# Patient Record
Sex: Male | Born: 1960 | Race: White | Hispanic: No | Marital: Married | Smoking: Never smoker
Health system: Southern US, Community
[De-identification: ages and names within clinical notes are randomized; demographics above are authoritative.]

## PROBLEM LIST (undated history)

## (undated) DIAGNOSIS — Z5189 Encounter for other specified aftercare: Secondary | ICD-10-CM

## (undated) DIAGNOSIS — Z87442 Personal history of urinary calculi: Secondary | ICD-10-CM

## (undated) DIAGNOSIS — M1990 Unspecified osteoarthritis, unspecified site: Secondary | ICD-10-CM

## (undated) DIAGNOSIS — S060XAA Concussion with loss of consciousness status unknown, initial encounter: Secondary | ICD-10-CM

## (undated) DIAGNOSIS — K5790 Diverticulosis of intestine, part unspecified, without perforation or abscess without bleeding: Secondary | ICD-10-CM

## (undated) DIAGNOSIS — N200 Calculus of kidney: Secondary | ICD-10-CM

## (undated) DIAGNOSIS — K219 Gastro-esophageal reflux disease without esophagitis: Secondary | ICD-10-CM

## (undated) DIAGNOSIS — T8859XA Other complications of anesthesia, initial encounter: Secondary | ICD-10-CM

## (undated) DIAGNOSIS — I639 Cerebral infarction, unspecified: Secondary | ICD-10-CM

## (undated) DIAGNOSIS — D649 Anemia, unspecified: Secondary | ICD-10-CM

## (undated) DIAGNOSIS — K254 Chronic or unspecified gastric ulcer with hemorrhage: Secondary | ICD-10-CM

## (undated) DIAGNOSIS — T7840XA Allergy, unspecified, initial encounter: Secondary | ICD-10-CM

## (undated) HISTORY — DX: Encounter for other specified aftercare: Z51.89

## (undated) HISTORY — PX: UPPER GASTROINTESTINAL ENDOSCOPY: SHX188

## (undated) HISTORY — DX: Gastro-esophageal reflux disease without esophagitis: K21.9

## (undated) HISTORY — PX: COLONOSCOPY: SHX174

## (undated) HISTORY — PX: KIDNEY STONE SURGERY: SHX686

## (undated) HISTORY — DX: Diverticulosis of intestine, part unspecified, without perforation or abscess without bleeding: K57.90

## (undated) HISTORY — PX: APPENDECTOMY: SHX54

## (undated) HISTORY — PX: OTHER SURGICAL HISTORY: SHX169

## (undated) HISTORY — PX: OPEN ANTERIOR SHOULDER RECONSTRUCTION: SHX2100

## (undated) HISTORY — PX: FINGER FRACTURE SURGERY: SHX638

## (undated) HISTORY — PX: WISDOM TOOTH EXTRACTION: SHX21

## (undated) HISTORY — DX: Allergy, unspecified, initial encounter: T78.40XA

---

## 1990-10-07 DIAGNOSIS — Z9289 Personal history of other medical treatment: Secondary | ICD-10-CM

## 1990-10-07 HISTORY — DX: Personal history of other medical treatment: Z92.89

## 2009-08-07 HISTORY — PX: NASAL SINUS SURGERY: SHX719

## 2009-10-07 DIAGNOSIS — I639 Cerebral infarction, unspecified: Secondary | ICD-10-CM

## 2009-10-07 HISTORY — DX: Cerebral infarction, unspecified: I63.9

## 2015-08-21 DIAGNOSIS — N200 Calculus of kidney: Secondary | ICD-10-CM | POA: Insufficient documentation

## 2016-05-23 ENCOUNTER — Encounter: Payer: Self-pay | Admitting: *Deleted

## 2016-05-23 ENCOUNTER — Emergency Department
Admission: EM | Admit: 2016-05-23 | Discharge: 2016-05-23 | Disposition: A | Discharge status: Home / Self Care | Payer: BLUE CROSS/BLUE SHIELD | Source: Home / Self Care

## 2016-05-23 DIAGNOSIS — L740 Miliaria rubra: Secondary | ICD-10-CM

## 2016-05-23 DIAGNOSIS — R21 Rash and other nonspecific skin eruption: Secondary | ICD-10-CM | POA: Diagnosis not present

## 2016-05-23 HISTORY — DX: Calculus of kidney: N20.0

## 2016-05-23 HISTORY — DX: Chronic or unspecified gastric ulcer with hemorrhage: K25.4

## 2016-05-23 HISTORY — DX: Cerebral infarction, unspecified: I63.9

## 2016-05-23 MED ORDER — PREDNISONE 20 MG PO TABS: ORAL_TABLET | ORAL | 0 refills | Status: DC

## 2016-05-23 MED ORDER — ERYTHROMYCIN 2 % EX OINT: TOPICAL_OINTMENT | CUTANEOUS | 0 refills | Status: DC

## 2016-05-23 NOTE — ED Provider Notes (Signed)
CSN: [id number]     Arrival date & time 05/23/16  0804 History   First MD Initiated Contact with Patient 05/23/16 [phone]     Chief Complaint  Patient presents with  . Rash   (Consider location/radiation/quality/duration/timing/severity/associated sxs/prior Treatment) HPI  Tyler Obrien is a 55 y.o. male presenting to UC with c/o mild to moderately pruritic rash that started on his trunk about 9 days ago.  He reports hx of similar rash on trunk in the past, usually resolves with OTC Gold Bond powder and benadryl but no relief this time. Reports mild intermittent headache but denies n/v/d. Denies fever or chills. No new soaps lotions or medications. He notes the rash is typically triggered by very hot or very dry months of the year.    Past Medical History:  Diagnosis Date  . Bleeding gastric ulcer   . CVA (cerebral infarction)   . Kidney stone    Past Surgical History:  Procedure Laterality Date  . APPENDECTOMY    . KIDNEY STONE SURGERY    . OPEN ANTERIOR SHOULDER RECONSTRUCTION     Family History  Problem Relation Age of Onset  . Cancer Father     Lung CA  . Hyperlipidemia Father   . Hypertension Father    Social History  Substance Use Topics  . Smoking status: Never Smoker  . Smokeless tobacco: Never Used  . Alcohol use Yes    Review of Systems  Constitutional: Negative for chills and fever.  Gastrointestinal: Negative for nausea and vomiting.  Musculoskeletal: Negative for arthralgias, joint swelling and myalgias.  Skin: Positive for rash. Negative for wound.    Allergies  Review of patient's allergies indicates no known allergies.  Home Medications   Prior to Admission medications   Medication Sig Start Date End Date Taking? Authorizing Provider  fluticasone (FLONASE) 50 MCG/ACT nasal spray Place into both nostrils daily.   Yes Historical Provider, MD  Multiple Vitamin (MULTIVITAMIN) tablet Take 1 tablet by mouth daily.   Yes Historical Provider, MD   Erythromycin 2 % ointment Apply to affected area 2 times daily 05/23/16   Junius FinnerErin O'Malley, PA-C  predniSONE (DELTASONE) 20 MG tablet 3 tabs po day one, then 2 po daily x 4 days 05/23/16   Junius FinnerErin O'Malley, PA-C   Meds Ordered and Administered this Visit  Medications - No data to display  BP 133/91 (BP Location: Left Arm)   Pulse 73   Temp 97.8 F (36.6 C) (Oral)   Resp 14   Ht 5\' 9"  (1.753 m)   Wt 183 lb (83 kg)   SpO2 96%   BMI 27.02 kg/m  No data found.   Physical Exam  Constitutional: He is oriented to person, place, and time. He appears well-developed and well-nourished.  HENT:  Head: Normocephalic and atraumatic.  Mouth/Throat: Oropharynx is clear and moist.  Cardiovascular: Normal rate.   Pulmonary/Chest: Effort normal. No respiratory distress.  Musculoskeletal: Normal range of motion.  Neurological: He is alert and oriented to person, place, and time.  Skin: Skin is warm and dry. Rash noted. There is erythema.  Trunk and back: diffuse erythematous 3mm papular rash. Rash does blanch. No bleeding or discharge. Non-tender. No surrounding erythema, edema, induration or fluctuance.   Psychiatric: He has a normal mood and affect. His behavior is normal.  Nursing note and vitals reviewed.   Urgent Care Course   Clinical Course    Procedures (including critical care time)  Labs Review Labs Reviewed - No data to display  Imaging Review No results found.   MDM   1. Rash and nonspecific skin eruption   2. Heat rash    Rash most c/w heat rash.  Rx: Prednisone and erythromycin cream  Encouraged to keep rash clean with soap and water. Home instructions provided. F/u with PCP in 1 week if not improving, sooner if worsening. Patient verbalized understanding and agreement with treatment plan.     Junius FinnerErin O'Malley, PA-C 05/23/16 [id number]

## 2016-05-23 NOTE — ED Triage Notes (Signed)
Pt c/o pruritic rash to trunk x 9 days. H/o same rash in the past that resolved with otc meds, not working this time. USed gold bond powder and Benadryl. Also c/o a few HAs since rash started.

## 2016-08-26 ENCOUNTER — Ambulatory Visit (INDEPENDENT_AMBULATORY_CARE_PROVIDER_SITE_OTHER): Payer: BLUE CROSS/BLUE SHIELD | Admitting: Osteopathic Medicine

## 2016-08-26 ENCOUNTER — Encounter: Payer: Self-pay | Admitting: Osteopathic Medicine

## 2016-08-26 VITALS — BP 131/86 | HR 67 | Ht 69.0 in | Wt 173.0 lb

## 2016-08-26 DIAGNOSIS — R3989 Other symptoms and signs involving the genitourinary system: Secondary | ICD-10-CM | POA: Diagnosis not present

## 2016-08-26 DIAGNOSIS — N200 Calculus of kidney: Secondary | ICD-10-CM

## 2016-08-26 DIAGNOSIS — L729 Follicular cyst of the skin and subcutaneous tissue, unspecified: Secondary | ICD-10-CM | POA: Diagnosis not present

## 2016-08-26 DIAGNOSIS — R0981 Nasal congestion: Secondary | ICD-10-CM

## 2016-08-26 DIAGNOSIS — R718 Other abnormality of red blood cells: Secondary | ICD-10-CM

## 2016-08-26 DIAGNOSIS — J322 Chronic ethmoidal sinusitis: Secondary | ICD-10-CM

## 2016-08-26 DIAGNOSIS — Z13220 Encounter for screening for lipoid disorders: Secondary | ICD-10-CM

## 2016-08-26 DIAGNOSIS — E559 Vitamin D deficiency, unspecified: Secondary | ICD-10-CM

## 2016-08-26 MED ORDER — AMOXICILLIN-POT CLAVULANATE 875-125 MG PO TABS: 1.0000 | ORAL_TABLET | Freq: Two times a day (BID) | ORAL | 0 refills | Status: DC

## 2016-08-26 NOTE — Progress Notes (Signed)
HPI: Tyler Obrien is a 55 y.o. male  who presents to [facility] today, 08/26/16,  for chief complaint of:  Chief Complaint  Patient presents with  . Establish Care     Pleasant new patient here to establish care. Complains today include sinus pain and request for urology referral  Sinus pain: Small area on the right side of face, pressure and mucus production, no fever or chills. Patient has seasonal allergies, was using Allegra and Flonase initially, Xylitol spray few times per day as needed. History of sinus surgery.  Urologic: 2013 had 2 cysts removed from scrotum, one came back this summer. Patient was referral to different urologist. Also has history of kidney stone problems. Also reports of having some "bladder plain" issues which previous urologist ignored her patient  Available records reviewed: Labs for 1017, no concerns on CMP with the exception of slightly elevated uric acid at 6.9, creatinine 1.06 but GFR greater than 60, normal UA. 11/14/2015 CBC microcytosis but hemoglobin 12.2. HCV negative. Cholesterol borderline with LDL 186, PSA normal    Past medical, surgical, social and family history reviewed: There are no active problems to display for this patient.  Past Surgical History:  Procedure Laterality Date  . APPENDECTOMY    . KIDNEY STONE SURGERY    . OPEN ANTERIOR SHOULDER RECONSTRUCTION     Social History  Substance Use Topics  . Smoking status: Never Smoker  . Smokeless tobacco: Never Used  . Alcohol use Yes   Family History  Problem Relation Age of Onset  . Cancer Father     Lung CA  . Hyperlipidemia Father   . Hypertension Father      Current medication list and allergy/intolerance information reviewed:   Current Outpatient Prescriptions  Medication Sig Dispense Refill  . Ascorbic Acid (VITAMIN C) 100 MG tablet Take 100 mg by mouth.    Marland Kitchen aspirin EC 81 MG tablet Take 81 mg by mouth daily.    . fluticasone  (FLONASE) 50 MCG/ACT nasal spray Place into both nostrils daily.    . Multiple Vitamin (MULTIVITAMIN) tablet Take 1 tablet by mouth daily.    Marland Kitchen omeprazole (PRILOSEC) 20 MG capsule Take 20 mg by mouth.    Marland Kitchen amoxicillin-clavulanate (AUGMENTIN) 875-125 MG tablet Take 1 tablet by mouth 2 (two) times daily. 14 tablet 0   No current facility-administered medications for this visit.    No Known Allergies    Review of Systems:  Constitutional:  No  fever, no chills, No recent illness, No unintentional weight changes. No significant fatigue.   HEENT: No  headache, no vision change, no hearing change, No sore throat, No  sinus pressure  Cardiac: No  chest pain, No  pressure, No palpitations, No  Orthopnea  Respiratory:  No  shortness of breath. No  Cough  Gastrointestinal: No  abdominal pain, No  nausea, No  vomiting,  No  blood in stool, No  diarrhea, No  constipation   Musculoskeletal: No new myalgia/arthralgia  Genitourinary: No  incontinence, No  abnormal genital bleeding, No abnormal genital discharge  Skin: No  Rash, No other wounds/concerning lesions  Hem/Onc: No  easy bruising/bleeding, No  abnormal lymph node  Endocrine: No cold intolerance,  No heat intolerance. No polyuria/polydipsia/polyphagia   Neurologic: No  weakness, No  dizziness, No  slurred speech/focal weakness/facial droop  Psychiatric: No  concerns with depression, No  concerns with anxiety, No sleep problems, No mood problems  Exam:  BP 131/86  Pulse 67   Ht 5\' 9"  (1.753 m)   Wt 173 lb (78.5 kg)   BMI 25.55 kg/m   Constitutional: VS see above. General Appearance: alert, well-developed, well-nourished, NAD  Eyes: Normal lids and conjunctive, non-icteric sclera  Ears, Nose, Mouth, Throat: MMM, Normal external inspection ears/nares/mouth/lips/gums. TM normal bilaterally. Pharynx/tonsils no erythema, no exudate. Nasal mucosa normal.   Neck: No masses, trachea midline. No thyroid enlargement. No  tenderness/mass appreciated. No lymphadenopathy  Respiratory: Normal respiratory effort. no wheeze, no rhonchi, no rales  Cardiovascular: S1/S2 normal, no murmur, no rub/gallop auscultated. RRR. No lower extremity edema. Pedal pulse II/IV bilaterally DP and PT. No carotid bruit or JVD. No abdominal aortic bruit.  Gastrointestinal: Nontender, no masses. No hepatomegaly, no splenomegaly. No hernia appreciated. Bowel sounds normal. Rectal exam deferred.   Musculoskeletal: Gait normal. No clubbing/cyanosis of digits.   Neurological: Normal balance/coordination. No tremor. No cranial nerve deficit on limited exam. Motor and sensation intact and symmetric. Cerebellar reflexes intact.   Skin: warm, dry, intact. No rash/ulcer. No concerning nevi or subq nodules on limited exam.    Psychiatric: Normal judgment/insight. Normal mood and affect. Oriented x3.    No results found for this or any previous visit (from the past 72 hour(s)).  No results found.   ASSESSMENT/PLAN:   Cyst of scrotum - Plan: Ambulatory referral to Urology  Sinus congestion - Plan: amoxicillin-clavulanate (AUGMENTIN) 875-125 MG tablet  Bladder pain - Plan: Ambulatory referral to Urology  Microcytosis - Plan: CBC with Differential/Platelet  Lipid screening - Plan: COMPLETE METABOLIC PANEL WITH GFR, Lipid panel  Vitamin D deficiency - Plan: VITAMIN D 25 Hydroxy (Vit-D Deficiency, Fractures)  Kidney stone - Plan: Uric acid  Ethmoid sinusitis, unspecified chronicity - Plan: amoxicillin-clavulanate (AUGMENTIN) 875-125 MG tablet   MALE PREVENTIVE CARE  updated 08/26/16  ANNUAL SCREENING/COUNSELING  Any changes to health in the past year? no  Diet/Exercise - HEALTHY HABITS DISCUSSED TO DECREASE CV RISK History  Smoking Status  . Never Smoker  Smokeless Tobacco  . Never Used   History  Alcohol Use  . Yes   No flowsheet data found.  SEXUAL/REPRODUCTIVE HEALTH  Sexually active in the past year? - Yes  with male.  STI testing needed/desired today? - no  Any concerns with testosterone/libido? - no  INFECTIOUS DISEASE SCREENING  HIV - does not need  GC/CT - does not need  HepC - does not need  TB - does not need  CANCER SCREENING  Lung - USPSTF: 55-80yo w/ 30 py hx unless quit w/in 3636yr - does not need  Colon - does not need - had EGD and Colonoscopy in 2009 or 2010, normal per patient   Prostate - does not need  OTHER DISEASE SCREENING  Lipid - needs  DM2 - needs  AAA - 65-75yo ever smoked: does not need  Osteoporosis - men 57yo+ - does not need  ADULT VACCINATION  Influenza - annual vaccine recommended - declined by patient  Td - booster every 10 years   Zoster - option at 5750, yes at 60+   PCV13 - was not indicated  PPSV23 - was not indicated  There is no immunization history on file for this patient.  OTHER  Fall - exercise and Vit D age 81+ - does not need  Consider ASA - age 57-59 - does not need      Visit summary with medication list and pertinent instructions was printed for patient to review. All questions at time of visit  were answered - patient instructed to contact office with any additional concerns. ER/RTC precautions were reviewed with the patient. Follow-up plan: [facility] about 1 year (around 08/26/2017[facility] sooner if needed.

## 2016-08-27 DIAGNOSIS — R3989 Other symptoms and signs involving the genitourinary system: Secondary | ICD-10-CM | POA: Insufficient documentation

## 2016-08-27 DIAGNOSIS — J322 Chronic ethmoidal sinusitis: Secondary | ICD-10-CM | POA: Insufficient documentation

## 2016-08-27 DIAGNOSIS — L729 Follicular cyst of the skin and subcutaneous tissue, unspecified: Secondary | ICD-10-CM | POA: Insufficient documentation

## 2016-08-27 DIAGNOSIS — N200 Calculus of kidney: Secondary | ICD-10-CM | POA: Insufficient documentation

## 2016-08-27 DIAGNOSIS — E559 Vitamin D deficiency, unspecified: Secondary | ICD-10-CM | POA: Insufficient documentation

## 2016-08-27 DIAGNOSIS — R718 Other abnormality of red blood cells: Secondary | ICD-10-CM | POA: Insufficient documentation

## 2016-09-06 ENCOUNTER — Telehealth: Payer: Self-pay | Admitting: Osteopathic Medicine

## 2016-09-06 LAB — CBC WITH DIFFERENTIAL/PLATELET
BASOS PCT: 1 %
Basophils Absolute: 55 cells/uL (ref 0–200)
Eosinophils Absolute: 220 cells/uL (ref 15–500)
Eosinophils Relative: 4 %
HEMATOCRIT: 43.8 % (ref 38.5–50.0)
Hemoglobin: 14.7 g/dL (ref 13.2–17.1)
LYMPHS PCT: 39 %
Lymphs Abs: 2145 cells/uL (ref 850–3900)
MCH: 29.3 pg (ref 27.0–33.0)
MCHC: 33.6 g/dL (ref 32.0–36.0)
MCV: 87.4 fL (ref 80.0–100.0)
MONO ABS: 440 {cells}/uL (ref 200–950)
MPV: 11.3 fL (ref 7.5–12.5)
Monocytes Relative: 8 %
NEUTROS ABS: 2640 {cells}/uL (ref 1500–7800)
Neutrophils Relative %: 48 %
PLATELETS: 176 10*3/uL (ref 140–400)
RBC: 5.01 MIL/uL (ref 4.20–5.80)
RDW: 14.9 % (ref 11.0–15.0)
WBC: 5.5 10*3/uL (ref 3.8–10.8)

## 2016-09-06 NOTE — Telephone Encounter (Signed)
Pt came in and stated he had completed his round antibiotic that you gave ghim last week but it did not get rid of sinus infection and would like to try another antibiotic. Thanks

## 2016-09-07 LAB — LIPID PANEL
Cholesterol: 234 mg/dL — ABNORMAL HIGH (ref <200)
HDL: 44 mg/dL (ref >40)
LDL CALC: 161 mg/dL — AB (ref <100)
Total CHOL/HDL Ratio: 5.3 Ratio — ABNORMAL HIGH (ref <5.0)
Triglycerides: 143 mg/dL (ref <150)
VLDL: 29 mg/dL (ref <30)

## 2016-09-07 LAB — COMPLETE METABOLIC PANEL WITH GFR
ALT: 15 U/L (ref 9–46)
AST: 23 U/L (ref 10–35)
Albumin: 4 g/dL (ref 3.6–5.1)
Alkaline Phosphatase: 59 U/L (ref 40–115)
BUN: 17 mg/dL (ref 7–25)
CALCIUM: 9.3 mg/dL (ref 8.6–10.3)
CHLORIDE: 104 mmol/L (ref 98–110)
CO2: 25 mmol/L (ref 20–31)
CREATININE: 1.09 mg/dL (ref 0.70–1.33)
GFR, Est African American: 87 mL/min (ref >=60)
GFR, Est Non African American: 75 mL/min (ref >=60)
Glucose, Bld: 92 mg/dL (ref 65–99)
Potassium: 4.5 mmol/L (ref 3.5–5.3)
Sodium: 138 mmol/L (ref 135–146)
TOTAL PROTEIN: 6.4 g/dL (ref 6.1–8.1)
Total Bilirubin: 0.4 mg/dL (ref 0.2–1.2)

## 2016-09-07 LAB — URIC ACID: URIC ACID, SERUM: 5.4 mg/dL (ref 4.0–8.0)

## 2016-09-07 LAB — VITAMIN D 25 HYDROXY (VIT D DEFICIENCY, FRACTURES): VIT D 25 HYDROXY: 55 ng/mL (ref 30–100)

## 2016-09-07 MED ORDER — DOXYCYCLINE MONOHYDRATE 50 MG PO CAPS: 100.0000 mg | ORAL_CAPSULE | Freq: Two times a day (BID) | ORAL | 0 refills | Status: DC

## 2016-09-07 NOTE — Telephone Encounter (Signed)
Pharmacy to cal lwhen new abx ready

## 2016-09-09 ENCOUNTER — Telehealth: Payer: Self-pay

## 2016-09-09 NOTE — Telephone Encounter (Signed)
Error

## 2016-09-09 NOTE — Telephone Encounter (Signed)
Please call patient, I sent in antibiotics for him over the weekend. Please make sure that he has picked these up, his pharmacy should have called him. If these antibiotics do not help symptoms, he will need CT of the sinuses.

## 2016-09-10 NOTE — Telephone Encounter (Signed)
Spoke to patient he stated that he picked up the medication on Saturday and he is feeling better. Ashaunti Treptow,CMA

## 2016-11-13 ENCOUNTER — Telehealth: Payer: Self-pay | Admitting: Osteopathic Medicine

## 2016-11-13 NOTE — Telephone Encounter (Signed)
Left patient a voicemail on 11/08/16 about receiving a flu shot.

## 2016-11-22 ENCOUNTER — Encounter: Payer: Self-pay | Admitting: *Deleted

## 2016-11-22 ENCOUNTER — Emergency Department
Admission: EM | Admit: 2016-11-22 | Discharge: 2016-11-22 | Disposition: A | Discharge status: Home / Self Care | Payer: BLUE CROSS/BLUE SHIELD | Source: Home / Self Care | Attending: Family Medicine | Admitting: Family Medicine

## 2016-11-22 DIAGNOSIS — J069 Acute upper respiratory infection, unspecified: Secondary | ICD-10-CM

## 2016-11-22 DIAGNOSIS — B9789 Other viral agents as the cause of diseases classified elsewhere: Secondary | ICD-10-CM | POA: Diagnosis not present

## 2016-11-22 MED ORDER — PREDNISONE 20 MG PO TABS: ORAL_TABLET | ORAL | 0 refills | Status: DC

## 2016-11-22 MED ORDER — AZITHROMYCIN 250 MG PO TABS: ORAL_TABLET | ORAL | 0 refills | Status: DC

## 2016-11-22 NOTE — ED Triage Notes (Signed)
Patient c/o dry cough body aches and sweats x yesterday. Taken Tylenol and mucinex otc. No flu vac this season.

## 2016-11-22 NOTE — Discharge Instructions (Signed)
Take plain guaifenesin (1200mg extended release tabs such as Mucinex) twice daily, with plenty of water, for cough and congestion.  May add Pseudoephedrine (30mg, one or two every 4 to 6 hours) for sinus congestion.  Get adequate rest.   °May use Afrin nasal spray (or generic oxymetazoline) twice daily for about 5 days and then discontinue.  Also recommend using saline nasal spray several times daily and saline nasal irrigation (AYR is a common brand).  Use Flonase nasal spray each morning after using Afrin nasal spray and saline nasal irrigation. °Try warm salt water gargles for sore throat.  °Stop all antihistamines for now, and other non-prescription cough/cold preparations. °May take Tylenol as needed for fever, headache, sore throat, etc.  °May take Delsym Cough Suppressant at bedtime for nighttime cough.  °Begin Azithromycin if not improving about one week or if persistent fever develops   °Follow-up with family doctor if not improving about10 days.  °

## 2016-11-22 NOTE — ED Provider Notes (Signed)
[facility]    CSN: [id number] Arrival date & time: 11/22/16  1001     History   Chief Complaint Chief Complaint  Patient presents with  . Cough  . Generalized Body Aches    HPI Camarion Weier is a 56 y.o. male.   Patient complains of onset of mild myalgias and fatigue about four days ago.  He developed non-productive cough yesterday, and sweats and headache last night. He has a past history of reactive airways disease, and notes that his colds tend to linger.  Family history of asthma:  Mother.   The history is provided by the patient.    Past Medical History:  Diagnosis Date  . Bleeding gastric ulcer   . CVA (cerebral infarction)   . Kidney stone     Patient Active Problem List   Diagnosis Date Noted  . Cyst of scrotum 08/27/2016  . Bladder pain 08/27/2016  . Microcytosis 08/27/2016  . Vitamin D deficiency 08/27/2016  . Kidney stone 08/27/2016  . Ethmoid sinusitis 08/27/2016    Past Surgical History:  Procedure Laterality Date  . APPENDECTOMY    . KIDNEY STONE SURGERY    . NASAL SINUS SURGERY  08/2009  . OPEN ANTERIOR SHOULDER RECONSTRUCTION         Home Medications    Prior to Admission medications   Medication Sig Start Date End Date Taking? Authorizing Provider  Ascorbic Acid (VITAMIN C) 100 MG tablet Take 100 mg by mouth.    Historical Provider, MD  aspirin EC 81 MG tablet Take 81 mg by mouth daily.    Historical Provider, MD  azithromycin (ZITHROMAX Z-PAK) 250 MG tablet Take 2 tabs today; then begin one tab once daily for 4 more days. (Rx void after 11/30/16) 11/22/16   Lattie Haw, MD  fluticasone [facility]) 50 MCG/ACT nasal spray Place into both nostrils daily.    Historical Provider, MD  Multiple Vitamin (MULTIVITAMIN) tablet Take 1 tablet by mouth daily.    Historical Provider, MD  omeprazole (PRILOSEC) 20 MG capsule Take 20 mg by mouth.    Historical Provider, MD  predniSONE (DELTASONE) 20 MG tablet Take one tab by mouth twice  daily for 5 days, then one daily for 3 days. Take with food. 11/22/16   Lattie Haw, MD    Family History Family History  Problem Relation Age of Onset  . Cancer Father     Lung CA  . Hyperlipidemia Father   . Hypertension Father     Social History Social History  Substance Use Topics  . Smoking status: Never Smoker  . Smokeless tobacco: Never Used  . Alcohol use Yes     Allergies   Nsaids   Review of Systems Review of Systems + minimal sore throat + cough No pleuritic pain No wheezing + nasal congestion + post-nasal drainage No sinus pain/pressure No itchy/red eyes No earache No hemoptysis No SOB No fever, + chills/sweats No nausea No vomiting No abdominal pain No diarrhea No urinary symptoms No skin rash + fatigue + myalgias + headache Used OTC meds without relief   Physical Exam Triage Vital Signs ED Triage Vitals  Enc Vitals Group     BP 11/22/16 1020 147/89     Pulse Rate 11/22/16 1020 83     Resp 11/22/16 1020 14     Temp 11/22/16 1020 98.5 F (36.9 C)     Temp Source 11/22/16 1020 Oral     SpO2 11/22/16 1020 96 %  Weight 11/22/16 1021 168 lb (76.2 kg)     Height --      Head Circumference --      Peak Flow --      Pain Score 11/22/16 1021 3     Pain Loc --      Pain Edu? --      Excl. in GC? --    No data found.   Updated Vital Signs BP 147/89 (BP Location: Left Arm)   Pulse 83   Temp 98.5 F (36.9 C) (Oral)   Resp 14   Wt 168 lb (76.2 kg)   SpO2 96%   BMI 24.81 kg/m   Visual Acuity Right Eye Distance:   Left Eye Distance:   Bilateral Distance:    Right Eye Near:   Left Eye Near:    Bilateral Near:     Physical Exam Nursing notes and Vital Signs reviewed. Appearance:  Patient appears stated age, and in no acute distress Eyes:  Pupils are equal, round, and reactive to light and accomodation.  Extraocular movement is intact.  Conjunctivae are not inflamed  Ears:  Canals normal.  Tympanic membranes normal.    Nose:  Mildly congested turbinates.  No sinus tenderness.   Pharynx:  Normal Neck:  Supple.  Tender enlarged posterior/lateral nodes are palpated bilaterally  Lungs:  Clear to auscultation.  Breath sounds are equal.  Moving air well. Heart:  Regular rate and rhythm without murmurs, rubs, or gallops.  Abdomen:  Nontender without masses or hepatosplenomegaly.  Bowel sounds are present.  No CVA or flank tenderness.  Extremities:  No edema.  Skin:  No rash present.    UC Treatments / Results  Labs (all labs ordered are listed, but only abnormal results are displayed) Labs Reviewed - No data to display  EKG  EKG Interpretation None       Radiology No results found.  Procedures Procedures (including critical care time)  Medications Ordered in UC Medications - No data to display   Initial Impression / Assessment and Plan / UC Course  I have reviewed the triage vital signs and the nursing notes.  Pertinent labs & imaging results that were available during my care of the patient were reviewed by me and considered in my medical decision making (see chart for details).    There is no evidence of bacterial infection today.   With patient's history of seasonal rhinitis, family history of asthma, and past history of viral URI's that linger, he probably has a predisposition to mild reactive airways disease.  Begin prednisone burst/taper. Take plain guaifenesin (1200mg  extended release tabs such as Mucinex) twice daily, with plenty of water, for cough and congestion.  May add Pseudoephedrine (30mg , one or two every 4 to 6 hours) for sinus congestion.  Get adequate rest.   May use Afrin nasal spray (or generic oxymetazoline) twice daily for about 5 days and then discontinue.  Also recommend using saline nasal spray several times daily and saline nasal irrigation (AYR is a common brand).  Use Flonase nasal spray each morning after using Afrin nasal spray and saline nasal irrigation. Try  warm salt water gargles for sore throat.  Stop all antihistamines for now, and other non-prescription cough/cold preparations. May take Tylenol as needed for fever, headache, sore throat, etc.  May take Delsym Cough Suppressant at bedtime for nighttime cough.  Begin Azithromycin if not improving about one week or if persistent fever develops (Given a prescription to hold, with an expiration date)  Follow-up with family doctor if not improving about10 days.     Final Clinical Impressions(s) / UC Diagnoses   Final diagnoses:  Viral URI with cough    New Prescriptions New Prescriptions   AZITHROMYCIN (ZITHROMAX Z-PAK) 250 MG TABLET    Take 2 tabs today; then begin one tab once daily for 4 more days. (Rx void after 11/30/16)   PREDNISONE (DELTASONE) 20 MG TABLET    Take one tab by mouth twice daily for 5 days, then one daily for 3 days. Take with food.     Lattie HawStephen A Elizabth Palka, MD 11/26/16 2153

## 2018-09-07 ENCOUNTER — Encounter: Payer: Self-pay | Admitting: Emergency Medicine

## 2018-09-07 ENCOUNTER — Telehealth: Payer: Self-pay | Admitting: Emergency Medicine

## 2018-09-07 ENCOUNTER — Emergency Department
Admission: EM | Admit: 2018-09-07 | Discharge: 2018-09-07 | Disposition: A | Discharge status: Home / Self Care | Payer: 59 | Source: Home / Self Care | Attending: Emergency Medicine | Admitting: Emergency Medicine

## 2018-09-07 ENCOUNTER — Other Ambulatory Visit: Payer: Self-pay | Admitting: Emergency Medicine

## 2018-09-07 DIAGNOSIS — J302 Other seasonal allergic rhinitis: Secondary | ICD-10-CM | POA: Diagnosis not present

## 2018-09-07 DIAGNOSIS — J309 Allergic rhinitis, unspecified: Secondary | ICD-10-CM

## 2018-09-07 MED ORDER — IPRATROPIUM BROMIDE 0.03 % NA SOLN: 2.0000 | Freq: Two times a day (BID) | NASAL | 12 refills | Status: DC

## 2018-09-07 MED ORDER — FEXOFENADINE HCL 60 MG PO TABS: 60.0000 mg | ORAL_TABLET | Freq: Two times a day (BID) | ORAL | 0 refills | Status: DC

## 2018-09-07 MED ORDER — METHYLPREDNISOLONE 4 MG PO TBPK: ORAL_TABLET | ORAL | 0 refills | Status: DC

## 2018-09-07 MED ORDER — FLUTICASONE PROPIONATE 50 MCG/ACT NA SUSP: NASAL | 0 refills | Status: DC

## 2018-09-07 NOTE — ED Provider Notes (Signed)
[facility]    CSN: [id number] Arrival date & time: 09/07/18  1412     History   Chief Complaint No chief complaint on file.   HPI Tyler Obrien is a 57 y.o. male.   HPI For the past 3 months complains of waxing and waning sinus and "cold symptoms", but worsening sinus pressure for 2 weeks.  Has watery eyes and itchy eyes at times without any matted discharge.  Occasional minimal nonproductive cough.  No definite wheezing or shortness of breath.  Denies fever or chills or nausea or vomiting.  He has had sinus surgery in 2010.  He is tried different OTC antihistamines, and he states Claritin and Zyrtec used to help but do not help anymore.  Allegra helps somewhat.  Benadryl helps but causes too much drowsiness. URI HISTORY  Tyler Obrien is a 57 y.o. male who complains of onset of cold symptoms for several days.  Have been using over-the-counter treatment which helps a little bit.  No chills/sweats No fever  +  Nasal congestion No discolored Post-nasal drainage +  sinus pain/pressure No sore throat  +  cough No wheezing No chest congestion No hemoptysis No shortness of breath No pleuritic pain  +  itchy/red eyes No earache  No nausea No vomiting No abdominal pain No diarrhea  No skin rashes +  Fatigue No myalgias No headache   Past Medical History:  Diagnosis Date  . Bleeding gastric ulcer   . CVA (cerebral infarction)   . Kidney stone     Patient Active Problem List   Diagnosis Date Noted  . Cyst of scrotum 08/27/2016  . Bladder pain 08/27/2016  . Microcytosis 08/27/2016  . Vitamin D deficiency 08/27/2016  . Kidney stone 08/27/2016  . Ethmoid sinusitis 08/27/2016    Past Surgical History:  Procedure Laterality Date  . APPENDECTOMY    . KIDNEY STONE SURGERY    . NASAL SINUS SURGERY  08/2009  . OPEN ANTERIOR SHOULDER RECONSTRUCTION         Home Medications    Prior to Admission medications   Medication Sig Start Date End Date  Taking? Authorizing Provider  Ascorbic Acid (VITAMIN C) 100 MG tablet Take 100 mg by mouth.    [provider]  aspirin EC 81 MG tablet Take 81 mg by mouth daily.    [provider]  fexofenadine (ALLEGRA) 60 MG tablet Take 1 tablet (60 mg total) by mouth 2 (two) times daily. 09/07/18   Lajean ManesMassey, David, MD  flunisolide (NASALIDE) 25 MCG/ACT (0.025%) SOLN 2 sprays each nostril trice daily. Any further RFs would need to be done by PCP. 09/09/18   Lajean ManesMassey, David, MD  ipratropium (ATROVENT) 0.03 % nasal spray Place 2 sprays into both nostrils every 12 (twelve) hours. 09/07/18   Lajean ManesMassey, David, MD  methylPREDNISolone (MEDROL DOSEPAK) 4 MG TBPK tablet Take as directed for 6 days 09/07/18   Lajean ManesMassey, David, MD  Multiple Vitamin (MULTIVITAMIN) tablet Take 1 tablet by mouth daily.    [provider]  omeprazole (PRILOSEC) 20 MG capsule Take 20 mg by mouth.    [provider]    Family History Family History  Problem Relation Age of Onset  . Cancer Father        Lung CA  . Hyperlipidemia Father   . Hypertension Father     Social History Social History   Tobacco Use  . Smoking status: Never Smoker  . Smokeless tobacco: Never Used  Substance Use Topics  .  Alcohol use: Yes  . Drug use: No     Allergies   Nsaids   Review of Systems Review of Systems Pertinent items noted in HPI and remainder of comprehensive ROS otherwise negative.   Physical Exam Triage Vital Signs ED Triage Vitals [09/07/18 1448]  Enc Vitals Group     BP (!) 156/95     Pulse Rate 83     Resp      Temp 97.8 F (36.6 C)     Temp Source Oral     SpO2 97 %     Weight 190 lb (86.2 kg)     Height      Head Circumference      Peak Flow      Pain Score 0     Pain Loc      Pain Edu?      Excl. in GC?    No data found.  Updated Vital Signs BP (!) 156/95 (BP Location: Right Arm)   Pulse 83   Temp 97.8 F (36.6 C) (Oral)   Wt 86.2 kg   SpO2 97%   BMI 28.06 kg/m   Visual  Acuity Right Eye Distance:   Left Eye Distance:   Bilateral Distance:    Right Eye Near:   Left Eye Near:    Bilateral Near:     Physical Exam  Constitutional: He is oriented to person, place, and time. He appears well-developed and well-nourished. No distress.  HENT:  Head: Normocephalic and atraumatic.  Right Ear: Tympanic membrane, external ear and ear canal normal.  Left Ear: Tympanic membrane, external ear and ear canal normal.  Nose: Mucosal edema (Pale, boggy turbinates) and rhinorrhea (Serous) present. No epistaxis. Right sinus exhibits maxillary sinus tenderness. Left sinus exhibits maxillary sinus tenderness.  Mouth/Throat: Oropharynx is clear and moist. No oral lesions. No oropharyngeal exudate.  Eyes: Right eye exhibits no discharge and no exudate. Left eye exhibits no discharge and no exudate. Right conjunctiva is not injected. Left conjunctiva is not injected. No scleral icterus.  Neck: Neck supple.  Cardiovascular: Normal rate, regular rhythm and normal heart sounds.  Pulmonary/Chest: Effort normal and breath sounds normal. He has no wheezes. He has no rales.  Lymphadenopathy:    He has no cervical adenopathy.  Neurological: He is alert and oriented to person, place, and time.  Skin: Skin is warm and dry.  Nursing note and vitals reviewed.    UC Treatments / Results  Labs (all labs ordered are listed, but only abnormal results are displayed) Labs Reviewed - No data to display  EKG None  Radiology No results found.  Procedures Procedures (including critical care time)  Medications Ordered in UC Medications - No data to display  Initial Impression / Assessment and Plan / UC Course  I have reviewed the triage vital signs and the nursing notes.  Pertinent labs & imaging results that were available during my care of the patient were reviewed by me and considered in my medical decision making (see chart for details).      Final Clinical Impressions(s) /  UC Diagnoses   Final diagnoses:  Seasonal allergic rhinitis, unspecified trigger  Allergic sinusitis  He likely has allergic sinusitis as cause for his symptoms.  No evidence of bacterial infection.  We discussed risk benefits alternatives and treatment options quite at length. Over 25 minutes spent, greater than 50% of the time spent for counseling and coordination of care. Prescriptions sent to pharmacy, see below.  verbal instructions given.  Questions invited and answered. He declined printing out AVS.  I explained the importance of establishing with PCP and following up in 7 days, sooner if worse or new symptoms.  PCP may choose to refer to ENT or allergist if needed. He voiced understanding.   Prescriptions -these are one-time prescriptions.  No further refills on these prescriptions.  He understands that PCP or ENT or allergist would need to refill any further if needed.    Medication Sig Dispense Auth. Provider   fluticasone (FLONASE) 50 MCG/ACT nasal spray  1 or 2 sprays each nostril twice a day 16 g Lajean Manes, MD   fexofenadine (ALLEGRA) 60 MG tablet   Take 1 tablet (60 mg total) by mouth 2 (two) times daily. For sinus allergies/allergic sinusitis 60 tablet Lajean Manes, MD   ipratropium (ATROVENT) 0.03 % nasal spray  Place 2 sprays into both nostrils every 12 (twelve) hours. 30 mL Lajean Manes, MD   methylPREDNISolone (MEDROL DOSEPAK) 4 MG TBPK tablet   Take as directed for 6 days 21 tablet Lajean Manes, MD       Lajean Manes, MD 09/09/18 Barry Brunner

## 2018-09-07 NOTE — ED Triage Notes (Signed)
Pt c/o cold sxs for the last few months. States in the last 2 weeks sinus pressure and fatigue have increased.

## 2018-09-07 NOTE — Telephone Encounter (Signed)
I saw patient earlier this evening and [facility] [place]. However, at time of visit, patient requested prescription sent [facility] [place]. Prescriptions were for Medrol Dosepak, Allegra, Flonase nasal spray and Atrovent nasal spray. Please call [facility] [place] to DC those prescriptions. Right now, I will E prescribe new prescriptions to [facility] [place].

## 2018-09-08 ENCOUNTER — Encounter: Payer: Self-pay | Admitting: Osteopathic Medicine

## 2018-09-08 DIAGNOSIS — Z0000 Encounter for general adult medical examination without abnormal findings: Secondary | ICD-10-CM

## 2018-09-09 NOTE — Telephone Encounter (Signed)
Any further RFs would need to be done by PCP.

## 2018-09-10 LAB — CBC
HEMATOCRIT: 45.5 % (ref 38.5–50.0)
HEMOGLOBIN: 15.2 g/dL (ref 13.2–17.1)
MCH: 28 pg (ref 27.0–33.0)
MCHC: 33.4 g/dL (ref 32.0–36.0)
MCV: 83.8 fL (ref 80.0–100.0)
MPV: 11.8 fL (ref 7.5–12.5)
Platelets: 217 10*3/uL (ref 140–400)
RBC: 5.43 10*6/uL (ref 4.20–5.80)
RDW: 15.3 % — ABNORMAL HIGH (ref 11.0–15.0)
WBC: 12.5 10*3/uL — ABNORMAL HIGH (ref 3.8–10.8)

## 2018-09-10 LAB — COMPREHENSIVE METABOLIC PANEL
AG Ratio: 1.4 (calc) (ref 1.0–2.5)
ALT: 20 U/L (ref 9–46)
AST: 20 U/L (ref 10–35)
Albumin: 4.2 g/dL (ref 3.6–5.1)
Alkaline phosphatase (APISO): 64 U/L (ref 40–115)
BILIRUBIN TOTAL: 0.4 mg/dL (ref 0.2–1.2)
BUN / CREAT RATIO: 14 (calc) (ref 6–22)
BUN: 19 mg/dL (ref 7–25)
CALCIUM: 9.6 mg/dL (ref 8.6–10.3)
CHLORIDE: 102 mmol/L (ref 98–110)
CO2: 31 mmol/L (ref 20–32)
Creat: 1.37 mg/dL — ABNORMAL HIGH (ref 0.70–1.33)
GLOBULIN: 2.9 g/dL (ref 1.9–3.7)
GLUCOSE: 93 mg/dL (ref 65–99)
POTASSIUM: 5.1 mmol/L (ref 3.5–5.3)
SODIUM: 137 mmol/L (ref 135–146)
TOTAL PROTEIN: 7.1 g/dL (ref 6.1–8.1)

## 2018-09-10 LAB — LIPID PANEL
CHOLESTEROL: 281 mg/dL — AB (ref <200)
HDL: 64 mg/dL (ref >40)
LDL Cholesterol (Calc): 193 mg/dL (calc) — ABNORMAL HIGH
Non-HDL Cholesterol (Calc): 217 mg/dL (calc) — ABNORMAL HIGH (ref <130)
Total CHOL/HDL Ratio: 4.4 (calc) (ref <5.0)
Triglycerides: 117 mg/dL (ref <150)

## 2018-09-10 LAB — PSA: PSA: 0.5 ng/mL (ref <=4.0)

## 2018-09-21 ENCOUNTER — Encounter: Payer: Self-pay | Admitting: Osteopathic Medicine

## 2018-09-21 ENCOUNTER — Ambulatory Visit: Payer: 59 | Admitting: Osteopathic Medicine

## 2018-09-21 VITALS — BP 117/72 | HR 98 | Temp 98.0°F | Wt 186.6 lb

## 2018-09-21 DIAGNOSIS — J3089 Other allergic rhinitis: Secondary | ICD-10-CM

## 2018-09-21 DIAGNOSIS — Z23 Encounter for immunization: Secondary | ICD-10-CM

## 2018-09-21 DIAGNOSIS — Z0000 Encounter for general adult medical examination without abnormal findings: Secondary | ICD-10-CM | POA: Diagnosis not present

## 2018-09-21 DIAGNOSIS — R5382 Chronic fatigue, unspecified: Secondary | ICD-10-CM | POA: Diagnosis not present

## 2018-09-21 MED ORDER — OLOPATADINE HCL 0.1 % OP SOLN: 1.0000 [drp] | Freq: Two times a day (BID) | OPHTHALMIC | 11 refills | Status: DC

## 2018-09-21 NOTE — Patient Instructions (Signed)
Allergies Even fairly severe allergies can typically be treated successfully with over-the-counter medications. I usually will recommend a combination of: . steroid nasal spray such as Flonase or Nasonex or one of their generic equivalents, PLUS... . an antihistamine such as Allegra, Zyrtec, or Claritin or one of their generic equivalents.  . Can also add a decongestant, either Sudafed on its own OR any of the antihistamines with the "-D" in the name that he has to show your ID to the pharmacist to buy.  . I sent Rx for eye allergies . I printed Rx for Singulair allergy pill to take in evenings if the above measures aren't working  . I recommend avoiding Afrin nasal spray unless absolutely needed, and then using it only for 2 days to prevent rebound congestion when the medicine is stopped.  . If needed, can refer to allergist or ENT specialists            General Preventive Care  Most recent routine screening lipids/other labs: normal - cholesterol borderline and we should recheck in 6 months or so   Everyone should have blood pressure checked once per year.   Tobacco: don't! Please let me know if you need help quitting!  Alcohol: responsible moderation is ok for most adults - if you have concerns about your alcohol intake, please talk to me!   Exercise: as tolerated to reduce risk of cardiovascular disease and diabetes. Strength training will also prevent osteoporosis.   Mental health: if need for mental health care (medicines, counseling, other), or concerns about moods, please let me know!   Sexual health: if need for STD testing, or if concerns with libido/pain problems, please let me know!  Advanced Directive: Living Will and/or Healthcare Power of Attorney recommended for all adults, regardless of age or health.  Vaccines  Flu vaccine: recommended for almost everyone, every fall.   Shingles vaccine: Shingrix recommended after age 59. Let u sknow if you'd like this  vaccine.   Pneumonia vaccines: Prevnar and Pneumovax recommended after age 59, or sooner if certain medical conditions.  Tetanus booster: Tdap recommended every 10 years.  Cancer screenings   Colon cancer screening: will get you referred for colonoscopy   Prostate cancer screening: PSA blood test around age 59 - done this year and negative   Lung cancer screening: not needed if never smoker  Infection screenings . HIV: recommended screening at least once age 59-65 . Gonorrhea/Chlamydia: screening as needed . Hepatitis C: recommended for anyone born 221945-1967 . TB: certain at-risk populations, or depending on work requirements and/or travel history Other . Bone Density Test: recommended for men at age 59, sooner depending on risk factors . Abdominal Aortic Aneurysm: screening with ultrasound recommended once for men age 59-75 who have ever smoked - don't take up smoking!

## 2018-09-21 NOTE — Progress Notes (Signed)
HPI: Tyler Obrien is a 57 y.o. male who  has a past medical history of Bleeding gastric ulcer, CVA (cerebral infarction), and Kidney stone.  [facility] today, 09/21/18,  for chief complaint of: Annual Physical  Allergies    Patient here for annual physical / wellness exam.  See preventive care reviewed as below.  Recent labs reviewed in detail, no concerns  Additional concerns today include:  Allergy issues: Has tried Allegra, this did not seem to help very much.  Has tried nasal sprays over-the-counter, helped somewhat.Tyler Obrien. Tyler Obrien a bit out of the ordinary, recent labs did not check thyroid     Past medical, surgical, social and family history reviewed:  Patient Active Problem List   Diagnosis Date Noted  . Cyst of scrotum 08/27/2016  . Bladder pain 08/27/2016  . Microcytosis 08/27/2016  . Vitamin D deficiency 08/27/2016  . Kidney stone 08/27/2016  . Ethmoid sinusitis 08/27/2016    Past Surgical History:  Procedure Laterality Date  . APPENDECTOMY    . KIDNEY STONE SURGERY    . NASAL SINUS SURGERY  08/2009  . OPEN ANTERIOR SHOULDER RECONSTRUCTION      Social History   Tobacco Use  . Smoking status: Never Smoker  . Smokeless tobacco: Never Used  Substance Use Topics  . Alcohol use: Yes    Family History  Problem Relation Age of Onset  . Cancer Father        Lung CA  . Hyperlipidemia Father   . Hypertension Father      Current medication list and allergy/intolerance information reviewed:    Current Outpatient Medications  Medication Sig Dispense Refill  . Ascorbic Acid (VITAMIN C) 100 MG tablet Take 100 mg by mouth.    Marland Kitchen. aspirin EC 81 MG tablet Take 81 mg by mouth daily.    . fexofenadine (ALLEGRA) 60 MG tablet Take 1 tablet (60 mg total) by mouth 2 (two) times daily. 60 tablet 0  . ipratropium (ATROVENT) 0.03 % nasal spray Place 2 sprays into both nostrils every 12 (twelve) hours. 30 mL 12  . Multiple  Vitamin (MULTIVITAMIN) tablet Take 1 tablet by mouth daily.    Marland Kitchen. omeprazole (PRILOSEC) 20 MG capsule Take 20 mg by mouth.    . flunisolide (NASALIDE) 25 MCG/ACT (0.025%) SOLN 2 sprays each nostril trice daily. Any further RFs would need to be done by PCP. (Patient not taking: Reported on 09/21/2018) 25 mL 0  . methylPREDNISolone (MEDROL DOSEPAK) 4 MG TBPK tablet Take as directed for 6 days (Patient not taking: Reported on 09/21/2018) 21 tablet 0   No current facility-administered medications for this visit.     Allergies  Allergen Reactions  . Nsaids Other (See Comments)    ULCER      Review of Systems:  Constitutional:  No  fever, no chills, No recent illness, No unintentional weight changes. No significant fatigue.   HEENT: No  headache, no vision change, no hearing change, No sore throat, +sinus pressure  Cardiac: No  chest pain, No  pressure, No palpitations, No  Orthopnea  Respiratory:  No  shortness of breath. No  Cough  Gastrointestinal: No  abdominal pain, No  nausea, No  vomiting,  No  blood in stool, No  diarrhea, No  constipation   Musculoskeletal: No new myalgia/arthralgia  Skin: No  Rash, No other wounds/concerning lesions  Genitourinary: No  incontinenc  Hem/Onc: No  easy bruising/bleeding  Neurologic: No  weakness, No  dizziness  Psychiatric: No  concerns with depression, No  concerns with anxiety, No sleep problems, No mood problems  Exam:  BP 117/72 (BP Location: Left Arm, Patient Position: Sitting, Cuff Size: Normal)   Pulse 98   Temp 98 F (36.7 C) (Oral)   Wt 186 lb 9.6 oz (84.6 kg)   BMI 27.56 kg/m   Constitutional: VS see above. General Appearance: alert, well-developed, well-nourished, NAD  Eyes: Normal lids and conjunctive, non-icteric sclera  Ears, Nose, Mouth, Throat: MMM, Normal external inspection ears/nares/mouth/lips/gums. TM normal bilaterally. Pharynx/tonsils no erythema, no exudate. Nasal mucosa pale.   Neck: No masses, trachea  midline. No thyroid enlargement. No tenderness/mass appreciated. No lymphadenopathy  Respiratory: Normal respiratory effort. no wheeze, no rhonchi, no rales  Cardiovascular: S1/S2 normal, no murmur, no rub/gallop auscultated. RRR. No lower extremity edema.   Gastrointestinal: Nontender, no masses. No hepatomegaly, no splenomegaly. No hernia appreciated. Bowel sounds normal. Rectal exam deferred.   Musculoskeletal: Gait normal. No clubbing/cyanosis of digits.   Neurological: Normal balance/coordination. No tremor. No cranial nerve deficit on limited exam. Motor and sensation intact and symmetric. Cerebellar reflexes intact.   Skin: warm, dry, intact. No rash/ulcer. No concerning nevi or subq nodules on limited exam.    Psychiatric: Normal judgment/insight. Normal mood and affect. Oriented x3.    Results for orders placed or performed in visit on 09/21/18 (from the past 72 hour(s))  TSH     Status: None   Collection Time: 09/21/18  3:28 PM  Result Value Ref Range   TSH 0.91 0.40 - 4.50 mIU/L  T4, free     Status: None   Collection Time: 09/21/18  3:28 PM  Result Value Ref Range   Free T4 1.4 0.8 - 1.8 ng/dL      ASSESSMENT/PLAN: The primary encounter diagnosis was Annual physical exam. Diagnoses of Need for Tdap vaccination, Chronic fatigue, and Non-seasonal allergic rhinitis, unspecified trigger were also pertinent to this visit.   Orders Placed This Encounter  Procedures  . Tdap vaccine greater than or equal to 7yo IM    Meds ordered this encounter  Medications  . olopatadine (PATANOL) 0.1 % ophthalmic solution    Sig: Place 1 drop into both eyes 2 (two) times daily. As needed for eye allergies    Dispense:  5 mL    Refill:  11    Patient Instructions  Allergies Even fairly severe allergies can typically be treated successfully with over-the-counter medications. I usually will recommend a combination of: . steroid nasal spray such as Flonase or Nasonex or one of their  generic equivalents, PLUS... . an antihistamine such as Allegra, Zyrtec, or Claritin or one of their generic equivalents.  . Can also add a decongestant, either Sudafed on its own OR any of the antihistamines with the "-D" in the name that he has to show your ID to the pharmacist to buy.  . I sent Rx for eye allergies . I printed Rx for Singulair allergy pill to take in evenings if the above measures aren't working  . I recommend avoiding Afrin nasal spray unless absolutely needed, and then using it only for 2 days to prevent rebound congestion when the medicine is stopped.  . If needed, can refer to allergist or ENT specialists            General Preventive Care  Most recent routine screening lipids/other labs: normal - cholesterol borderline and we should recheck in 6 months or so   Everyone should have blood  pressure checked once per year.   Tobacco: don't! Please let me know if you need help quitting!  Alcohol: responsible moderation is ok for most adults - if you have concerns about your alcohol intake, please talk to me!   Exercise: as tolerated to reduce risk of cardiovascular disease and diabetes. Strength training will also prevent osteoporosis.   Mental health: if need for mental health care (medicines, counseling, other), or concerns about moods, please let me know!   Sexual health: if need for STD testing, or if concerns with libido/pain problems, please let me know!  Advanced Directive: Living Will and/or Healthcare Power of Attorney recommended for all adults, regardless of age or health.  Vaccines  Flu vaccine: recommended for almost everyone, every fall.   Shingles vaccine: Shingrix recommended after age 31. Let u sknow if you'd like this vaccine.   Pneumonia vaccines: Prevnar and Pneumovax recommended after age 47, or sooner if certain medical conditions.  Tetanus booster: Tdap recommended every 10 years.  Cancer screenings   Colon cancer screening: will  get you referred for colonoscopy   Prostate cancer screening: PSA blood test around age 29 - done this year and negative   Lung cancer screening: not needed if never smoker  Infection screenings . HIV: recommended screening at least once age 87-65 . Gonorrhea/Chlamydia: screening as needed . Hepatitis C: recommended for anyone born 18-1967 . TB: certain at-risk populations, or depending on work requirements and/or travel history Other . Bone Density Test: recommended for men at age 66, sooner depending on risk factors . Abdominal Aortic Aneurysm: screening with ultrasound recommended once for men age 40-75 who have ever smoked - don't take up smoking!                      Visit summary with medication list and pertinent instructions was printed for patient to review. All questions at time of visit were answered - patient instructed to contact office with any additional concerns or updates. ER/RTC precautions were reviewed with the patient.   Note: Total time spent on problem-based portion of visit allergies 15  minutes, greater than 50% of the time was spent face-to-face counseling and coordinating care for the above diagnoses listed in assessment/plan.   Please note: voice recognition software was used to produce this document, and typos may escape review. Please contact Dr. Lyn Hollingshead for any needed clarifications.     Follow-up plan: Return in about 6 months (around 03/23/2019) for LAB VISIT ONLY recheck cholesterol. Otherwise annual check-up in one year .

## 2018-09-22 ENCOUNTER — Encounter: Payer: Self-pay | Admitting: Osteopathic Medicine

## 2018-09-22 LAB — T4, FREE: Free T4: 1.4 ng/dL (ref 0.8–1.8)

## 2018-09-22 LAB — TSH: TSH: 0.91 mIU/L (ref 0.40–4.50)

## 2018-10-06 ENCOUNTER — Telehealth: Payer: Self-pay

## 2018-10-06 MED ORDER — AZELASTINE HCL 0.05 % OP SOLN: 1.0000 [drp] | Freq: Two times a day (BID) | OPHTHALMIC | 11 refills | Status: DC

## 2018-10-06 MED ORDER — BENZONATATE 200 MG PO CAPS: 200.0000 mg | ORAL_CAPSULE | Freq: Two times a day (BID) | ORAL | 0 refills | Status: DC | PRN

## 2018-10-06 NOTE — Telephone Encounter (Signed)
Did he already start zyrtec/claritin and singulair that Dr. Mervyn SkeetersA sent? No fever?

## 2018-10-06 NOTE — Telephone Encounter (Signed)
Pt has been updated. Agrees with recommendation. Pt will pick up medications sent to pharmacy.

## 2018-10-06 NOTE — Telephone Encounter (Signed)
Pt called stating he is still having issues with allergies. He has develop a cough and eyes are bothered by allergies. Pt was informed by provider if no better to call in. Requesting an alternative med to be sent to local pharmacy. Pls advise.

## 2018-10-06 NOTE — Telephone Encounter (Signed)
Yes. He stated that the meds helped a bit, but now has a cough with eye allergies. No fever. Pls advise, thanks.

## 2018-10-06 NOTE — Telephone Encounter (Signed)
Looking over chart you recently had a prednisone pack as well. I will give you tessalon pearls for cough and switch eye drops to optivar. If allergies are persistent I think referral to allergist is needed to find the trigger and consider allergy shots.

## 2018-10-09 ENCOUNTER — Telehealth: Payer: Self-pay

## 2018-10-09 NOTE — Telephone Encounter (Signed)
Pt called stating that cough is not getting any better. Feels worse even with benzonatate rx. Tried calling pt twice to make an appt with provider for re-evaluation. No answer. No sound on pt's end or option to leave vm msg.

## 2019-01-21 ENCOUNTER — Other Ambulatory Visit: Payer: Self-pay | Admitting: Emergency Medicine

## 2019-02-11 ENCOUNTER — Encounter (HOSPITAL_BASED_OUTPATIENT_CLINIC_OR_DEPARTMENT_OTHER): Payer: Self-pay | Admitting: Emergency Medicine

## 2019-02-11 ENCOUNTER — Other Ambulatory Visit: Payer: Self-pay

## 2019-02-11 ENCOUNTER — Emergency Department (HOSPITAL_BASED_OUTPATIENT_CLINIC_OR_DEPARTMENT_OTHER)
Admission: EM | Admit: 2019-02-11 | Discharge: 2019-02-11 | Disposition: A | Discharge status: Home / Self Care | Payer: 59 | Attending: Emergency Medicine | Admitting: Emergency Medicine

## 2019-02-11 DIAGNOSIS — S6992XA Unspecified injury of left wrist, hand and finger(s), initial encounter: Secondary | ICD-10-CM | POA: Diagnosis present

## 2019-02-11 DIAGNOSIS — Z7982 Long term (current) use of aspirin: Secondary | ICD-10-CM | POA: Diagnosis not present

## 2019-02-11 DIAGNOSIS — W260XXA Contact with knife, initial encounter: Secondary | ICD-10-CM | POA: Insufficient documentation

## 2019-02-11 DIAGNOSIS — Y93G1 Activity, food preparation and clean up: Secondary | ICD-10-CM | POA: Insufficient documentation

## 2019-02-11 DIAGNOSIS — Y999 Unspecified external cause status: Secondary | ICD-10-CM | POA: Diagnosis not present

## 2019-02-11 DIAGNOSIS — Z79899 Other long term (current) drug therapy: Secondary | ICD-10-CM | POA: Diagnosis not present

## 2019-02-11 DIAGNOSIS — Y929 Unspecified place or not applicable: Secondary | ICD-10-CM | POA: Diagnosis not present

## 2019-02-11 DIAGNOSIS — S61217A Laceration without foreign body of left little finger without damage to nail, initial encounter: Secondary | ICD-10-CM | POA: Diagnosis not present

## 2019-02-11 MED ORDER — LIDOCAINE HCL (PF) 1 % IJ SOLN
30.0000 mL | Freq: Once | INTRAMUSCULAR | Status: DC
  Filled 2019-02-11: qty 30

## 2019-02-11 NOTE — ED Notes (Signed)
Gauze dressing placed by PA after suture repair of laceration.

## 2019-02-11 NOTE — ED Triage Notes (Signed)
Laceration to left 5th digit with a knife while cooking. States occurred 1 hour ago.

## 2019-02-11 NOTE — ED Provider Notes (Signed)
[facility] EMERGENCY DEPARTMENT Provider Note   CSN: [id number] Arrival date & time: 02/11/19  1942    History   Chief Complaint Chief Complaint  Patient presents with  . Laceration    HPI Tyler Obrien is a 58 y.o. male.     58 y/o male with no significant PMH presenting to the emergency department for laceration to his left little finger.  Patient reports that just prior to arrival he was trying to separate frozen burger patties with a knife and the knife slipped and cut his finger.  Reports bleeding.  Not on anticoagulation.  No other injuries.  Up-to-date on his tetanus shot.     Past Medical History:  Diagnosis Date  . Bleeding gastric ulcer   . CVA (cerebral infarction)   . Kidney stone     Patient Active Problem List   Diagnosis Date Noted  . Cyst of scrotum 08/27/2016  . Bladder pain 08/27/2016  . Microcytosis 08/27/2016  . Vitamin D deficiency 08/27/2016  . Kidney stone 08/27/2016  . Ethmoid sinusitis 08/27/2016    Past Surgical History:  Procedure Laterality Date  . APPENDECTOMY    . KIDNEY STONE SURGERY    . NASAL SINUS SURGERY  08/2009  . OPEN ANTERIOR SHOULDER RECONSTRUCTION          Home Medications    Prior to Admission medications   Medication Sig Start Date End Date Taking? Authorizing Provider  Ascorbic Acid (VITAMIN C) 100 MG tablet Take 100 mg by mouth.    [provider]  aspirin EC 81 MG tablet Take 81 mg by mouth daily.    [provider]  azelastine (OPTIVAR) 0.05 % ophthalmic solution Place 1 drop into both eyes 2 (two) times daily. 10/06/18   Breeback, Jade L, PA-C  benzonatate (TESSALON) 200 MG capsule Take 1 capsule (200 mg total) by mouth 2 (two) times daily as needed for cough. 10/06/18   Breeback, Lonna Cobb, PA-C  fexofenadine (ALLEGRA) 60 MG tablet Take 1 tablet (60 mg total) by mouth 2 (two) times daily. 09/07/18   Lajean Manes, MD  flunisolide (NASALIDE) 25 MCG/ACT (0.025%) SOLN 2 sprays each  nostril trice daily. Any further RFs would need to be done by PCP. Patient not taking: Reported on 09/21/2018 09/09/18   Lajean Manes, MD  ipratropium (ATROVENT) 0.03 % nasal spray Place 2 sprays into both nostrils every 12 (twelve) hours. 09/07/18   Lajean Manes, MD  methylPREDNISolone (MEDROL DOSEPAK) 4 MG TBPK tablet Take as directed for 6 days Patient not taking: Reported on 09/21/2018 09/07/18   Lajean Manes, MD  Multiple Vitamin (MULTIVITAMIN) tablet Take 1 tablet by mouth daily.    [provider]  olopatadine (PATANOL) 0.1 % ophthalmic solution Place 1 drop into both eyes 2 (two) times daily. As needed for eye allergies 09/21/18   Sunnie Nielsen, DO  omeprazole (PRILOSEC) 20 MG capsule Take 20 mg by mouth.    [provider]    Family History Family History  Problem Relation Age of Onset  . Cancer Father        Lung CA  . Hyperlipidemia Father   . Hypertension Father   . Cancer Mother     Social History Social History   Tobacco Use  . Smoking status: Never Smoker  . Smokeless tobacco: Never Used  Substance Use Topics  . Alcohol use: Yes    Alcohol/week: 4.0 standard drinks    Types: 4 Standard drinks or equivalent per week  .  Drug use: No     Allergies   Nsaids   Review of Systems Review of Systems  Constitutional: Negative for fever.  Skin: Positive for wound.  Allergic/Immunologic: Negative for immunocompromised state.  Neurological: Negative for light-headedness.  Hematological: Does not bruise/bleed easily.     Physical Exam Updated Vital Signs Ht 5\' 9"  (1.753 m)   Wt 82.6 kg   BMI 26.88 kg/m   Physical Exam Vitals signs and nursing note reviewed.  Constitutional:      Appearance: Normal appearance.  HENT:     Head: Normocephalic.  Eyes:     Conjunctiva/sclera: Conjunctivae normal.  Pulmonary:     Effort: Pulmonary effort is normal.  Musculoskeletal:     Comments: Normal range of motion of the left hand and digits   Skin:    General: Skin is dry.     Capillary Refill: Capillary refill takes less than 2 seconds.     Comments: There is a 2 cm laceration to the left lateral proximal digit.  Neurological:     Mental Status: He is alert.  Psychiatric:        Mood and Affect: Mood normal.      ED Treatments / Results  Labs (all labs ordered are listed, but only abnormal results are displayed) Labs Reviewed - No data to display  EKG None  Radiology No results found.  Procedures .Marland Kitchen.Laceration Repair Date/Time: 02/11/2019 9:29 PM Performed by: Arlyn DunningMcLean, Janaki Exley A, PA-C Authorized by: Arlyn DunningMcLean, Sharmaine Bain A, PA-C   Consent:    Consent obtained:  Verbal   Consent given by:  Patient   Risks discussed:  Infection, need for additional repair, pain, poor cosmetic result and poor wound healing   Alternatives discussed:  No treatment and delayed treatment Universal protocol:    Procedure explained and questions answered to patient or proxy's satisfaction: yes     Relevant documents present and verified: yes     Test results available and properly labeled: yes     Imaging studies available: yes     Required blood products, implants, devices, and special equipment available: yes     Site/side marked: yes     Immediately prior to procedure, a time out was called: yes     Patient identity confirmed:  Verbally with patient Anesthesia (see MAR for exact dosages):    Anesthesia method:  Local infiltration   Local anesthetic:  Lidocaine 1% w/o epi Laceration details:    Location:  Finger   Finger location:  L small finger   Length (cm):  2 Repair type:    Repair type:  Simple Pre-procedure details:    Preparation:  Patient was prepped and draped in usual sterile fashion Exploration:    Hemostasis achieved with:  Direct pressure   Wound exploration: wound explored through full range of motion     Wound extent: no foreign bodies/material noted, no tendon damage noted and no underlying fracture noted      Contaminated: no   Treatment:    Area cleansed with:  Betadine and saline Skin repair:    Repair method:  Sutures   Suture size:  4-0   Suture material:  Nylon   Suture technique:  Simple interrupted   Number of sutures:  6 Approximation:    Approximation:  Close Post-procedure details:    Dressing:  Antibiotic ointment and non-adherent dressing   Patient tolerance of procedure:  Tolerated well, no immediate complications   (including critical care time)  Medications Ordered in ED  Medications  lidocaine (PF) (XYLOCAINE) 1 % injection 30 mL (has no administration in time range)     Initial Impression / Assessment and Plan / ED Course  I have reviewed the triage vital signs and the nursing notes.  Pertinent labs & imaging results that were available during my care of the patient were reviewed by me and considered in my medical decision making (see chart for details).        Based on review of vitals, medical screening exam, lab work and/or imaging, there does not appear to be an acute, emergent etiology for the patient's symptoms. Counseled pt on good return precautions and encouraged both PCP and ED follow-up as needed.  Prior to discharge, I also discussed incidental imaging findings with patient in detail and advised appropriate, recommended follow-up in detail.  Clinical Impression: 1. Laceration of left little finger without foreign body without damage to nail, initial encounter     Disposition: Discharge  Prior to providing a prescription for a controlled substance, I independently reviewed the patient's recent prescription history on the [place] Controlled Substance Reporting System. The patient had no recent or regular prescriptions and was deemed appropriate for a brief, less than 3 day prescription of narcotic for acute analgesia.  This note was prepared with assistance of Conservation officer, historic buildings. Occasional wrong-word or sound-a-like substitutions  may have occurred due to the inherent limitations of voice recognition software.   Final Clinical Impressions(s) / ED Diagnoses   Final diagnoses:  Laceration of left little finger without foreign body without damage to nail, initial encounter    ED Discharge Orders    None       Jeral Pinch 02/11/19 2130    Melene Plan, DO 02/11/19 2144

## 2019-02-11 NOTE — Discharge Instructions (Addendum)
Thank you for allowing me to care for you today. Please return to the emergency department if you have new or worsening symptoms. Take your medications as instructed.  ° °

## 2019-02-23 ENCOUNTER — Encounter: Payer: Self-pay | Admitting: Osteopathic Medicine

## 2019-02-23 ENCOUNTER — Encounter: Payer: 59 | Admitting: Sports Medicine

## 2019-02-23 ENCOUNTER — Ambulatory Visit (INDEPENDENT_AMBULATORY_CARE_PROVIDER_SITE_OTHER): Payer: 59 | Admitting: Osteopathic Medicine

## 2019-02-23 VITALS — BP 126/89 | HR 75 | Temp 98.3°F | Wt 184.7 lb

## 2019-02-23 DIAGNOSIS — Z4802 Encounter for removal of sutures: Secondary | ICD-10-CM

## 2019-02-23 DIAGNOSIS — G8929 Other chronic pain: Secondary | ICD-10-CM | POA: Diagnosis not present

## 2019-02-23 DIAGNOSIS — R0981 Nasal congestion: Secondary | ICD-10-CM | POA: Diagnosis not present

## 2019-02-23 DIAGNOSIS — M25511 Pain in right shoulder: Secondary | ICD-10-CM | POA: Diagnosis not present

## 2019-02-23 NOTE — Patient Instructions (Signed)
Plan:  Keep laceration clean, let me know if it's painful or red!   Trial Afrin for a few days (no more than 3-4 days) for nasal congestion  See printed info for shoulder exercises, I suspect a rotator cuff issue. If shoulder is not better or if it gets worse, I would recommend follow-up with one of our sports medicine specialists (Dr Denyse Amass or Dr. Cherylann Parr Dr. Karie Schwalbe) for further evaluation in 2-4 weeks. Just call our office and ask for an appointment for sports medicine!

## 2019-02-23 NOTE — Progress Notes (Signed)
HPI: Tyler Obrien is a 58 y.o. male who  has a past medical history of Bleeding gastric ulcer, CVA (cerebral infarction), and Kidney stone.  [facility] today, 02/23/19,  for chief complaint of:  Suture removal  Sinus congestion Shoulder pain    Suture removal needed after cut on his finger. Healing well, no complaints other than soreness.   Sinus congestion, allergies worse. Taking OTC meds with minimal relief.   R Shoulder pain, ongoing for several months, concerned about a rotator cuff issue.     At today's visit 02/23/19 ... PMH, PSH, FH reviewed and updated as needed.  Current medication list and allergy/intolerance hx reviewed and updated as needed. (See remainder of HPI, ROS, Phys Exam below)   Dg Shoulder Right  Result Date: 02/24/2019 CLINICAL DATA:  Shoulder pain EXAM: RIGHT SHOULDER - 2+ VIEW COMPARISON:  Left shoulder radiograph FINDINGS: Mild AC joint degenerative change. No fracture or malalignment. Right lung apex is clear IMPRESSION: Mild AC joint degenerative change Electronically Signed   By: Jasmine PangKim  Fujinaga M.D.   On: 02/24/2019 21:12   Dg Shoulder Left  Result Date: 02/24/2019 CLINICAL DATA:  Right shoulder pain EXAM: LEFT SHOULDER - 2+ VIEW COMPARISON:  Right shoulder radiograph FINDINGS: No fracture or malalignment. Mild AC joint degenerative change. Left lung apex is clear IMPRESSION: Mild AC joint degenerative change Electronically Signed   By: Jasmine PangKim  Fujinaga M.D.   On: 02/24/2019 21:11    No results found for this or any previous visit (from the past 72 hour(s)).        ASSESSMENT/PLAN: The primary encounter diagnosis was Visit for suture removal. Diagnoses of Chronic right shoulder pain and Sinus congestion were also pertinent to this visit.   No orders of the defined types were placed in this encounter.    No orders of the defined types were placed in this encounter.   Patient Instructions   Plan:  Keep laceration clean, let me know if it's painful or red!   Trial Afrin for a few days (no more than 3-4 days) for nasal congestion  See printed info for shoulder exercises, I suspect a rotator cuff issue. If shoulder is not better or if it gets worse, I would recommend follow-up with one of our sports medicine specialists (Dr Denyse Amassorey or Dr. Cherylann Parrhekkekandam aka Dr. Karie Schwalbe) for further evaluation in 2-4 weeks. Just call our office and ask for an appointment for sports medicine!       Follow-up plan: Return if symptoms worsen or fail to improve (sinuses see Dr A, shoulder pain see sports med).                                                 ################################################# ################################################# ################################################# #################################################    Current Meds  Medication Sig  . Ascorbic Acid (VITAMIN C) 100 MG tablet Take 100 mg by mouth.  Marland Kitchen. aspirin EC 81 MG tablet Take 81 mg by mouth daily.  . fexofenadine (ALLEGRA) 60 MG tablet Take 1 tablet (60 mg total) by mouth 2 (two) times daily.  . flunisolide (NASALIDE) 25 MCG/ACT (0.025%) SOLN 2 sprays each nostril trice daily. Any further RFs would need to be done by PCP.  Marland Kitchen. ipratropium (ATROVENT) 0.03 % nasal spray Place 2 sprays into both nostrils every 12 (twelve) hours.  .Marland Kitchen  Multiple Vitamin (MULTIVITAMIN) tablet Take 1 tablet by mouth daily.  Marland Kitchen olopatadine (PATANOL) 0.1 % ophthalmic solution Place 1 drop into both eyes 2 (two) times daily. As needed for eye allergies  . omeprazole (PRILOSEC) 20 MG capsule Take 20 mg by mouth.  . [DISCONTINUED] azelastine (OPTIVAR) 0.05 % ophthalmic solution Place 1 drop into both eyes 2 (two) times daily.    Allergies  Allergen Reactions  . Nsaids Other (See Comments)    ULCER       Review of Systems:  Constitutional: No recent illness  Cardiac: No   chest pain, No  pressure, No palpitations  Respiratory:  No  shortness of breath. No  Cough  Musculoskeletal: +new myalgia/arthralgia  Skin: No  Rash. Laceration on   Neurologic: No  weakness, No  Dizziness  Psychiatric: No  concerns with depression, No  concerns with anxiety  Exam:  BP 126/89 (BP Location: Left Arm, Patient Position: Sitting, Cuff Size: Normal)   Pulse 75   Temp 98.3 F (36.8 C) (Oral)   Wt 184 lb 11.2 oz (83.8 kg)   BMI 27.28 kg/m   Constitutional: VS see above. General Appearance: alert, well-developed, well-nourished, NAD  Neck: No masses, trachea midline.   Respiratory: Normal respiratory effort.  Musculoskeletal: Gait normal. Symmetric and independent movement of all extremities  Neurological: Normal balance/coordination. No tremor.  Skin: warm, dry. Laceration on L 5th digit healing appropriately   Psychiatric: Normal judgment/insight. Normal mood and affect. Oriented x3.   Procedure note: wound was cleaned with alcohol, sutures removed without difficuty, patient tolerated procedure well.    Visit summary with medication list and pertinent instructions was printed for patient to review, patient was advised to alert [place] if any updates are needed. All questions at time of visit were answered - patient instructed to contact office with any additional concerns. ER/RTC precautions were reviewed with the patient and understanding verbalized.    Please note: voice recognition software was used to produce this document, and typos may escape review. Please contact Dr. Lyn Hollingshead for any needed clarifications.    Follow up plan: Return if symptoms worsen or fail to improve (sinuses see Dr A, shoulder pain see sports med).

## 2019-02-24 ENCOUNTER — Ambulatory Visit (INDEPENDENT_AMBULATORY_CARE_PROVIDER_SITE_OTHER): Payer: 59

## 2019-02-24 ENCOUNTER — Ambulatory Visit (INDEPENDENT_AMBULATORY_CARE_PROVIDER_SITE_OTHER): Payer: 59 | Admitting: Sports Medicine

## 2019-02-24 ENCOUNTER — Encounter: Payer: Self-pay | Admitting: Sports Medicine

## 2019-02-24 ENCOUNTER — Other Ambulatory Visit: Payer: Self-pay

## 2019-02-24 DIAGNOSIS — M25511 Pain in right shoulder: Secondary | ICD-10-CM

## 2019-02-24 DIAGNOSIS — M25512 Pain in left shoulder: Secondary | ICD-10-CM | POA: Diagnosis not present

## 2019-02-24 DIAGNOSIS — G8929 Other chronic pain: Secondary | ICD-10-CM | POA: Diagnosis not present

## 2019-02-24 NOTE — Progress Notes (Signed)
Subjective:    CC: Right shoulder pain  HPI: This is a pleasant 58 year old male, for the past several years has had pain over the deltoid in his right shoulder, worse with abduction, overhead activities, waking him from sleep, he is really not had any treatment for it.  He does have a history of a left shoulder dislocation back in the 70s with reconstruction of some type.  Minimal discomfort.  Pain refers down to the elbow.  I reviewed the past medical history, family history, social history, surgical history, and allergies today and no changes were needed.  Please see the problem list section below in epic for further details.  Past Medical History: Past Medical History:  Diagnosis Date  . Bleeding gastric ulcer   . CVA (cerebral infarction)   . Kidney stone    Past Surgical History: Past Surgical History:  Procedure Laterality Date  . APPENDECTOMY    . KIDNEY STONE SURGERY    . NASAL SINUS SURGERY  08/2009  . OPEN ANTERIOR SHOULDER RECONSTRUCTION     Social History: Social History   Socioeconomic History  . Marital status: Married    Spouse name: Not on file  . Number of children: Not on file  . Years of education: Not on file  . Highest education level: Not on file  Occupational History  . Not on file  Social Needs  . Financial resource strain: Not on file  . Food insecurity:    Worry: Not on file    Inability: Not on file  . Transportation needs:    Medical: Not on file    Non-medical: Not on file  Tobacco Use  . Smoking status: Never Smoker  . Smokeless tobacco: Never Used  Substance and Sexual Activity  . Alcohol use: Yes    Alcohol/week: 4.0 standard drinks    Types: 4 Standard drinks or equivalent per week  . Drug use: No  . Sexual activity: Not on file  Lifestyle  . Physical activity:    Days per week: Not on file    Minutes per session: Not on file  . Stress: Not on file  Relationships  . Social connections:    Talks on phone: Not on file   Gets together: Not on file    Attends religious service: Not on file    Active member of club or organization: Not on file    Attends meetings of clubs or organizations: Not on file    Relationship status: Not on file  Other Topics Concern  . Not on file  Social History Narrative  . Not on file   Family History: Family History  Problem Relation Age of Onset  . Cancer Father        Lung CA  . Hyperlipidemia Father   . Hypertension Father   . Cancer Mother    Allergies: Allergies  Allergen Reactions  . Nsaids Other (See Comments)    ULCER   Medications: See med rec.  Review of Systems: No fevers, chills, night sweats, weight loss, chest pain, or shortness of breath.   Objective:    General: Well Developed, well nourished, and in no acute distress.  Neuro: Alert and oriented x3, extra-ocular muscles intact, sensation grossly intact.  HEENT: Normocephalic, atraumatic, pupils equal round reactive to light, neck supple, no masses, no lymphadenopathy, thyroid nonpalpable.  Skin: Warm and dry, no rashes. Cardiac: Regular rate and rhythm, no murmurs rubs or gallops, no lower extremity edema.  Respiratory: Clear to auscultation bilaterally.  Not using accessory muscles, speaking in full sentences. Right shoulder: Inspection reveals no abnormalities, atrophy or asymmetry. Palpation is normal with no tenderness over AC joint or bicipital groove. ROM is full in all planes. Rotator cuff strength normal weak to abduction, weak to internal rotation. Positive Neer and Hawkin's tests, empty can. Speeds and Yergason's tests normal. No labral pathology noted with negative Obrien's, negative crank, negative clunk, and good stability. Normal scapular function observed. No painful arc and no drop arm sign. No apprehension sign  Procedure: Real-time Ultrasound Guided injection of the right subacromial bursa Device: GE Logiq E  Verbal informed consent obtained.  Time-out conducted.  Noted  no overlying erythema, induration, or other signs of local infection.  Skin prepped in a sterile fashion.  Local anesthesia: Topical Ethyl chloride.  With sterile technique and under real time ultrasound guidance:  1 cc Kenalog 40, 1 cc lidocaine, 1 cc bupivacaine injected into the subacromial bursa taking care to avoid intratendinous injection. Completed without difficulty  Pain immediately resolved suggesting accurate placement of the medication.  Advised to call if fevers/chills, erythema, induration, drainage, or persistent bleeding.  Images permanently stored and available for review in the ultrasound unit.  Impression: Technically successful ultrasound guided injection.  Impression and Recommendations:    Right shoulder pain Impingement syndrome. Formal PT, x-rays, return to see me in a month.   ___________________________________________ Ihor Austin. Benjamin Stain, M.D., ABFM., CAQSM. Primary Care and Sports Medicine [facility]  Adjunct Professor of Family Medicine  [employer]

## 2019-02-24 NOTE — Assessment & Plan Note (Signed)
Impingement syndrome. Formal PT, x-rays, return to see me in a month.

## 2019-03-01 ENCOUNTER — Telehealth: Payer: 59 | Admitting: Physician Assistant

## 2019-03-01 ENCOUNTER — Encounter: Payer: Self-pay | Admitting: Physician Assistant

## 2019-03-01 DIAGNOSIS — R509 Fever, unspecified: Secondary | ICD-10-CM

## 2019-03-01 DIAGNOSIS — R52 Pain, unspecified: Secondary | ICD-10-CM

## 2019-03-01 NOTE — Progress Notes (Signed)
E-Visit for Corona Virus Screening  Based on your current symptoms, it seems unlikely that your symptoms are related to the Coronavirus.    I have provided a work note for the next 3 days, if any changes to your symptoms, submit an Evisit or follow up with your doctor.   You have been enrolled in MyChart Home Monitoring for COVID-19. Daily you will receive a questionnaire within the MyChart website. Our COVID-19 response team will be monitoring your responses daily.   Coronavirus disease 2019 (COVID-19) is a respiratory illness that can spread from person to person. The virus that causes COVID-19 is a new virus that was first identified in the country of [place] but is now found in multiple other countries and has spread to the [place].  Symptoms associated with the virus are mild to severe fever, cough, and shortness of breath. There is currently no vaccine to protect against COVID-19, and there is no specific antiviral treatment for the virus.   To be considered HIGH RISK for Coronavirus (COVID-19), you have to meet the following criteria:  . Traveled to [place], [place], [place], [place] or [place]; or in the [place] to [place], [place], [place], or [place]; and have fever, cough, and shortness of breath within the last 2 weeks of travel OR  . Been in close contact with a person diagnosed with COVID-19 within the last 2 weeks and have fever, cough, and shortness of breath  . IF YOU DO NOT MEET THESE CRITERIA, YOU ARE CONSIDERED LOW RISK FOR COVID-19.   It is vitally important that if you feel that you have an infection such as this virus or any other virus that you stay home and away from places where you may spread it to others.  You should self-quarantine for 14 days if you have symptoms that could potentially be coronavirus and avoid contact with people age 41 and older.     You may also take acetaminophen (Tylenol) as needed for fever.   Reduce your risk of any infection  by using the same precautions used for avoiding the common cold or flu:  Marland Kitchen Wash your hands often with soap and warm water for at least 20 seconds.  If soap and water are not readily available, use an alcohol-based hand sanitizer with at least 60% alcohol.  . If coughing or sneezing, cover your mouth and nose by coughing or sneezing into the elbow areas of your shirt or coat, into a tissue or into your sleeve (not your hands). . Avoid shaking hands with others and consider head nods or verbal greetings only. . Avoid touching your eyes, nose, or mouth with unwashed hands.  . Avoid close contact with people who are sick. . Avoid places or events with large numbers of people in one location, like concerts or sporting events. . Carefully consider travel plans you have or are making. . If you are planning any travel outside or inside the [place], visit the CDC's Travelers' Health webpage for the latest health notices. . If you have some symptoms but not all symptoms, continue to monitor at home and seek medical attention if your symptoms worsen. . If you are having a medical emergency, call 911.  HOME CARE . Only take medications as instructed by your medical team. . Drink plenty of fluids and get plenty of rest. . A steam or ultrasonic humidifier can help if you have congestion.   GET HELP RIGHT AWAY IF: . You develop worsening  fever. . You become short of breath . You cough up blood. . Your symptoms become more severe MAKE SURE YOU   Understand these instructions.  Will watch your condition.  Will get help right away if you are not doing well or get worse.  Your e-visit answers were reviewed by a board certified advanced clinical practitioner to complete your personal care plan.  Depending on the condition, your plan could have included both over the counter or prescription medications.  If there is a problem please reply once you have received a response from your provider. Your safety is  important to us.  If you have drug allergies check your prescription carefully.    You can use MyChart to ask questions about today's visit, request a non-urgent call back, or ask for a work or school excuse for 24 hours related to this e-Visit. If it has been greater than 24 hours you will need to follow up with your provider, or enter a new e-Visit to address those concerns. You will get an e-mail in the next two days asking about your experience.  I hope that your e-visit has been valuable and will speed your recovery. Thank you for using e-visits.   I have spent 7 min in completion and review of this note- Illa LevelSahar Alferd Obryant [facility]

## 2019-03-05 ENCOUNTER — Encounter: Payer: Self-pay | Admitting: Osteopathic Medicine

## 2019-03-24 ENCOUNTER — Ambulatory Visit (INDEPENDENT_AMBULATORY_CARE_PROVIDER_SITE_OTHER): Payer: 59

## 2019-03-24 ENCOUNTER — Telehealth: Payer: Self-pay

## 2019-03-24 ENCOUNTER — Other Ambulatory Visit: Payer: Self-pay

## 2019-03-24 ENCOUNTER — Ambulatory Visit (INDEPENDENT_AMBULATORY_CARE_PROVIDER_SITE_OTHER): Payer: 59 | Admitting: Sports Medicine

## 2019-03-24 ENCOUNTER — Encounter: Payer: Self-pay | Admitting: Sports Medicine

## 2019-03-24 DIAGNOSIS — M5030 Other cervical disc degeneration, unspecified cervical region: Secondary | ICD-10-CM | POA: Insufficient documentation

## 2019-03-24 DIAGNOSIS — M25511 Pain in right shoulder: Secondary | ICD-10-CM

## 2019-03-24 DIAGNOSIS — G8929 Other chronic pain: Secondary | ICD-10-CM

## 2019-03-24 MED ORDER — CYCLOBENZAPRINE HCL 10 MG PO TABS: ORAL_TABLET | ORAL | 0 refills | Status: DC

## 2019-03-24 NOTE — Assessment & Plan Note (Signed)
Starting Flexeril at bedtime. X-rays today. We will address this after we get his shoulder figured out.

## 2019-03-24 NOTE — Progress Notes (Signed)
Subjective:    CC: Recheck right shoulder  HPI: Tyler Obrien is a very pleasant 58 year old male, we been treating him for right shoulder rotator cuff dysfunction, he had a subacromial injection, formal physical therapy, which only continues to have pain over the deltoid, weakness and lack of range of motion with abduction.  He does have a bit of discomfort radiating around the medial scapula on the right.  Moderate, persistent.  No progressive weakness, nothing overtly radicular down to the hands or fingertips.  I reviewed the past medical history, family history, social history, surgical history, and allergies today and no changes were needed.  Please see the problem list section below in epic for further details.  Past Medical History: Past Medical History:  Diagnosis Date  . Bleeding gastric ulcer   . CVA (cerebral infarction)   . Kidney stone    Past Surgical History: Past Surgical History:  Procedure Laterality Date  . APPENDECTOMY    . KIDNEY STONE SURGERY    . NASAL SINUS SURGERY  08/2009  . OPEN ANTERIOR SHOULDER RECONSTRUCTION     Social History: Social History   Socioeconomic History  . Marital status: Married    Spouse name: Not on file  . Number of children: Not on file  . Years of education: Not on file  . Highest education level: Not on file  Occupational History  . Not on file  Social Needs  . Financial resource strain: Not on file  . Food insecurity    Worry: Not on file    Inability: Not on file  . Transportation needs    Medical: Not on file    Non-medical: Not on file  Tobacco Use  . Smoking status: Never Smoker  . Smokeless tobacco: Never Used  Substance and Sexual Activity  . Alcohol use: Yes    Alcohol/week: 4.0 standard drinks    Types: 4 Standard drinks or equivalent per week  . Drug use: No  . Sexual activity: Not on file  Lifestyle  . Physical activity    Days per week: Not on file    Minutes per session: Not on file  . Stress: Not on  file  Relationships  . Social Musicianconnections    Talks on phone: Not on file    Gets together: Not on file    Attends religious service: Not on file    Active member of club or organization: Not on file    Attends meetings of clubs or organizations: Not on file    Relationship status: Not on file  Other Topics Concern  . Not on file  Social History Narrative  . Not on file   Family History: Family History  Problem Relation Age of Onset  . Cancer Father        Lung CA  . Hyperlipidemia Father   . Hypertension Father   . Cancer Mother    Allergies: Allergies  Allergen Reactions  . Nsaids Other (See Comments)    ULCER   Medications: See med rec.  Review of Systems: No fevers, chills, night sweats, weight loss, chest pain, or shortness of breath.   Objective:    General: Well Developed, well nourished, and in no acute distress.  Neuro: Alert and oriented x3, extra-ocular muscles intact, sensation grossly intact.  HEENT: Normocephalic, atraumatic, pupils equal round reactive to light, neck supple, no masses, no lymphadenopathy, thyroid nonpalpable.  Skin: Warm and dry, no rashes. Cardiac: Regular rate and rhythm, no murmurs rubs or gallops, no lower  extremity edema.  Respiratory: Clear to auscultation bilaterally. Not using accessory muscles, speaking in full sentences.  Impression and Recommendations:    Right shoulder pain Persistent pain despite injection, physical therapy. I do suspect a supraspinatus tear and that he will need operative intervention. Proceeding with MRI. He is having trouble sleeping due to the discomfort, we are going to add Flexeril at that time.  DDD (degenerative disc disease), cervical Starting Flexeril at bedtime. X-rays today. We will address this after we get his shoulder figured out.   ___________________________________________ Gwen Her. Dianah Field, M.D., ABFM., CAQSM. Primary Care and Sports Medicine [facility]  [facility]  Adjunct Professor of [place] [employer]

## 2019-03-24 NOTE — Assessment & Plan Note (Signed)
Persistent pain despite injection, physical therapy. I do suspect a supraspinatus tear and that he will need operative intervention. Proceeding with MRI. He is having trouble sleeping due to the discomfort, we are going to add Flexeril at that time.

## 2019-03-28 ENCOUNTER — Other Ambulatory Visit: Payer: Self-pay

## 2019-03-28 ENCOUNTER — Ambulatory Visit (INDEPENDENT_AMBULATORY_CARE_PROVIDER_SITE_OTHER): Payer: 59

## 2019-03-28 DIAGNOSIS — M25511 Pain in right shoulder: Secondary | ICD-10-CM | POA: Diagnosis not present

## 2019-03-28 DIAGNOSIS — G8929 Other chronic pain: Secondary | ICD-10-CM | POA: Diagnosis not present

## 2019-03-29 ENCOUNTER — Encounter: Payer: Self-pay | Admitting: Sports Medicine

## 2019-03-29 ENCOUNTER — Ambulatory Visit (INDEPENDENT_AMBULATORY_CARE_PROVIDER_SITE_OTHER): Payer: 59 | Admitting: Sports Medicine

## 2019-03-29 DIAGNOSIS — M25511 Pain in right shoulder: Secondary | ICD-10-CM | POA: Diagnosis not present

## 2019-03-29 DIAGNOSIS — G8929 Other chronic pain: Secondary | ICD-10-CM

## 2019-03-29 DIAGNOSIS — M5030 Other cervical disc degeneration, unspecified cervical region: Secondary | ICD-10-CM

## 2019-03-29 NOTE — Assessment & Plan Note (Signed)
Continue conservative treatment, rehab exercises given. I do think his neck pain is a byproduct of abnormal shoulder biomechanics. The plan was to get his shoulder figured out first before aggressively tackling his neck. His x-rays did show multilevel DDD

## 2019-03-29 NOTE — Assessment & Plan Note (Signed)
Persistent impingement type symptoms, this is in spite of ultrasound-guided subacromial injection. MRI shows subscapularis tendinosis, AC arthritis, glenohumeral arthritis, and subacromial bursitis. He has failed physical therapy, in fact worsening. At this point I think he is a candidate for arthroscopic subacromial decompression, referral to Dr. Griffin Basil.

## 2019-03-29 NOTE — Progress Notes (Signed)
Subjective:    CC: Follow-up  HPI: Tyler Obrien returns, he is a pleasant 58 year old male with impingement syndrome of the right shoulder, he has had a subacromial injection, physical therapy, got little bit better with the injection but therapy seem to worsen his symptoms.  We obtained an MRI the results of which we dictated below.  Pain is moderate, persistent, localized without radiation.  Pain does continue to bother him at night.  In addition he is had pain in his neck with radiation around the trapezius and periscapular region on the right, x-rays showed multilevel DDD, the plan initially was to get his shoulder figured out before we aggressively intervene with the neck.  I reviewed the past medical history, family history, social history, surgical history, and allergies today and no changes were needed.  Please see the problem list section below in epic for further details.  Past Medical History: Past Medical History:  Diagnosis Date  . Bleeding gastric ulcer   . CVA (cerebral infarction)   . Kidney stone    Past Surgical History: Past Surgical History:  Procedure Laterality Date  . APPENDECTOMY    . KIDNEY STONE SURGERY    . NASAL SINUS SURGERY  08/2009  . OPEN ANTERIOR SHOULDER RECONSTRUCTION     Social History: Social History   Socioeconomic History  . Marital status: Married    Spouse name: Not on file  . Number of children: Not on file  . Years of education: Not on file  . Highest education level: Not on file  Occupational History  . Not on file  Social Needs  . Financial resource strain: Not on file  . Food insecurity    Worry: Not on file    Inability: Not on file  . Transportation needs    Medical: Not on file    Non-medical: Not on file  Tobacco Use  . Smoking status: Never Smoker  . Smokeless tobacco: Never Used  Substance and Sexual Activity  . Alcohol use: Yes    Alcohol/week: 4.0 standard drinks    Types: 4 Standard drinks or equivalent per week  .  Drug use: No  . Sexual activity: Not on file  Lifestyle  . Physical activity    Days per week: Not on file    Minutes per session: Not on file  . Stress: Not on file  Relationships  . Social Herbalist on phone: Not on file    Gets together: Not on file    Attends religious service: Not on file    Active member of club or organization: Not on file    Attends meetings of clubs or organizations: Not on file    Relationship status: Not on file  Other Topics Concern  . Not on file  Social History Narrative  . Not on file   Family History: Family History  Problem Relation Age of Onset  . Cancer Father        Lung CA  . Hyperlipidemia Father   . Hypertension Father   . Cancer Mother    Allergies: Allergies  Allergen Reactions  . Nsaids Other (See Comments)    ULCER   Medications: See med rec.  Review of Systems: No fevers, chills, night sweats, weight loss, chest pain, or shortness of breath.   Objective:    General: Well Developed, well nourished, and in no acute distress.  Neuro: Alert and oriented x3, extra-ocular muscles intact, sensation grossly intact.  HEENT: Normocephalic, atraumatic, pupils equal  round reactive to light, neck supple, no masses, no lymphadenopathy, thyroid nonpalpable.  Skin: Warm and dry, no rashes. Cardiac: Regular rate and rhythm, no murmurs rubs or gallops, no lower extremity edema.  Respiratory: Clear to auscultation bilaterally. Not using accessory muscles, speaking in full sentences.  Shoulder MRI shows multiple pathologic changes including subscapularis tendinopathy, acromioclavicular osteoarthritis, glenohumeral osteoarthritis, as well as subacromial bursitis.  Impression and Recommendations:    Right shoulder pain Persistent impingement type symptoms, this is in spite of ultrasound-guided subacromial injection. MRI shows subscapularis tendinosis, AC arthritis, glenohumeral arthritis, and subacromial bursitis. He has failed  physical therapy, in fact worsening. At this point I think he is a candidate for arthroscopic subacromial decompression, referral to Dr. Everardo PacificVarkey.  DDD (degenerative disc disease), cervical Continue conservative treatment, rehab exercises given. I do think his neck pain is a byproduct of abnormal shoulder biomechanics. The plan was to get his shoulder figured out first before aggressively tackling his neck. His x-rays did show multilevel DDD   ___________________________________________ Ihor Austinhomas J. Benjamin Stainhekkekandam, M.D., ABFM., CAQSM. Primary Care and Sports Medicine [facility]  Adjunct Professor of Family Medicine  [employer]

## 2019-03-30 ENCOUNTER — Ambulatory Visit: Payer: 59 | Admitting: Sports Medicine

## 2019-08-13 ENCOUNTER — Ambulatory Visit (INDEPENDENT_AMBULATORY_CARE_PROVIDER_SITE_OTHER): Payer: 59 | Admitting: Family Medicine

## 2019-08-13 ENCOUNTER — Encounter: Payer: Self-pay | Admitting: Family Medicine

## 2019-08-13 VITALS — Temp 98.2°F

## 2019-08-13 DIAGNOSIS — J0120 Acute ethmoidal sinusitis, unspecified: Secondary | ICD-10-CM

## 2019-08-13 DIAGNOSIS — Z1211 Encounter for screening for malignant neoplasm of colon: Secondary | ICD-10-CM

## 2019-08-13 MED ORDER — AZITHROMYCIN 250 MG PO TABS: ORAL_TABLET | ORAL | 0 refills | Status: AC

## 2019-08-13 NOTE — Progress Notes (Signed)
Virtual Visit via Video Note  I connected with Tyler Obrien on 08/13/19 at  3:40 PM EST by a video enabled telemedicine application and verified that I am speaking with the correct person using two identifiers.   I discussed the limitations of evaluation and management by telemedicine and the availability of in person appointments. The patient expressed understanding and agreed to proceed.  Subjective:    CC: Sinus sxs  HPI: Has had allergies sxs this fall. . Left pressure an fullness in the left sinus x 2-3 weeks. Has been fatigued. No fever, chills or sweats.  + post nasal drip and throat is irritated. No ear pain. Eyes are watering. Using nasal steroid spray and eye drops. Has tried Claritin.  One day so loose stools.  Occ HA.  No cough or SOB.  No recent antibiotic use. No known exposure to COVID. Has been using Flonase. Stopped Allegra bc was too drying. Has been using allergy eye drops as well.  No loss of taste or smell. No ear pain.     Past medical history, Surgical history, Family history not pertinant except as noted below, Social history, Allergies, and medications have been entered into the medical record, reviewed, and corrections made.   Review of Systems: No fevers, chills, night sweats, weight loss, chest pain, or shortness of breath.   Objective:    General: Speaking clearly in complete sentences without any shortness of breath.  Alert and oriented x3.  Normal judgment. No apparent acute distress.     Impression and Recommendations:    Acute left sinusitis - will tx with azithro. Call if new sxs or not better in one week.    Allergic rhinitis - Continue nasal steroid spray for allergies.  Avoid oral antihistamines if too drying.    Colon cancers screen - will refer.   Declined flu vaccine.        I discussed the assessment and treatment plan with the patient. The patient was provided an opportunity to ask questions and all were answered. The patient agreed  with the plan and demonstrated an understanding of the instructions.   The patient was advised to call back or seek an in-person evaluation if the symptoms worsen or if the condition fails to improve as anticipated.   Beatrice Lecher, MD

## 2019-08-24 ENCOUNTER — Encounter: Payer: Self-pay | Admitting: Gastroenterology

## 2019-09-20 ENCOUNTER — Encounter: Payer: Self-pay | Admitting: Gastroenterology

## 2019-09-20 ENCOUNTER — Other Ambulatory Visit: Payer: Self-pay

## 2019-09-20 ENCOUNTER — Ambulatory Visit (AMBULATORY_SURGERY_CENTER): Payer: 59 | Admitting: *Deleted

## 2019-09-20 VITALS — Temp 96.9°F | Ht 69.0 in | Wt 183.4 lb

## 2019-09-20 DIAGNOSIS — Z1211 Encounter for screening for malignant neoplasm of colon: Secondary | ICD-10-CM

## 2019-09-20 DIAGNOSIS — Z1159 Encounter for screening for other viral diseases: Secondary | ICD-10-CM

## 2019-09-20 MED ORDER — SUPREP BOWEL PREP KIT 17.5-3.13-1.6 GM/177ML PO SOLN: 1.0000 | Freq: Once | ORAL | 0 refills | Status: AC

## 2019-09-20 NOTE — Progress Notes (Signed)
No egg or soy allergy known to patient  ssues with past sedation with any surgeries  or procedures of hard to wake post op =, no intubation problems  No diet pills per patient No home 02 use per patient  No blood thinners per patient  Pt denies issues with constipation  No A fib or A flutter  EMMI video sent to pt's e mail   Due to the COVID-19 pandemic we are asking patients to follow these guidelines. Please only bring one care partner. Please be aware that your care partner may wait in the car in the parking lot or if they feel like they will be too hot to wait in the car, they may wait in the lobby on the 4th floor. All care partners are required to wear a mask the entire time (we do not have any that we can provide them), they need to practice social distancing, and we will do a Covid check for all patient's and care partners when you arrive. Also we will check their temperature and your temperature. If the care partner waits in their car they need to stay in the parking lot the entire time and we will call them on their cell phone when the patient is ready for discharge so they can bring the car to the front of the building. Also all patient's will need to wear a mask into building.

## 2019-09-23 ENCOUNTER — Ambulatory Visit (INDEPENDENT_AMBULATORY_CARE_PROVIDER_SITE_OTHER): Payer: 59

## 2019-09-23 DIAGNOSIS — Z1159 Encounter for screening for other viral diseases: Secondary | ICD-10-CM

## 2019-09-23 LAB — SARS CORONAVIRUS 2 (TAT 6-24 HRS): SARS Coronavirus 2: NEGATIVE

## 2019-09-28 ENCOUNTER — Encounter: Payer: Self-pay | Admitting: Gastroenterology

## 2019-09-28 ENCOUNTER — Other Ambulatory Visit: Payer: Self-pay

## 2019-09-28 ENCOUNTER — Ambulatory Visit (AMBULATORY_SURGERY_CENTER): Payer: 59 | Admitting: Gastroenterology

## 2019-09-28 VITALS — BP 132/84 | HR 61 | Temp 98.1°F | Resp 14 | Ht 69.0 in | Wt 183.4 lb

## 2019-09-28 DIAGNOSIS — Z1211 Encounter for screening for malignant neoplasm of colon: Secondary | ICD-10-CM

## 2019-09-28 MED ORDER — SODIUM CHLORIDE 0.9 % IV SOLN: 500.0000 mL | Freq: Once | INTRAVENOUS | Status: DC

## 2019-09-28 NOTE — Progress Notes (Signed)
Pt's states no medical or surgical changes since previsit or office visit. 

## 2019-09-28 NOTE — Op Note (Signed)
[facility] Patient Name: Tyler Obrien Procedure Date: 09/28/2019 9:15 AM MRN: [id number] Endoscopist: Rachael Feeaniel P Shalva Rozycki , MD Age: 58 Referring MD:  Date of Birth: 11/07/1960 Gender: Male Account #: [id number] Procedure:                Colonoscopy Indications:              Screening for colorectal malignant neoplasm Medicines:                Monitored Anesthesia Care Procedure:                Pre-Anesthesia Assessment:                           - Prior to the procedure, a History and Physical                            was performed, and patient medications and                            allergies were reviewed. The patient's tolerance of                            previous anesthesia was also reviewed. The risks                            and benefits of the procedure and the sedation                            options and risks were discussed with the patient.                            All questions were answered, and informed consent                            was obtained. Prior Anticoagulants: The patient has                            taken no previous anticoagulant or antiplatelet                            agents. ASA Grade Assessment: II - A patient with                            mild systemic disease. After reviewing the risks                            and benefits, the patient was deemed in                            satisfactory condition to undergo the procedure.                           After obtaining informed consent, the colonoscope  was passed under direct vision. Throughout the                            procedure, the patient's blood pressure, pulse, and                            oxygen saturations were monitored continuously. The                            Colonoscope was introduced through the anus and                            advanced to the the cecum, identified by                            appendiceal orifice and  ileocecal valve. The                            colonoscopy was performed without difficulty. The                            patient tolerated the procedure well. The quality                            of the bowel preparation was good. The ileocecal                            valve, appendiceal orifice, and rectum were                            photographed. Scope In: 9:19:13 AM Scope Out: 9:29:03 AM Scope Withdrawal Time: 0 hours 8 minutes 15 seconds  Total Procedure Duration: 0 hours 9 minutes 50 seconds  Findings:                 Multiple small-mouthed diverticula were found in                            the left colon.                           The exam was otherwise without abnormality on                            direct and retroflexion views. Complications:            No immediate complications. Estimated blood loss:                            None. Estimated Blood Loss:     Estimated blood loss: none. Impression:               - Diverticulosis in the left colon.                           - The examination was otherwise normal on direct  and retroflexion views.                           - No specimens collected. Recommendation:           - Patient has a contact number available for                            emergencies. The signs and symptoms of potential                            delayed complications were discussed with the                            patient. Return to normal activities tomorrow.                            Written discharge instructions were provided to the                            patient.                           - Resume previous diet.                           - Continue present medications.                           - Repeat colonoscopy in 10 years for screening. Rachael Fee, MD 09/28/2019 9:32:24 AM This report has been signed electronically.

## 2019-09-28 NOTE — Progress Notes (Signed)
Temperature taken by L.C., VS taken by C.W. 

## 2019-09-28 NOTE — Patient Instructions (Signed)
Discharge instructions given. Handout on diverticulosis. Resume previous medications. YOU HAD AN ENDOSCOPIC PROCEDURE TODAY AT THE [facility]:   Refer to the procedure report that was given to you for any specific questions about what was found during the examination.  If the procedure report does not answer your questions, please call your gastroenterologist to clarify.  If you requested that your care partner not be given the details of your procedure findings, then the procedure report has been included in a sealed envelope for you to review at your convenience later.  YOU SHOULD EXPECT: Some feelings of bloating in the abdomen. Passage of more gas than usual.  Walking can help get rid of the air that was put into your GI tract during the procedure and reduce the bloating. If you had a lower endoscopy (such as a colonoscopy or flexible sigmoidoscopy) you may notice spotting of blood in your stool or on the toilet paper. If you underwent a bowel prep for your procedure, you may not have a normal bowel movement for a few days.  Please Note:  You might notice some irritation and congestion in your nose or some drainage.  This is from the oxygen used during your procedure.  There is no need for concern and it should clear up in a day or so.  SYMPTOMS TO REPORT IMMEDIATELY:   Following lower endoscopy (colonoscopy or flexible sigmoidoscopy):  Excessive amounts of blood in the stool  Significant tenderness or worsening of abdominal pains  Swelling of the abdomen that is new, acute  Fever of 100F or higher  For urgent or emergent issues, a gastroenterologist can be reached at any hour by calling [phone].   DIET:  We do recommend a small meal at first, but then you may proceed to your regular diet.  Drink plenty of fluids but you should avoid alcoholic beverages for 24 hours.  ACTIVITY:  You should plan to take it easy for the rest of today and you should NOT DRIVE or use  heavy machinery until tomorrow (because of the sedation medicines used during the test).    FOLLOW UP: Our staff will call the number listed on your records 48-72 hours following your procedure to check on you and address any questions or concerns that you may have regarding the information given to you following your procedure. If we do not reach you, we will leave a message.  We will attempt to reach you two times.  During this call, we will ask if you have developed any symptoms of COVID 19. If you develop any symptoms (ie: fever, flu-like symptoms, shortness of breath, cough etc.) before then, please call [phone].  If you test positive for Covid 19 in the 2 weeks post procedure, please call and report this information to us.    If any biopsies were taken you will be contacted by phone or by letter within the next 1-3 weeks.  Please call us at [phone] if you have not heard about the biopsies in 3 weeks.    SIGNATURES/CONFIDENTIALITY: You and/or your care partner have signed paperwork which will be entered into your electronic medical record.  These signatures attest to the fact that that the information above on your After Visit Summary has been reviewed and is understood.  Full responsibility of the confidentiality of this discharge information lies with you and/or your care-partner. 

## 2019-09-28 NOTE — Progress Notes (Signed)
To PACU, VSS. Report to [email or web address] 

## 2019-09-30 ENCOUNTER — Telehealth: Payer: Self-pay | Admitting: *Deleted

## 2019-09-30 ENCOUNTER — Telehealth: Payer: Self-pay

## 2019-09-30 NOTE — Telephone Encounter (Signed)
First attempt follow up call to pt, lm on vm 

## 2019-09-30 NOTE — Telephone Encounter (Signed)
  Follow up Call-  Call back number 09/28/2019  Post procedure Call Back phone  # [phone]  Permission to leave phone message Yes  Some recent data might be hidden     Patient questions:  Do you have a fever, pain , or abdominal swelling? No. Pain Score  0 *  Have you tolerated food without any problems? Yes.    Have you been able to return to your normal activities? Yes.    Do you have any questions about your discharge instructions: Diet   No. Medications  no Follow up visit  No.  Do you have questions or concerns about your Care? No.  Actions: * If pain score is 4 or above: No action needed, pain <4.   1. Have you developed a fever since your procedure? No   2.   Have you had an respiratory symptoms (SOB or cough) since your procedure? no  3.   Have you tested positive for COVID 19 since your procedure no  4.   Have you had any family members/close contacts diagnosed with the COVID 19 since your procedure?  no   If yes to any of these questions please route to Joylene John, RN and Alphonsa Gin, Therapist, sports.

## 2019-10-12 ENCOUNTER — Ambulatory Visit: Payer: 59 | Attending: Internal Medicine

## 2019-10-12 DIAGNOSIS — Z20822 Contact with and (suspected) exposure to covid-19: Secondary | ICD-10-CM

## 2019-10-14 ENCOUNTER — Encounter: Payer: Self-pay | Admitting: Osteopathic Medicine

## 2019-10-14 LAB — NOVEL CORONAVIRUS, NAA: SARS-CoV-2, NAA: NOT DETECTED

## 2019-10-18 ENCOUNTER — Telehealth (INDEPENDENT_AMBULATORY_CARE_PROVIDER_SITE_OTHER): Payer: 59 | Admitting: Nurse Practitioner

## 2019-10-18 ENCOUNTER — Encounter: Payer: Self-pay | Admitting: Nurse Practitioner

## 2019-10-18 DIAGNOSIS — J069 Acute upper respiratory infection, unspecified: Secondary | ICD-10-CM

## 2019-10-18 MED ORDER — PREDNISONE 20 MG PO TABS: 40.0000 mg | ORAL_TABLET | Freq: Every day | ORAL | 0 refills | Status: AC

## 2019-10-18 NOTE — Patient Instructions (Signed)
Continue using the albuterol inhaler and mucinex for symptoms. Drink plenty of fluids and keep the air humidified, if possible.   Take the prednisone each morning with breakfast. Take omeprazole daily, especially while taking prednisone.  If your symptoms are not improving at all or if you feel worse in 2-3 days, reach out to me on MyChart and we can discuss further options.   If you begin to feel very short of breath, experience dizziness, or a change in mental status, then seek emergency care.   Acute Bronchitis, Adult  Acute bronchitis is when air tubes in the lungs (bronchi) suddenly get swollen. The condition can make it hard for you to breathe. In adults, acute bronchitis usually goes away within 2 weeks. A cough caused by bronchitis may last up to 3 weeks. Smoking, allergies, and asthma can make the condition worse. What are the causes? This condition is caused by:  Cold and flu viruses. The most common cause of this condition is the virus that causes the common cold.  Bacteria.  Substances that irritate the lungs, including: ? Smoke from cigarettes and other types of tobacco. ? Dust and pollen. ? Fumes from chemicals, gases, or burned fuel. ? Other materials that pollute indoor or outdoor air.  Close contact with someone who has acute bronchitis. What increases the risk? The following factors may make you more likely to develop this condition:  A weak body's defense system. This is also called the immune system.  Any condition that affects your lungs and breathing, such as asthma. What are the signs or symptoms? Symptoms of this condition include:  A cough.  Coughing up clear, yellow, or green mucus.  Wheezing.  Chest congestion.  Shortness of breath.  A fever.  Body aches.  Chills.  A sore throat. How is this treated? Acute bronchitis may go away over time without treatment. Your doctor may recommend:  Drinking more fluids.  Taking a medicine for a fever  or cough.  Using a device that gets medicine into your lungs (inhaler).  Using a vaporizer or a humidifier. These are machines that add water or moisture in the air to help with coughing and poor breathing. Follow these instructions at home:  Activity  Get a lot of rest.  Avoid places where there are fumes from chemicals.  Return to your normal activities as told by your doctor. Ask your doctor what activities are safe for you. Lifestyle  Drink enough fluids to keep your pee (urine) pale yellow.  Do not drink alcohol.  Do not use any products that contain nicotine or tobacco, such as cigarettes, e-cigarettes, and chewing tobacco. If you need help quitting, ask your doctor. Be aware that: ? Your bronchitis will get worse if you smoke or breathe in other people's smoke (secondhand smoke). ? Your lungs will heal faster if you quit smoking. General instructions  Take over-the-counter and prescription medicines only as told by your doctor.  Use an inhaler, cool mist vaporizer, or humidifier as told by your doctor.  Rinse your mouth often with salt water. To make salt water, dissolve -1 tsp (3-6 g) of salt in 1 cup (237 mL) of warm water.  Keep all follow-up visits as told by your doctor. This is important. How is this prevented? To lower your risk of getting this condition again:  Wash your hands often with soap and water. If soap and water are not available, use hand sanitizer.  Avoid contact with people who have cold symptoms.  Try not to touch your mouth, nose, or eyes with your hands.  Make sure to get the flu shot every year. Contact a doctor if:  Your symptoms do not get better in 2 weeks.  You vomit more than once or twice.  You have symptoms of loss of fluid from your body (dehydration). These include: ? Dark urine. ? Dry skin or eyes. ? Increased thirst. ? Headaches. ? Confusion. ? Muscle cramps. Get help right away if:  You cough up blood.  You have  chest pain.  You have very bad shortness of breath.  You become dehydrated.  You faint or keep feeling like you are going to faint.  You keep vomiting.  You have a very bad headache.  Your fever or chills get worse. These symptoms may be an emergency. Do not wait to see if the symptoms will go away. Get medical help right away. Call your local emergency services (911 in the [place] Do not drive yourself to the hospital. Summary  Acute bronchitis is when air tubes in the lungs (bronchi) suddenly get swollen. In adults, acute bronchitis usually goes away within 2 weeks.  Take over-the-counter and prescription medicines only as told by your doctor.  Drink enough fluid to keep your pee (urine) pale yellow.  Contact a doctor if your symptoms do not improve after 2 weeks of treatment.  Get help right away if you cough up blood, faint, or have chest pain or shortness of breath. This information is not intended to replace advice given to you by your health care provider. Make sure you discuss any questions you have with your health care provider. Document Revised: 04/16/2019 Document Reviewed: 04/16/2019 Elsevier Patient Education  2020 [employer]  Upper Respiratory Infection, Adult An upper respiratory infection (URI) affects the nose, throat, and upper air passages. URIs are caused by germs (viruses). The most common type of URI is often called "the common cold." Medicines cannot cure URIs, but you can do things at home to relieve your symptoms. URIs usually get better within 7-10 days. Follow these instructions at home: Activity  Rest as needed.  If you have a fever, stay home from work or school until your fever is gone, or until your doctor says you may return to work or school. ? You should stay home until you cannot spread the infection anymore (you are not contagious). ? Your doctor may have you wear a face mask so you have less risk of spreading the infection. Relieving  symptoms  Gargle with a salt-water mixture 3-4 times a day or as needed. To make a salt-water mixture, completely dissolve -1 tsp of salt in 1 cup of warm water.  Use a cool-mist humidifier to add moisture to the air. This can help you breathe more easily. Eating and drinking   Drink enough fluid to keep your pee (urine) pale yellow.  Eat soups and other clear broths. General instructions   Take over-the-counter and prescription medicines only as told by your doctor. These include cold medicines, fever reducers, and cough suppressants.  Do not use any products that contain nicotine or tobacco. These include cigarettes and e-cigarettes. If you need help quitting, ask your doctor.  Avoid being where people are smoking (avoid secondhand smoke).  Make sure you get regular shots and get the flu shot every year.  Keep all follow-up visits as told by your doctor. This is important. How to avoid spreading infection to others   Wash your hands often with  soap and water. If you do not have soap and water, use hand sanitizer.  Avoid touching your mouth, face, eyes, or nose.  Cough or sneeze into a tissue or your sleeve or elbow. Do not cough or sneeze into your hand or into the air. Contact a doctor if:  You are getting worse, not better.  You have any of these: ? A fever. ? Chills. ? Brown or red mucus in your nose. ? Yellow or brown fluid (discharge)coming from your nose. ? Pain in your face, especially when you bend forward. ? Swollen neck glands. ? Pain with swallowing. ? White areas in the back of your throat. Get help right away if:  You have shortness of breath that gets worse.  You have very bad or constant: ? Headache. ? Ear pain. ? Pain in your forehead, behind your eyes, and over your cheekbones (sinus pain). ? Chest pain.  You have long-lasting (chronic) lung disease along with any of these: ? Wheezing. ? Long-lasting cough. ? Coughing up blood. ? A change  in your usual mucus.  You have a stiff neck.  You have changes in your: ? Vision. ? Hearing. ? Thinking. ? Mood. Summary  An upper respiratory infection (URI) is caused by a germ called a virus. The most common type of URI is often called "the common cold."  URIs usually get better within 7-10 days.  Take over-the-counter and prescription medicines only as told by your doctor. This information is not intended to replace advice given to you by your health care provider. Make sure you discuss any questions you have with your health care provider. Document Revised: 10/01/2018 Document Reviewed: 05/16/2017 Elsevier Patient Education  2020 Else[employer] Inc.  Gastroesophageal Reflux Disease, Adult Gastroesophageal reflux (GER) happens when acid from the stomach flows up into the tube that connects the mouth and the stomach (esophagus). Normally, food travels down the esophagus and stays in the stomach to be digested. With GER, food and stomach acid sometimes move back up into the esophagus. You may have a disease called gastroesophageal reflux disease (GERD) if the reflux:  Happens often.  Causes frequent or very bad symptoms.  Causes problems such as damage to the esophagus. When this happens, the esophagus becomes sore and swollen (inflamed). Over time, GERD can make small holes (ulcers) in the lining of the esophagus. What are the causes? This condition is caused by a problem with the muscle between the esophagus and the stomach. When this muscle is weak or not normal, it does not close properly to keep food and acid from coming back up from the stomach. The muscle can be weak because of:  Tobacco use.  Pregnancy.  Having a certain type of hernia (hiatal hernia).  Alcohol use.  Certain foods and drinks, such as coffee, chocolate, onions, and peppermint. What increases the risk? You are more likely to develop this condition if you:  Are overweight.  Have a disease that affects  your connective tissue.  Use NSAID medicines. What are the signs or symptoms? Symptoms of this condition include:  Heartburn.  Difficult or painful swallowing.  The feeling of having a lump in the throat.  A bitter taste in the mouth.  Bad breath.  Having a lot of saliva.  Having an upset or bloated stomach.  Belching.  Chest pain. Different conditions can cause chest pain. Make sure you see your doctor if you have chest pain.  Shortness of breath or noisy breathing (wheezing).  Ongoing (chronic)  cough or a cough at night.  Wearing away of the surface of teeth (tooth enamel).  Weight loss. How is this treated? Treatment will depend on how bad your symptoms are. Your doctor may suggest:  Changes to your diet.  Medicine.  Surgery. Follow these instructions at home: Eating and drinking   Follow a diet as told by your doctor. You may need to avoid foods and drinks such as: ? Coffee and tea (with or without caffeine). ? Drinks that contain alcohol. ? Energy drinks and sports drinks. ? Bubbly (carbonated) drinks or sodas. ? Chocolate and cocoa. ? Peppermint and mint flavorings. ? Garlic and onions. ? Horseradish. ? Spicy and acidic foods. These include peppers, chili powder, curry powder, vinegar, hot sauces, and BBQ sauce. ? Citrus fruit juices and citrus fruits, such as oranges, lemons, and limes. ? Tomato-based foods. These include red sauce, chili, salsa, and pizza with red sauce. ? Fried and fatty foods. These include donuts, french fries, potato chips, and high-fat dressings. ? High-fat meats. These include hot dogs, rib eye steak, sausage, ham, and bacon. ? High-fat dairy items, such as whole milk, butter, and cream cheese.  Eat small meals often. Avoid eating large meals.  Avoid drinking large amounts of liquid with your meals.  Avoid eating meals during the 2-3 hours before bedtime.  Avoid lying down right after you eat.  Do not exercise right  after you eat. Lifestyle   Do not use any products that contain nicotine or tobacco. These include cigarettes, e-cigarettes, and chewing tobacco. If you need help quitting, ask your doctor.  Try to lower your stress. If you need help doing this, ask your doctor.  If you are overweight, lose an amount of weight that is healthy for you. Ask your doctor about a safe weight loss goal. General instructions  Pay attention to any changes in your symptoms.  Take over-the-counter and prescription medicines only as told by your doctor. Do not take aspirin, ibuprofen, or other NSAIDs unless your doctor says it is okay.  Wear loose clothes. Do not wear anything tight around your waist.  Raise (elevate) the head of your bed about 6 inches (15 cm).  Avoid bending over if this makes your symptoms worse.  Keep all follow-up visits as told by your doctor. This is important. Contact a doctor if:  You have new symptoms.  You lose weight and you do not know why.  You have trouble swallowing or it hurts to swallow.  You have wheezing or a cough that keeps happening.  Your symptoms do not get better with treatment.  You have a hoarse voice. Get help right away if:  You have pain in your arms, neck, jaw, teeth, or back.  You feel sweaty, dizzy, or light-headed.  You have chest pain or shortness of breath.  You throw up (vomit) and your throw-up looks like blood or coffee grounds.  You pass out (faint).  Your poop (stool) is bloody or black.  You cannot swallow, drink, or eat. Summary  If a person has gastroesophageal reflux disease (GERD), food and stomach acid move back up into the esophagus and cause symptoms or problems such as damage to the esophagus.  Treatment will depend on how bad your symptoms are.  Follow a diet as told by your doctor.  Take all medicines only as told by your doctor. This information is not intended to replace advice given to you by your health care  provider. Make sure you discuss  any questions you have with your health care provider. Document Revised: 04/01/2018 Document Reviewed: 04/01/2018 Elsevier Patient Education  2020 [employer].

## 2019-10-18 NOTE — Progress Notes (Signed)
Virtual Visit via Video Note  I connected with [name] on 10/18/19 at 10:10 AM EST by the video enabled telemedicine application in [place], and verified that I am speaking with the correct person using two identifiers.   I discussed the limitations of evaluation and management by telemedicine and the availability of in person appointments. The patient expressed understanding and agreed to proceed.  Subjective:    CC: Shortness of breath and cough  HPI: Tyler Obrien is a 59 y.o. y/o male presenting via [facility] video today for shortness of breath and cough lasting 10 days. He had a COVID test last week, which was negative. He reports the issue feels like it is mostly in his upper chest in the esophagus, rather than the lungs. He becomes winded with activity. He states this feels different than his usual sinusitis. He has also had a "sour stomach", but no reflux symptoms that he has noticed.    He was on antibiotics in November for sinusitis, but his symptoms resolved completely from that.   UPPER RESPIRATORY TRACT INFECTION Worst symptom: Cough Fever: no Cough: yes Shortness of breath: yes Wheezing: no Chest pain: no Chest tightness: no Chest congestion: no Nasal congestion: no Runny nose: intermittent Post nasal drip: no Sneezing: no Sore throat: no Swollen glands: no Sinus pressure: yes Headache: yes Face pain: yes Toothache: no Ear pain: no  Ear pressure: no  Vomiting: no Rash: no Fatigue: yes Sick contacts: no Strep contacts: no  Context: stable Recurrent sinusitis: no Relief with OTC cold/cough medications: no  Treatments attempted: albuterol inhaler, mucinex, tessalon pearls.   Past medical history, Surgical history, Family history not pertinant except as noted below, Social history, Allergies, and medications have been entered into the medical record, reviewed, and corrections made.   Review of Systems:  General: No fevers, chills, fatigue, night  sweats, weight loss.   Neuro: No numbness, paresthesias, or change in mental status  HEENT: No ear pain/pressure, sore throat, difficulty swallowing, vision changes, rhinorrhea, epistaxis, loss of taste, loss of smell Pulmonary/CV: No chest pain, syncope GI: No abdominal pain, nausea, vomiting, diarrhea, changes in bowel habits, anorexia   Objective:    General: Speaking clearly in complete sentences without any shortness of breath.  Alert and oriented x3.  Normal judgment. No apparent acute distress. Occasional dry cough present.   Impression and Recommendations:    1. Upper respiratory infection, acute Appears to be viral etiology given symptoms. Patient declines chest x-ray at at this time. Will try prednisone burst to see if this helps with breathing. Continue supportive care of humidified air, mucinex, and albuterol as needed. Would like to hold off on an antibiotic at this time. Recommended the patient take daily omeprazole. Pt to reach out in 2-3 days if symptoms have not improved- will consider antibiotic at that time.   - predniSONE (DELTASONE) 20 MG tablet; Take 2 tablets (40 mg total) by mouth daily with breakfast for 5 days.  Dispense: 10 tablet; Refill: 0   No orders of the defined types were placed in this encounter.     I discussed the assessment and treatment plan with the patient. The patient was provided an opportunity to ask questions and all were answered. The patient agreed with the plan and demonstrated an understanding of the instructions.   The patient was advised to call back or seek an in-person evaluation if the symptoms worsen or if the condition fails to improve as anticipated.  25 non-face-to-face time was  provided during this encounter.   Tollie Eth, NP

## 2019-10-20 ENCOUNTER — Encounter: Payer: Self-pay | Admitting: Nurse Practitioner

## 2020-01-28 ENCOUNTER — Other Ambulatory Visit: Payer: Self-pay | Admitting: Physician Assistant

## 2020-01-28 NOTE — Telephone Encounter (Signed)
[facility] requesting med refill for azelastine eye drops. Rx written by historical provider.

## 2021-02-03 IMAGING — DX LEFT SHOULDER - 2+ VIEW
3 series · 3 of 3 positions shown · non-contrast
Comparison: Right shoulder radiograph

CLINICAL DATA: Right shoulder pain

EXAM:
LEFT SHOULDER - 2+ VIEW

[shoulder grashey]
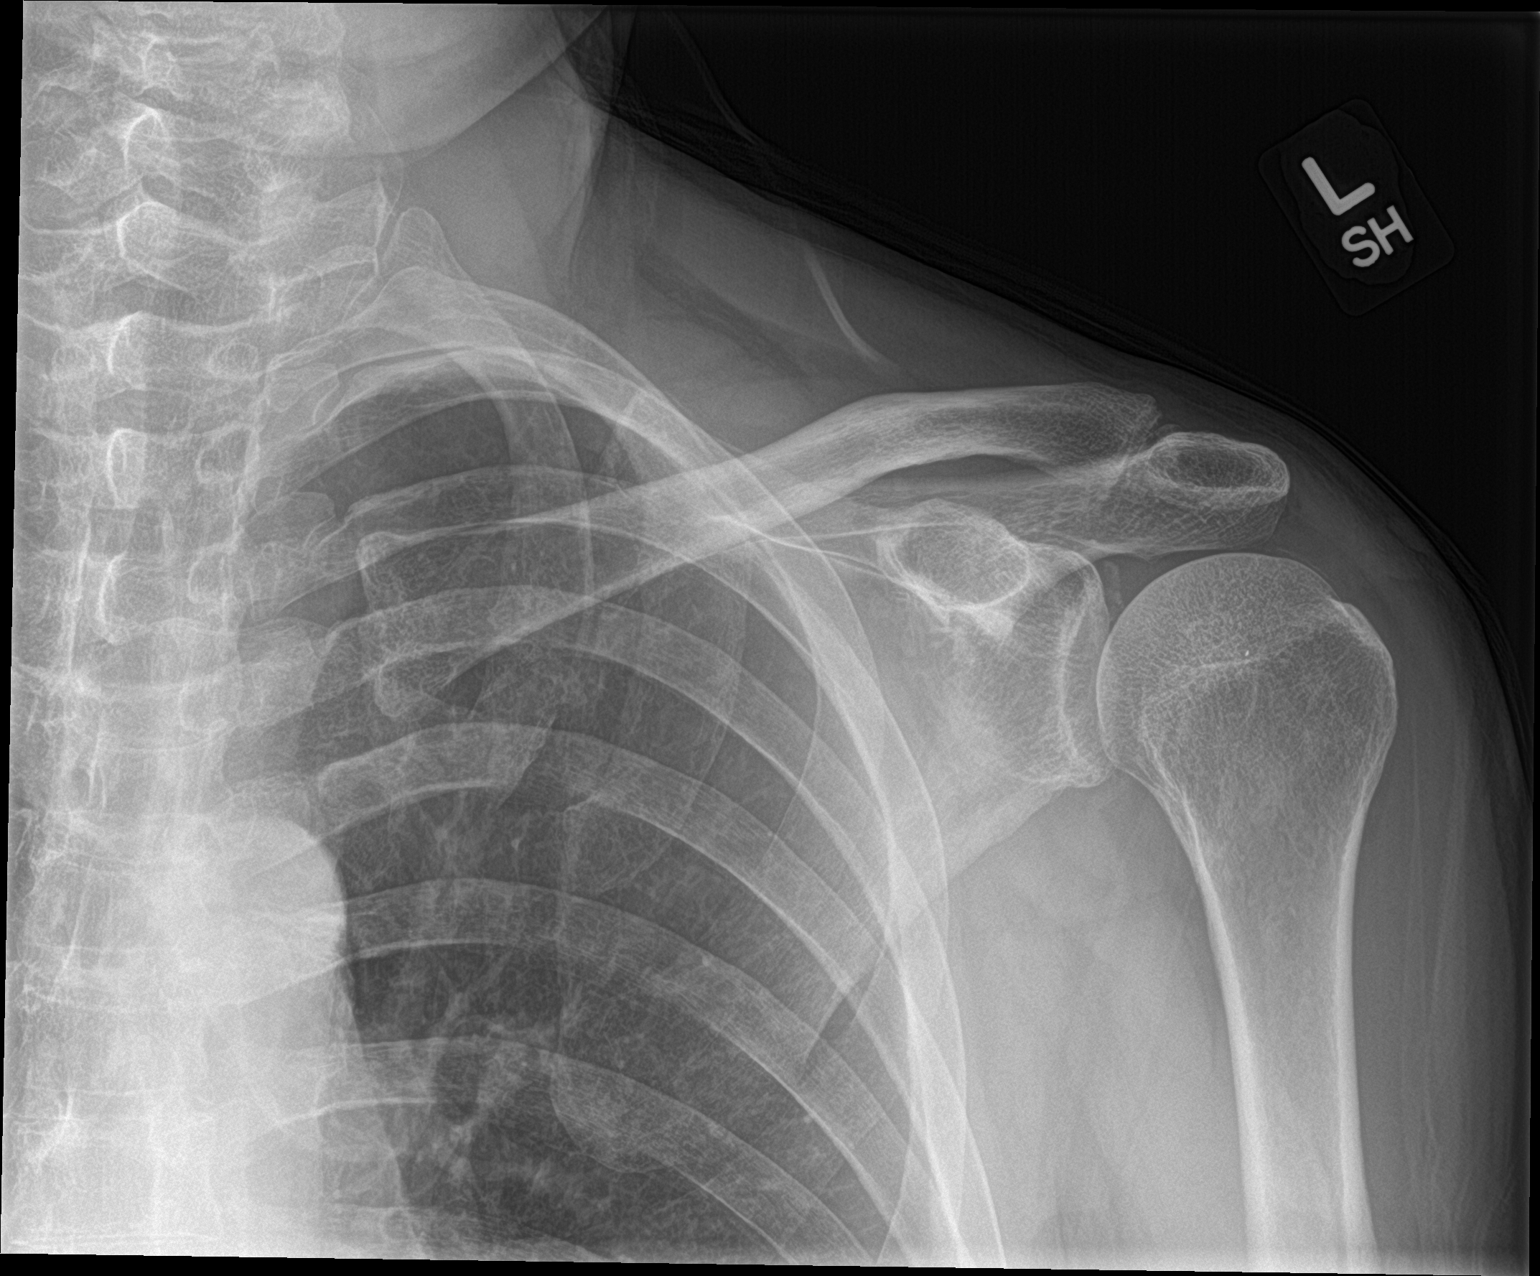

[shoulder y view]
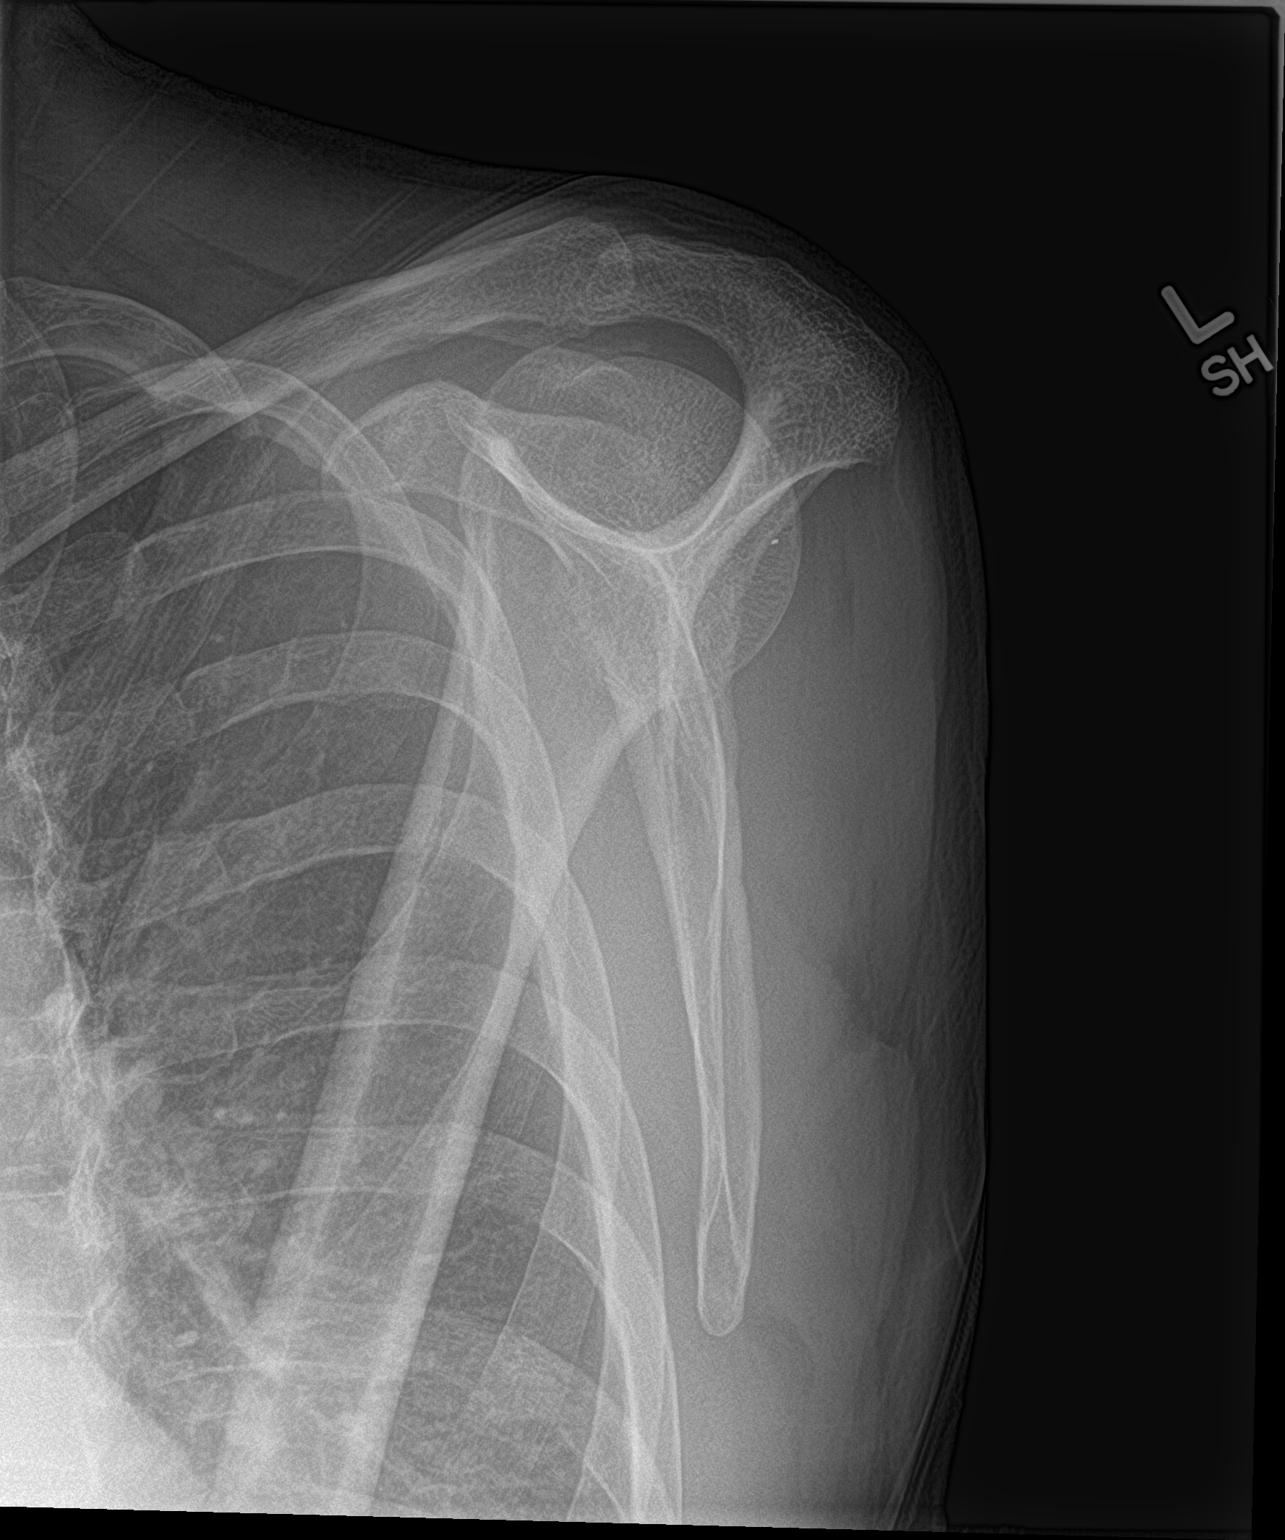

[shoulder axillary]
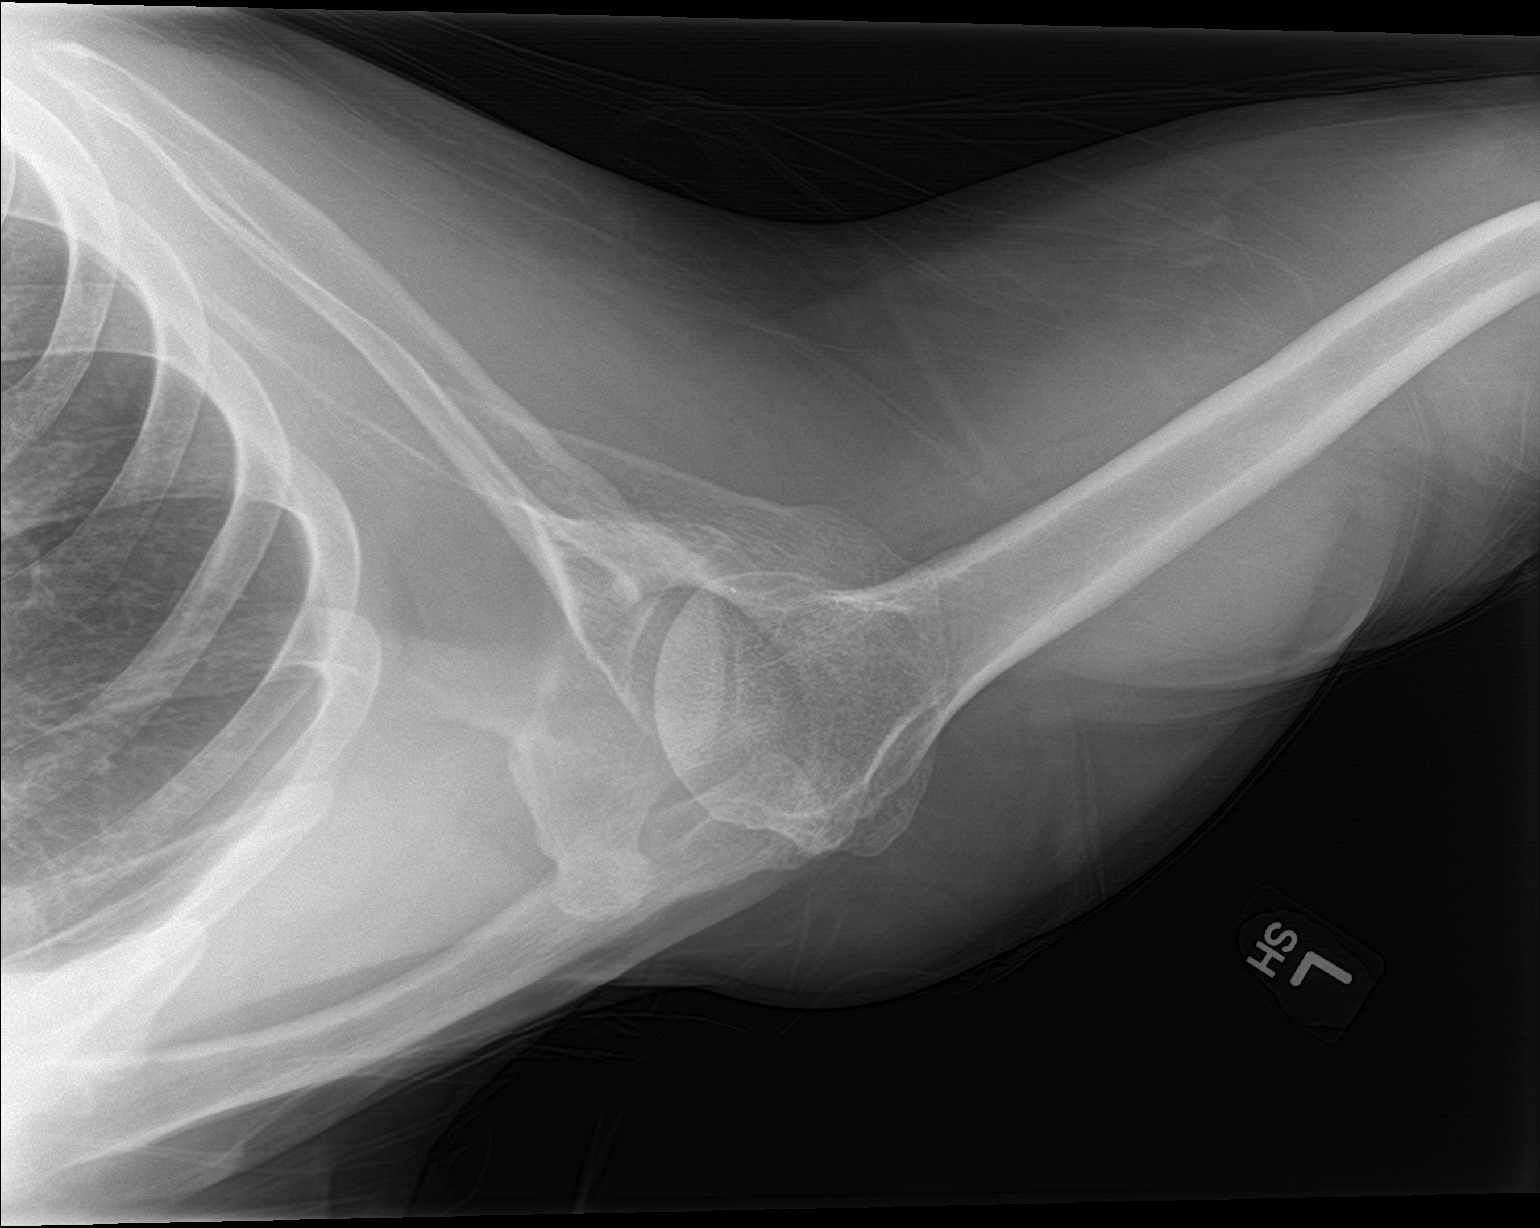

[3 of 3 positions shown; findings below may reference images not displayed]

FINDINGS: No fracture or malalignment. Mild AC joint degenerative change. Left
lung apex is clear
IMPRESSION: Mild AC joint degenerative change

## 2022-10-14 ENCOUNTER — Encounter (HOSPITAL_BASED_OUTPATIENT_CLINIC_OR_DEPARTMENT_OTHER): Payer: Self-pay

## 2022-10-14 ENCOUNTER — Emergency Department (HOSPITAL_BASED_OUTPATIENT_CLINIC_OR_DEPARTMENT_OTHER)
Admission: EM | Admit: 2022-10-14 | Discharge: 2022-10-14 | Disposition: A | Discharge status: Home / Self Care | Payer: 59 | Attending: Emergency Medicine | Admitting: Emergency Medicine

## 2022-10-14 ENCOUNTER — Other Ambulatory Visit: Payer: Self-pay

## 2022-10-14 ENCOUNTER — Emergency Department (HOSPITAL_BASED_OUTPATIENT_CLINIC_OR_DEPARTMENT_OTHER): Payer: 59

## 2022-10-14 DIAGNOSIS — N200 Calculus of kidney: Secondary | ICD-10-CM

## 2022-10-14 DIAGNOSIS — N132 Hydronephrosis with renal and ureteral calculous obstruction: Secondary | ICD-10-CM | POA: Diagnosis not present

## 2022-10-14 DIAGNOSIS — R9389 Abnormal findings on diagnostic imaging of other specified body structures: Secondary | ICD-10-CM | POA: Insufficient documentation

## 2022-10-14 DIAGNOSIS — Z7982 Long term (current) use of aspirin: Secondary | ICD-10-CM | POA: Diagnosis not present

## 2022-10-14 DIAGNOSIS — R109 Unspecified abdominal pain: Secondary | ICD-10-CM | POA: Diagnosis present

## 2022-10-14 DIAGNOSIS — R933 Abnormal findings on diagnostic imaging of other parts of digestive tract: Secondary | ICD-10-CM

## 2022-10-14 LAB — BASIC METABOLIC PANEL
Anion gap: 7 (ref 5–15)
BUN: 23 mg/dL (ref 8–23)
CO2: 24 mmol/L (ref 22–32)
Calcium: 9 mg/dL (ref 8.9–10.3)
Chloride: 106 mmol/L (ref 98–111)
Creatinine, Ser: 1.36 mg/dL — ABNORMAL HIGH (ref 0.61–1.24)
GFR, Estimated: 58 mL/min — ABNORMAL LOW (ref >60)
Glucose, Bld: 134 mg/dL — ABNORMAL HIGH (ref 70–99)
Potassium: 4 mmol/L (ref 3.5–5.1)
Sodium: 137 mmol/L (ref 135–145)

## 2022-10-14 LAB — CBC WITH DIFFERENTIAL/PLATELET
Abs Immature Granulocytes: 0.01 10*3/uL (ref 0.00–0.07)
Basophils Absolute: 0 10*3/uL (ref 0.0–0.1)
Basophils Relative: 0 %
Eosinophils Absolute: 0.2 10*3/uL (ref 0.0–0.5)
Eosinophils Relative: 3 %
HCT: 41.2 % (ref 39.0–52.0)
Hemoglobin: 13.1 g/dL (ref 13.0–17.0)
Immature Granulocytes: 0 %
Lymphocytes Relative: 39 %
Lymphs Abs: 2.6 10*3/uL (ref 0.7–4.0)
MCH: 25.9 pg — ABNORMAL LOW (ref 26.0–34.0)
MCHC: 31.8 g/dL (ref 30.0–36.0)
MCV: 81.4 fL (ref 80.0–100.0)
Monocytes Absolute: 0.6 10*3/uL (ref 0.1–1.0)
Monocytes Relative: 9 %
Neutro Abs: 3.3 10*3/uL (ref 1.7–7.7)
Neutrophils Relative %: 49 %
Platelets: 250 10*3/uL (ref 150–400)
RBC: 5.06 MIL/uL (ref 4.22–5.81)
RDW: 14.2 % (ref 11.5–15.5)
WBC: 6.8 10*3/uL (ref 4.0–10.5)
nRBC: 0 % (ref 0.0–0.2)

## 2022-10-14 MED ORDER — MORPHINE SULFATE (PF) 4 MG/ML IV SOLN
4.0000 mg | Freq: Once | INTRAVENOUS | Status: AC
  Administered 2022-10-14: 4 mg via INTRAVENOUS
  Filled 2022-10-14: qty 1

## 2022-10-14 MED ORDER — KETOROLAC TROMETHAMINE 30 MG/ML IJ SOLN
30.0000 mg | Freq: Once | INTRAMUSCULAR | Status: AC
  Administered 2022-10-14: 30 mg via INTRAVENOUS
  Filled 2022-10-14: qty 1

## 2022-10-14 MED ORDER — ONDANSETRON HCL 4 MG/2ML IJ SOLN
4.0000 mg | Freq: Once | INTRAMUSCULAR | Status: AC
  Administered 2022-10-14: 4 mg via INTRAVENOUS
  Filled 2022-10-14: qty 2

## 2022-10-14 MED ORDER — OXYCODONE-ACETAMINOPHEN 5-325 MG PO TABS: 2.0000 | ORAL_TABLET | ORAL | 0 refills | Status: DC | PRN

## 2022-10-14 NOTE — Discharge Instructions (Signed)
Begin taking Percocet as prescribed as needed for pain.  Follow-up with urology if the stone has not passed in the next few days.  The contact information for [facility] has been provided in this discharge summary for you to call and make these arrangements.  There was also an abnormal finding within your stomach for which radiology has recommended GI follow-up for possible endoscopy.  A referral has been placed to [facility] for these arrangements to be made.  Please call the office in the next 1 to 2 days if you have not heard from them.  Return to the emergency department in the meantime if symptoms significantly worsen or change.

## 2022-10-14 NOTE — ED Notes (Signed)
Discharge instructions reviewed with patient. Patient verbalizes understanding, no further questions at this time. Medications/prescriptions and follow up information provided. No acute distress noted at time of departure.  

## 2022-10-14 NOTE — ED Provider Notes (Signed)
[place] EMERGENCY DEPARTMENT Provider Note   CSN: [id number] Arrival date & time: 10/14/22  0500     History  Chief Complaint  Patient presents with   Flank Pain    Tyler Obrien is a 62 y.o. male.  Patient is a 62 year old male with past medical history of kidney stones, degenerative disc disease, seasonal allergies.  Patient presenting today with complaints of left flank pain.  This started acutely in the night and woke him from sleep.  He describes pain radiating from the left flank into the left groin.  He denies any bowel or bladder complaints.  He denies any fevers or chills.  He denies any aggravating or alleviating factors.  The history is provided by the patient.       Home Medications Prior to Admission medications   Medication Sig Start Date End Date Taking? Authorizing Provider  albuterol (VENTOLIN HFA) 108 (90 Base) MCG/ACT inhaler Inhale 1 puff into the lungs every 4 (four) hours as needed. 10/11/19   [provider]  Ascorbic Acid (VITAMIN C) 100 MG tablet Take 100 mg by mouth.    [provider]  aspirin EC 81 MG tablet Take 81 mg by mouth daily.    [provider]  azelastine (OPTIVAR) 0.05 % ophthalmic solution INSTILL 1 DROP INTO BOTH EYES TWICE A DAY 01/31/20   Emeterio Reeve, DO  b complex vitamins tablet Take by mouth. b 12 and folate combo    [provider]  benzonatate (TESSALON) 200 MG capsule Take 1 capsule by mouth daily as needed. 10/11/19   [provider]  Cholecalciferol (VITAMIN D3) 75 MCG (3000 UT) TABS Take 1 tablet by mouth daily.    [provider]  Coenzyme Q10 (COQ-10 PO) Take by mouth.    [provider]  cyclobenzaprine (FLEXERIL) 10 MG tablet One half tab PO qHS, then increase gradually to one tab TID. Patient not taking: Reported on 10/18/2019 03/24/19   Silverio Decamp, MD  fexofenadine (ALLEGRA) 60 MG tablet Take 60 mg by mouth 2 (two) times daily.     [provider]  fluticasone (FLONASE) 50 MCG/ACT nasal spray Place 1 spray into both nostrils daily.    [provider]  guaiFENesin (MUCINEX PO) Take by mouth. 800 mg bid    [provider]  ipratropium (ATROVENT) 0.03 % nasal spray Place 2 sprays into both nostrils every 12 (twelve) hours. Patient not taking: Reported on 10/18/2019 09/07/18   Jacqulyn Cane, MD  Multiple Vitamin (MULTIVITAMIN) tablet Take 1 tablet by mouth daily.    [provider]  olopatadine (PATANOL) 0.1 % ophthalmic solution Place 1 drop into both eyes 2 (two) times daily. As needed for eye allergies Patient not taking: Reported on 10/18/2019 09/21/18   Emeterio Reeve, DO  Omega-3 Fatty Acids (OMEGA 3 PO) Take by mouth. 1336 mg daily    [provider]  omeprazole (PRILOSEC) 20 MG capsule Take 20 mg by mouth.    [provider]      Allergies    Nsaids    Review of Systems   Review of Systems  All other systems reviewed and are negative.   Physical Exam Updated Vital Signs BP (!) 190/104 (BP Location: Right Arm)   Pulse 62   Temp 97.6 F (36.4 C) (Oral)   Resp 18   Ht 5\' 9"  (1.753 m)   Wt 86.2 kg   SpO2 97%   BMI 28.06 kg/m  Physical Exam  Vitals and nursing note reviewed.  Constitutional:      General: He is not in acute distress.    Appearance: He is well-developed. He is not diaphoretic.  HENT:     Head: Normocephalic and atraumatic.  Cardiovascular:     Rate and Rhythm: Normal rate and regular rhythm.     Heart sounds: No murmur heard.    No friction rub.  Pulmonary:     Effort: Pulmonary effort is normal. No respiratory distress.     Breath sounds: Normal breath sounds. No wheezing or rales.  Abdominal:     General: Bowel sounds are normal. There is no distension.     Palpations: Abdomen is soft.     Tenderness: There is abdominal tenderness. There is left CVA tenderness. There is no right CVA tenderness, guarding or rebound.      Comments: There is tenderness to palpation in the left lower quadrant.  Musculoskeletal:        General: Normal range of motion.     Cervical back: Normal range of motion and neck supple.  Skin:    General: Skin is warm and dry.  Neurological:     Mental Status: He is alert and oriented to person, place, and time.     Coordination: Coordination normal.     ED Results / Procedures / Treatments   Labs (all labs ordered are listed, but only abnormal results are displayed) Labs Reviewed  BASIC METABOLIC PANEL  CBC WITH DIFFERENTIAL/PLATELET  URINALYSIS, ROUTINE W REFLEX MICROSCOPIC    EKG None  Radiology No results found.  Procedures Procedures    Medications Ordered in ED Medications  ondansetron (ZOFRAN) injection 4 mg (has no administration in time range)  ketorolac (TORADOL) 30 MG/ML injection 30 mg (has no administration in time range)  morphine (PF) 4 MG/ML injection 4 mg (has no administration in time range)    ED Course/ Medical Decision Making/ A&P  Patient presenting here with complaints of left flank pain as described in the HPI.  He has history of kidney stones and this feels similar.  He arrives here with stable vital signs and is afebrile.  There is tenderness to the left lower quadrant, but exam otherwise unremarkable.  Workup initiated including CBC and basic metabolic panel.  These studies were essentially unremarkable.  CT scan with with renal protocol obtained showing 2 stones in the distal third of the left ureter with moderate left hydroureteronephrosis.  Patient treated with Toradol, Zofran, and morphine and symptoms have significantly improved.  At this point, patient is afebrile with no white count and is nontoxic in appearance.  I feel as though he can safely be discharged with pain medication and follow-up with urology if he has not passed the stone.  CT scan also showing a subtle exophytic lesion associated with the lesser curvature of the  stomach concerning for possible neoplasm.  Radiology has recommended endoscopy.  Patient informed of the CT scan findings and will be referred to [facility] as he has seen them in the past.  Final Clinical Impression(s) / ED Diagnoses Final diagnoses:  None    Rx / DC Orders ED Discharge Orders     None         Geoffery Lyons, MD 10/14/22 [phone]

## 2022-10-14 NOTE — ED Triage Notes (Signed)
Pt reports that he began to have acute pain last evening in the LT flank. Hx of kidney stones; usually has blood in urine, but has not noticed hematuria this time. Pt endorses increased urgency. No fevers.

## 2022-10-18 ENCOUNTER — Ambulatory Visit: Payer: 59 | Admitting: Nurse Practitioner

## 2022-10-18 ENCOUNTER — Other Ambulatory Visit (INDEPENDENT_AMBULATORY_CARE_PROVIDER_SITE_OTHER): Payer: 59

## 2022-10-18 ENCOUNTER — Encounter: Payer: Self-pay | Admitting: Nurse Practitioner

## 2022-10-18 VITALS — BP 122/90 | HR 79 | Ht 69.0 in | Wt 195.4 lb

## 2022-10-18 DIAGNOSIS — N200 Calculus of kidney: Secondary | ICD-10-CM

## 2022-10-18 DIAGNOSIS — K319 Disease of stomach and duodenum, unspecified: Secondary | ICD-10-CM | POA: Diagnosis not present

## 2022-10-18 LAB — BASIC METABOLIC PANEL
BUN: 16 mg/dL (ref 6–23)
CO2: 28 mEq/L (ref 19–32)
Calcium: 9.2 mg/dL (ref 8.4–10.5)
Chloride: 104 mEq/L (ref 96–112)
Creatinine, Ser: 1.29 mg/dL (ref 0.40–1.50)
GFR: 58.97 mL/min — ABNORMAL LOW (ref >60.00)
Glucose, Bld: 98 mg/dL (ref 70–99)
Potassium: 4.6 mEq/L (ref 3.5–5.1)
Sodium: 138 mEq/L (ref 135–145)

## 2022-10-18 NOTE — Patient Instructions (Addendum)
   If you are age 64 or older, your body mass index should be between 23-30. Your Body mass index is 28.85 kg/m. If this is out of the aforementioned range listed, please consider follow up with your Primary Care Provider.  If you are age 71 or younger, your body mass index should be between 19-25. Your Body mass index is 28.85 kg/m. If this is out of the aformentioned range listed, please consider follow up with your Primary Care Provider.   Your provider has requested that you go to the basement level for lab work before leaving today. Press "B" on the elevator. The lab is located at the first door on the left as you exit the elevator.  You have been scheduled for an endoscopy. Please follow written instructions given to you at your visit today. If you use inhalers (even only as needed), please bring them with you on the day of your procedure.  Follow up with your urologist.  Start Miralax daily at night time as need for Constipation.   The [place] [facility] providers would like to encourage you to use [facility] to communicate with providers for non-urgent requests or questions.  Due to long hold times on the telephone, sending your provider a message by [facility] may be a faster and more efficient way to get a response.  Please allow 48 business hours for a response.  Please remember that this is for non-urgent requests.   It was a pleasure to see you today!  Thank you for trusting me with your gastrointestinal care!

## 2022-10-18 NOTE — Progress Notes (Signed)
10/18/2022 ADAIR LAUDERBACK [id number] 20-Sep-1961   CHIEF COMPLAINT: Abnormal CT scan  HISTORY OF PRESENT ILLNESS: Avedis Bevis is a 62 year old male with a past medical history of recurrent kidney stones, CVA 2011 (etiology unclear), GERD, UGI bleed secondary to a gastric ulcer with associated NSAID use 1992.   He presents to our office to day as referred by Dr. Veryl Speak for further evaluation regarding a gastric lesions seen per renal CT scan. He presented to the ED 10/14/2022 with left flank pain with concerns of having recurrent kidney stones.  Labs in the ED showed a BUN level of 23.  Creatinine 1.36.  WBC count 6.8.  Hemoglobin 13.1.  Hematocrit 41.2.  Platelet 250.  CTAP without contrast showed 2 distal renal stones in the left ureter with  hydroureteronephrosis indicating obstruction and an exophytic soft tissue attenuation lesion to the antral pre-pyloric region concerning for a neoplasm, possible GIST. An EGD as an outpatient was recommended.  He has not scheduled a follow-up appointment with his urologist at this point.  He denies having any nausea or vomiting.  No heartburn.  He has infrequent sporadic dysphagia with solid foods for the past 20 years.  He describes food which briefly gets stuck to the upper esophagus then passes after he drinks a few sips of water.  No specific food triggers.  He has a history of an upper GI bleed secondary to gastric ulcer with associated NSAID use in 1992.  He reported undergoing an EGD while living in [place] in the early 2000's which was negative.  He takes Omeprazole 20 mg p.o. daily.  He takes ASA 81 mg daily.  He rarely takes Advil as needed for generalized aches and pains.  No lower abdominal pain.  He typically passes a normal formed brown bowel movement daily, however, he is having some constipation issues after taking Oxycodone on for kidney stone related pain.  No rectal bleeding or black stools.  Maternal grandfather with history of  stomach cancer.  He underwent a screening colonoscopy by Dr. Ardis Hughs 09/28/2019 which showed diverticulosis to the left colon otherwise was normal.  A repeat colonoscopy in 10 years was recommended.  No history of diverticulitis.  Reported having low respirations with anesthesia and sedation used in the past, however, no problems with MAC utilized during his 09/2019 colonoscopy.  Fever, sweats or chills.  No weight loss.  He continues to have left flank pain. No fevers.      Latest Ref Rng & Units 10/14/2022    5:34 AM 09/10/2018    8:44 AM 09/06/2016    8:26 AM  CBC  WBC 4.0 - 10.5 K/uL 6.8  12.5  5.5   Hemoglobin 13.0 - 17.0 g/dL 13.1  15.2  14.7   Hematocrit 39.0 - 52.0 % 41.2  45.5  43.8   Platelets 150 - 400 K/uL 250  217  176        Latest Ref Rng & Units 10/14/2022    5:34 AM 09/10/2018    8:44 AM 09/06/2016    8:26 AM  CMP  Glucose 70 - 99 mg/dL 134  93  92   BUN 8 - 23 mg/dL _0 Creatinine 0.61 - 1.24 mg/dL 1.36  1.37  1.09   Sodium 135 - 145 mmol/L 137  137  138   Potassium 3.5 - 5.1 mmol/L 4.0  5.1  4.5   Chloride 98 - 111 mmol/L 106  102  104   CO2 22 - 32 mmol/L _0 Calcium 8.9 - 10.3 mg/dL 9.0  9.6  9.3   Total Protein 6.1 - 8.1 g/dL  7.1  6.4   Total Bilirubin 0.2 - 1.2 mg/dL  0.4  0.4   Alkaline Phos 40 - 115 U/L   59   AST 10 - 35 U/L  20  23   ALT 9 - 46 U/L  20  15      Renal CT 10/14/2022: IMPRESSION: 1. Two stones in the distal third of the left ureter with moderate proximal left hydroureteronephrosis indicating obstruction, as above. 2. Multiple additional nonobstructive calculi in the collecting systems of both kidneys. 3. Subtle exophytic lesion associated with the lesser curvature in the antral pre-pyloric region of the stomach concerning for neoplasm such as GI stromal tumor (GIST). Outpatient referral to GI for further evaluation and probable endoscopy and tissue sampling is strongly recommended in the near future to exclude  underlying malignancy. 4. Colonic diverticulosis without evidence of acute diverticulitis at this time. 5. Aortic atherosclerosis.  GI PROCEDURES:   Colonoscopy by Dr. Ardis Hughs 09/28/2019: Diverticulosis in the left colon otherwise normal colonoscopy  10 year recall colonoscopy   Past Medical History:  Diagnosis Date   Allergy    Bleeding gastric ulcer    Blood transfusion without reported diagnosis    CVA (cerebral infarction) 2011   GERD (gastroesophageal reflux disease)    prilosec as needed- upset stomach mostly - started after gastric bleeding ulcer   Kidney stone    Stroke [facility]) 2011   Past Surgical History:  Procedure Laterality Date   APPENDECTOMY     COLONOSCOPY     10 yrs ago    cyst removal from scrotum     cyst removed from elbow     [place]  08/2009   OPEN ANTERIOR SHOULDER RECONSTRUCTION     UPPER GASTROINTESTINAL ENDOSCOPY     10 yrs ago    WISDOM TOOTH EXTRACTION     Social History:  He is married. He has one daughter. He works in [employer]. He reports that he has never smoked. He has quit using smokeless tobacco. He reports current alcohol use of about 4.0 standard drinks of alcohol per week. He reports that he does not use drugs.  Family History: family history includes Cancer in his father and mother; Endometrial cancer in his mother; Hyperlipidemia in his father; Hypertension in his father; Lung cancer in his father; Stomach cancer in his maternal grandfather.  Allergies  Allergen Reactions   Nsaids Other (See Comments)    ULCER      Outpatient Encounter Medications as of 10/18/2022  Medication Sig   albuterol (VENTOLIN HFA) 108 (90 Base) MCG/ACT inhaler Inhale 1 puff into the lungs every 4 (four) hours as needed.   Ascorbic Acid (VITAMIN C) 100 MG tablet Take 100 mg by mouth.   aspirin EC 81 MG tablet Take 81 mg by mouth daily.   azelastine (OPTIVAR) 0.05 % ophthalmic solution INSTILL 1 DROP INTO BOTH EYES TWICE A  DAY   b complex vitamins tablet Take by mouth. b 12 and folate combo   benzonatate (TESSALON) 200 MG capsule Take 1 capsule by mouth daily as needed.   Cholecalciferol (VITAMIN D3) 75 MCG (3000 UT) TABS Take 1 tablet by mouth daily.   Coenzyme Q10 (COQ-10 PO) Take by mouth.   cyclobenzaprine (FLEXERIL)  10 MG tablet One half tab PO qHS, then increase gradually to one tab TID. (Patient not taking: Reported on 10/18/2019)   fexofenadine (ALLEGRA) 60 MG tablet Take 60 mg by mouth 2 (two) times daily.   fluticasone (FLONASE) 50 MCG/ACT nasal spray Place 1 spray into both nostrils daily.   guaiFENesin (MUCINEX PO) Take by mouth. 800 mg bid   ipratropium (ATROVENT) 0.03 % nasal spray Place 2 sprays into both nostrils every 12 (twelve) hours. (Patient not taking: Reported on 10/18/2019)   Multiple Vitamin (MULTIVITAMIN) tablet Take 1 tablet by mouth daily.   olopatadine (PATANOL) 0.1 % ophthalmic solution Place 1 drop into both eyes 2 (two) times daily. As needed for eye allergies (Patient not taking: Reported on 10/18/2019)   Omega-3 Fatty Acids (OMEGA 3 PO) Take by mouth. 1336 mg daily   omeprazole (PRILOSEC) 20 MG capsule Take 20 mg by mouth.   oxyCODONE-acetaminophen (PERCOCET) 5-325 MG tablet Take 2 tablets by mouth every 4 (four) hours as needed.   No facility-administered encounter medications on file as of 10/18/2022.    REVIEW OF SYSTEMS:  Gen: + Fatigue. Denies fever, sweats or chills. No weight loss.  CV: Denies chest pain, palpitations or edema. Resp: Denies cough, shortness of breath of hemoptysis.  GI: Denies heartburn, dysphagia, stomach or lower abdominal pain. No diarrhea or constipation.  GU : See HPI. + Blood in urine.  MS: + Arthritis and back pain.  Derm: Denies rash, itchiness, skin lesions or unhealing ulcers. Psych: Denies depression, anxiety, memory loss or confusion. Heme: Denies bruising, easy bleeding. Neuro:  Denies headaches, dizziness or paresthesias. Endo:  Denies  any problems with DM, thyroid or adrenal function.  PHYSICAL EXAM: BP (!) 122/90   Pulse 79   Ht _0  (1.753 m)   Wt 195 lb 6 oz (88.6 kg)   BMI 28.85 kg/m    General: 62 year old male in no acute distress. Head: Normocephalic and atraumatic. Eyes:  Sclerae non-icteric, conjunctive pink. Ears: Normal auditory acuity. Mouth: Dentition intact. No ulcers or lesions.  Neck: Supple, no lymphadenopathy or thyromegaly.  Lungs: Clear bilaterally to auscultation without wheezes, crackles or rhonchi. Heart: Regular rate and rhythm. No murmur, rub or gallop appreciated.  Abdomen: Soft, nontender, nondistended. No masses. No hepatosplenomegaly. Normoactive bowel sounds x 4 quadrants. Diastasis recti present.  Rectal: Deferred.  Musculoskeletal: Symmetrical with no gross deformities. Skin: Warm and dry. No rash or lesions on visible extremities. Extremities: No edema. Neurological: Alert oriented x 4, no focal deficits.  Psychological:  Alert and cooperative. Normal mood and affect.  ASSESSMENT AND PLAN:  35) 62 year old male with a history of kidney stones with left flank pain who presented to the ED 1/8/204. CTAP without contrast showed 2 distal renal stones in the left ureter with hydroureteronephrosis indicating obstruction.  Creatinine 1.36.  Urology follow up not yet scheduled. -Recommend urology follow-up ASAP.  The patient stated [facility] to schedule an urgent follow-up appointment. -He will contact our office if he develops worsening urinary symptoms or if he requires any surgical intervention -BMP  2) CTAP without contrast 10/14/2022 identified an antral pre-pyloric region lesion concerning for a neoplasm, possibe GIST.  Maternal grandfather with history of stomach cancer. -EGD benefits and risks discussed including risk with sedation, risk of bleeding, perforation and infection   3) Intermittent dysphagia with solid foods x 20 years   -EGD as ordered above  4) History of upper GI  bleed secondary to NSAID induced gastric ulcers in 1992. On ASA 10m QD. Rare Advil use.  -Continue Omeprazole 20 mg p.o. daily  5) Colon cancer screening. Normal colonoscopy 09/2019. -Next colonoscopy due 09/2029  6) Constipation secondary to recent opioid use -MiraLAX nightly as needed  7) Colon cancer screening.  Colonoscopy 09/2019 showed diverticulosis in left colon, no polyps. -Next screening colonoscopy due 09/2029    CC:  AEmeterio Reeve DO

## 2022-10-18 NOTE — Progress Notes (Signed)
Addendum: Reviewed and agree with assessment and management plan. Manveer Gomes M, MD  

## 2022-10-20 ENCOUNTER — Encounter: Payer: Self-pay | Admitting: Certified Registered Nurse Anesthetist

## 2022-10-21 ENCOUNTER — Telehealth: Payer: Self-pay

## 2022-10-21 NOTE — Telephone Encounter (Signed)
Called patient to offer an earlier appointment time.  He will arrive at 1:00 pm for a 2:00 pm procedure.

## 2022-10-22 ENCOUNTER — Ambulatory Visit (AMBULATORY_SURGERY_CENTER): Payer: 59 | Admitting: Internal Medicine

## 2022-10-22 ENCOUNTER — Encounter: Payer: Self-pay | Admitting: Internal Medicine

## 2022-10-22 VITALS — BP 115/77 | HR 72 | Temp 97.8°F | Resp 14 | Ht 69.0 in | Wt 195.0 lb

## 2022-10-22 DIAGNOSIS — R933 Abnormal findings on diagnostic imaging of other parts of digestive tract: Secondary | ICD-10-CM | POA: Diagnosis not present

## 2022-10-22 MED ORDER — SODIUM CHLORIDE 0.9 % IV SOLN: 500.0000 mL | Freq: Once | INTRAVENOUS | Status: DC

## 2022-10-22 NOTE — Op Note (Signed)
[place] Patient Name: Tyler Obrien Procedure Date: 10/22/2022 1:24 PM MRN: [id number] Endoscopist: Jerene Bears , MD, [id number] Age: 62 Referring MD:  Date of Birth: [birth year] Gender: Male Account #: [id number] Procedure:                Upper GI endoscopy Indications:              Abnormal CT of the GI tract (lesser curve gastric                            lesion) Medicines:                Monitored Anesthesia Care Procedure:                Pre-Anesthesia Assessment:                           - Prior to the procedure, a History and Physical                            was performed, and patient medications and                            allergies were reviewed. The patient's tolerance of                            previous anesthesia was also reviewed. The risks                            and benefits of the procedure and the sedation                            options and risks were discussed with the patient.                            All questions were answered, and informed consent                            was obtained. Prior Anticoagulants: The patient has                            taken no anticoagulant or antiplatelet agents. ASA                            Grade Assessment: III - A patient with severe                            systemic disease. After reviewing the risks and                            benefits, the patient was deemed in satisfactory                            condition to undergo the procedure.  After obtaining informed consent, the endoscope was                            passed under direct vision. Throughout the                            procedure, the patient's blood pressure, pulse, and                            oxygen saturations were monitored continuously. The                            Endoscope was introduced through the mouth, and                            advanced to the second part of duodenum. The  upper                            GI endoscopy was accomplished without difficulty.                            The patient tolerated the procedure well. Scope In: Scope Out: Findings:                 The examined esophagus was normal.                           The cardia and gastric fundus were normal on                            retroflexion.                           The entire examined stomach was normal.                           The examined duodenum was normal. Complications:            No immediate complications. Estimated Blood Loss:     Estimated blood loss: none. Impression:               - Normal esophagus.                           - Normal stomach.                           - Normal examined duodenum.                           - No specimens collected. Recommendation:           - Patient has a contact number available for                            emergencies. The signs and symptoms of potential  delayed complications were discussed with the                            patient. Return to normal activities tomorrow.                            Written discharge instructions were provided to the                            patient.                           - Resume previous diet.                           - Continue present medications.                           - Would repeat CT abdomen with IV contrast in 6                            months to re-evaluate stomach. Jerene Bears, MD 10/22/2022 2:26:01 PM This report has been signed electronically.

## 2022-10-22 NOTE — Progress Notes (Signed)
Pt's states no medical or surgical changes since previsit or office visit. 

## 2022-10-22 NOTE — Progress Notes (Signed)
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  The nature of the procedure, as well as the risks, benefits, and alternatives were carefully and thoroughly reviewed with the patient. Ample time for discussion and questions allowed. The patient understood, was satisfied, and agreed to proceed.     HPI: Tyler Obrien is a 62 y.o. male who presents for EGD.  Medical history as below. No recent chest pain or shortness of breath.  No abdominal pain today.  Past Medical History:  Diagnosis Date   Allergy    Bleeding gastric ulcer    Blood transfusion without reported diagnosis    CVA (cerebral infarction) 2011   Diverticulosis    GERD (gastroesophageal reflux disease)    prilosec as needed- upset stomach mostly - started after gastric bleeding ulcer   Kidney stone    Stroke [facility]) 2011    Past Surgical History:  Procedure Laterality Date   APPENDECTOMY     COLONOSCOPY     10 yrs ago    cyst removal from scrotum     cyst removed from elbow     [facility]  08/2009   OPEN ANTERIOR SHOULDER RECONSTRUCTION     UPPER GASTROINTESTINAL ENDOSCOPY     10 yrs ago    WISDOM TOOTH EXTRACTION      Prior to Admission medications   Medication Sig Start Date End Date Taking? Authorizing Provider  Ascorbic Acid (VITAMIN C) 100 MG tablet Take 100 mg by mouth.   Yes [provider]  aspirin EC 81 MG tablet Take 81 mg by mouth daily.   Yes [provider]  b complex vitamins tablet Take by mouth. b 12 and folate combo   Yes [provider]  Cholecalciferol (VITAMIN D3) 75 MCG (3000 UT) TABS Take 1 tablet by mouth daily.   Yes [provider]  Coenzyme Q10 (COQ-10 PO) Take by mouth.   Yes [provider]   guaiFENesin (MUCINEX PO) Take by mouth. 800 mg bid   Yes [provider]  Omega-3 Fatty Acids (OMEGA 3 PO) Take by mouth. 1336 mg daily   Yes [provider]  omeprazole (PRILOSEC) 20 MG capsule Take 20 mg by mouth.   Yes [provider]  oxyCODONE-acetaminophen (PERCOCET) 5-325 MG tablet Take 2 tablets by mouth every 4 (four) hours as needed. 10/14/22  Yes Delo, Nathaneil Canary, MD  azelastine (ASTELIN) 0.1 % nasal spray Place 2 sprays into both nostrils 2 (two) times daily. 07/20/22   [provider]  HYDROcodone-acetaminophen (NORCO/VICODIN) 5-325 MG tablet Take 1 tablet by mouth every 6 (six) hours as needed. 05/23/22   [provider]  ipratropium (ATROVENT) 0.03 % nasal spray Place 2 sprays into both nostrils every 12 (twelve) hours. Patient not taking: Reported on 10/18/2022 09/07/18   Jacqulyn Cane, MD  Multiple Vitamin (MULTIVITAMIN) tablet Take 1 tablet by mouth daily.    [provider]    Current Outpatient Medications  Medication Sig Dispense Refill   Ascorbic Acid (VITAMIN C) 100 MG tablet Take 100 mg by mouth.     aspirin EC 81 MG tablet Take 81 mg by mouth daily.     b complex vitamins tablet Take by mouth. b 12 and  folate combo     Cholecalciferol (VITAMIN D3) 75 MCG (3000 UT) TABS Take 1 tablet by mouth daily.     Coenzyme Q10 (COQ-10 PO) Take by mouth.     guaiFENesin (MUCINEX PO) Take by mouth. 800 mg bid     Omega-3 Fatty Acids (OMEGA 3 PO) Take by mouth. 1336 mg daily     omeprazole (PRILOSEC) 20 MG capsule Take 20 mg by mouth.     oxyCODONE-acetaminophen (PERCOCET) 5-325 MG tablet Take 2 tablets by mouth every 4 (four) hours as needed. 20 tablet 0   azelastine (ASTELIN) 0.1 % nasal spray Place 2 sprays into both nostrils 2 (two) times daily.     HYDROcodone-acetaminophen (NORCO/VICODIN) 5-325 MG tablet Take 1 tablet by mouth every 6 (six) hours as needed.     ipratropium (ATROVENT) 0.03 % nasal spray Place 2 sprays into both  nostrils every 12 (twelve) hours. (Patient not taking: Reported on 10/18/2022) 30 mL 12   Multiple Vitamin (MULTIVITAMIN) tablet Take 1 tablet by mouth daily.     Current Facility-Administered Medications  Medication Dose Route Frequency Provider Last Rate Last Admin   0.9 %  sodium chloride infusion  500 mL Intravenous Once Jennilyn Esteve, Lajuan Lines, MD        Allergies as of 10/22/2022 - Review Complete 10/22/2022  Allergen Reaction Noted   Nsaids Other (See Comments) 11/22/2016    Family History  Problem Relation Age of Onset   Cancer Father        Lung CA   Hyperlipidemia Father    Hypertension Father    Lung cancer Father    Cancer Mother    Endometrial cancer Mother    Stomach cancer Maternal Grandfather    Colon cancer Neg Hx    Colon polyps Neg Hx    Esophageal cancer Neg Hx    Rectal cancer Neg Hx     Social History   Socioeconomic History   Marital status: Married    Spouse name: Not on file   Number of children: Not on file   Years of education: Not on file   Highest education level: Not on file  Occupational History   Not on file  Tobacco Use   Smoking status: Never   Smokeless tobacco: Former  Scientific laboratory technician Use: Never used  Substance and Sexual Activity   Alcohol use: Yes    Alcohol/week: 4.0 standard drinks of alcohol    Types: 4 Standard drinks or equivalent per week   Drug use: No   Sexual activity: Not on file  Other Topics Concern   Not on file  Social History Narrative   Not on file   Social Determinants of Health   Financial Resource Strain: Not on file  Food Insecurity: Not on file  Transportation Needs: Not on file  Physical Activity: Not on file  Stress: Not on file  Social Connections: Not on file  Intimate Partner Violence: Not on file    Physical Exam: Vital signs in last 24 hours: @BP  (!) 145/96   Pulse 74   Temp 97.8 F (36.6 C)   Resp 11   Ht 5\' 9"  (1.753 m)   Wt 195 lb (88.5 kg)   SpO2 98%   BMI 28.80 kg/m  GEN:  NAD EYE: Sclerae anicteric ENT: MMM CV: Non-tachycardic Pulm: CTA b/l GI: Soft, NT/ND NEURO:  Alert & Oriented x 3   Zenovia Jarred, MD [facility]  10/22/2022 2:13 PM

## 2022-10-22 NOTE — Patient Instructions (Addendum)
Resume previous diet and continue present medications. Recommended to repeat CT abdomen with IV contrast in 6 months to re-evaluate stomach.    YOU HAD AN ENDOSCOPIC PROCEDURE TODAY AT [facility]:   Refer to the procedure report that was given to you for any specific questions about what was found during the examination.  If the procedure report does not answer your questions, please call your gastroenterologist to clarify.  If you requested that your care partner not be given the details of your procedure findings, then the procedure report has been included in a sealed envelope for you to review at your convenience later.  YOU SHOULD EXPECT: Some feelings of bloating in the abdomen. Passage of more gas than usual.  Walking can help get rid of the air that was put into your GI tract during the procedure and reduce the bloating. If you had a lower endoscopy (such as a colonoscopy or flexible sigmoidoscopy) you may notice spotting of blood in your stool or on the toilet paper. If you underwent a bowel prep for your procedure, you may not have a normal bowel movement for a few days.  Please Note:  You might notice some irritation and congestion in your nose or some drainage.  This is from the oxygen used during your procedure.  There is no need for concern and it should clear up in a day or so.  SYMPTOMS TO REPORT IMMEDIATELY:  Following upper endoscopy (EGD)  Vomiting of blood or coffee ground material  New chest pain or pain under the shoulder blades  Painful or persistently difficult swallowing  New shortness of breath  Fever of 100F or higher  Black, tarry-looking stools  For urgent or emergent issues, a gastroenterologist can be reached at any hour by calling [phone]. Do not use MyChart messaging for urgent concerns.    DIET:  We do recommend a small meal at first, but then you may proceed to your regular diet.  Drink plenty of fluids but you should avoid  alcoholic beverages for 24 hours.  ACTIVITY:  You should plan to take it easy for the rest of today and you should NOT DRIVE or use heavy machinery until tomorrow (because of the sedation medicines used during the test).    FOLLOW UP: Our staff will call the number listed on your records the next business day following your procedure.  We will call around 7:15- 8:00 am to check on you and address any questions or concerns that you may have regarding the information given to you following your procedure. If we do not reach you, we will leave a message.     If any biopsies were taken you will be contacted by phone or by letter within the next 1-3 weeks.  Please call us at [phone] if you have not heard about the biopsies in 3 weeks.    SIGNATURES/CONFIDENTIALITY: You and/or your care partner have signed paperwork which will be entered into your electronic medical record.  These signatures attest to the fact that that the information above on your After Visit Summary has been reviewed and is understood.  Full responsibility of the confidentiality of this discharge information lies with you and/or your care-partner.

## 2022-10-22 NOTE — Progress Notes (Signed)
1318 Robinul 0.1 mg IV given due large amount of secretions upon assessment.  MD made aware, vss  

## 2022-10-22 NOTE — Progress Notes (Signed)
Report given to PACU, vss 

## 2022-10-23 ENCOUNTER — Telehealth: Payer: Self-pay

## 2022-10-23 NOTE — Telephone Encounter (Signed)
  Follow up Call-     10/22/2022    1:05 PM  Call back number  Post procedure Call Back phone  # [phone]  Permission to leave phone message Yes     Patient questions:  Do you have a fever, pain , or abdominal swelling? Yes.   Pain Score  0 *  Have you tolerated food without any problems? Yes  Have you been able to return to your normal activities? Yes.    Do you have any questions about your discharge instructions: Diet   No. Medications  No. Follow up visit  No.  Do you have questions or concerns about your Care? No.  Actions: * If pain score is 4 or above: No action needed, pain <4.

## 2022-11-05 ENCOUNTER — Ambulatory Visit: Payer: 59 | Admitting: Nurse Practitioner

## 2022-11-07 ENCOUNTER — Other Ambulatory Visit: Payer: Self-pay | Admitting: Urology

## 2022-11-12 NOTE — Progress Notes (Signed)
COVID Vaccine Completed:  Date of COVID positive in last 90 days:  PCP -  Cardiologist -   Chest x-ray -  EKG -  Stress Test -  ECHO -  Cardiac Cath -  Pacemaker/ICD device last checked: Spinal Cord Stimulator:  Bowel Prep -   Sleep Study -  CPAP -   Fasting Blood Sugar -  Checks Blood Sugar _____ times a day  Last dose of GLP1 agonist-  N/A GLP1 instructions:  N/A   Last dose of SGLT-2 inhibitors-  N/A SGLT-2 instructions: N/A   Blood Thinner Instructions: Aspirin Instructions: ASA 81 Last Dose:  Activity level:  Can go up a flight of stairs and perform activities of daily living without stopping and without symptoms of chest pain or shortness of breath.  Able to exercise without symptoms  Unable to go up a flight of stairs without symptoms of     Anesthesia review:   Patient denies shortness of breath, fever, cough and chest pain at PAT appointment  Patient verbalized understanding of instructions that were given to them at the PAT appointment. Patient was also instructed that they will need to review over the PAT instructions again at home before surgery.

## 2022-11-12 NOTE — Patient Instructions (Signed)
SURGICAL WAITING ROOM VISITATION  Patients having surgery or a procedure may have no more than 2 support people in the waiting area - these visitors may rotate.    Children under the age of 43 must have an adult with them who is not the patient.  Due to an increase in RSV and influenza rates and associated hospitalizations, children ages 31 and under may not visit patients in [place].  If the patient needs to stay at the hospital during part of their recovery, the visitor guidelines for inpatient rooms apply. Pre-op nurse will coordinate an appropriate time for 1 support person to accompany patient in pre-op.  This support person may not rotate.    Please refer to the [facility] website for the visitor guidelines for Inpatients (after your surgery is over and you are in a regular room).    Your procedure is scheduled on: 11/20/22   Report to [facility] Main Entrance    Report to admitting at 6:15 AM   Call this number if you have problems the morning of surgery [phone]   Do not eat food or drink liquids :After Midnight.          If you have questions, please contact your surgeon's office.   FOLLOW BOWEL PREP AND ANY ADDITIONAL PRE OP INSTRUCTIONS YOU RECEIVED FROM YOUR SURGEON'S OFFICE!!!     Oral Hygiene is also important to reduce your risk of infection.                                    Remember - BRUSH YOUR TEETH THE MORNING OF SURGERY WITH YOUR REGULAR TOOTHPASTE  DENTURES WILL BE REMOVED PRIOR TO SURGERY PLEASE DO NOT APPLY "Poly grip" OR ADHESIVES!!!   Take these medicines the morning of surgery with A SIP OF WATER: Omeprazole                               You may not have any metal on your body including jewelry, and body piercing             Do not wear lotions, powders, cologne, or deodorant  Do not shave  48 hours prior to surgery.               Men may shave face and neck.   Do not bring valuables to the hospital. [place].   Contacts, glasses, dentures or bridgework may not be worn into surgery.  DO NOT [place]. PHARMACY WILL DISPENSE MEDICATIONS LISTED ON YOUR MEDICATION LIST TO YOU DURING YOUR ADMISSION [place]!    Patients discharged on the day of surgery will not be allowed to drive home.  Someone NEEDS to stay with you for the first 24 hours after anesthesia.   Special Instructions: Bring a copy of your healthcare power of attorney and living will documents the day of surgery if you haven't scanned them before.              Please read over the following fact sheets you were given: IF [place] [phone] Schneiders    If you received a COVID test during your pre-op visit  it  is requested that you wear a mask when out in public, stay away from anyone that may not be feeling well and notify your surgeon if you develop symptoms. If you test positive for Covid or have been in contact with anyone that has tested positive in the last 10 days please notify you surgeon.    [place] - Preparing for Surgery Before surgery, you can play an important role.  Because skin is not sterile, your skin needs to be as free of germs as possible.  You can reduce the number of germs on your skin by washing with CHG (chlorahexidine gluconate) soap before surgery.  CHG is an antiseptic cleaner which kills germs and bonds with the skin to continue killing germs even after washing. Please DO NOT use if you have an allergy to CHG or antibacterial soaps.  If your skin becomes reddened/irritated stop using the CHG and inform your nurse when you arrive at Short Stay. Do not shave (including legs and underarms) for at least 48 hours prior to the first CHG shower.  You may shave your face/neck.  Please follow these instructions carefully:  1.  Shower with CHG Soap the night before surgery and the   morning of surgery.  2.  If you choose to wash your hair, wash your hair first as usual with your normal  shampoo.  3.  After you shampoo, rinse your hair and body thoroughly to remove the shampoo.                             4.  Use CHG as you would any other liquid soap.  You can apply chg directly to the skin and wash.  Gently with a scrungie or clean washcloth.  5.  Apply the CHG Soap to your body ONLY FROM THE NECK DOWN.   Do   not use on face/ open                           Wound or open sores. Avoid contact with eyes, ears mouth and   genitals (private parts).                       Wash face,  Genitals (private parts) with your normal soap.             6.  Wash thoroughly, paying special attention to the area where your    surgery  will be performed.  7.  Thoroughly rinse your body with warm water from the neck down.  8.  DO NOT shower/wash with your normal soap after using and rinsing off the CHG Soap.                9.  Pat yourself dry with a clean towel.            10.  Wear clean pajamas.            11.  Place clean sheets on your bed the night of your first shower and do not  sleep with pets. Day of Surgery : Do not apply any lotions/deodorants the morning of surgery.  Please wear clean clothes to the hospital/surgery center.  FAILURE TO FOLLOW THESE INSTRUCTIONS MAY RESULT IN THE CANCELLATION OF YOUR SURGERY  PATIENT SIGNATURE_________________________________  NURSE SIGNATURE__________________________________  ________________________________________________________________________

## 2022-11-14 ENCOUNTER — Encounter (HOSPITAL_COMMUNITY)
Admission: RE | Admit: 2022-11-14 | Discharge: 2022-11-14 | Disposition: A | Discharge status: Home / Self Care | Payer: 59 | Source: Ambulatory Visit | Attending: Urology | Admitting: Urology

## 2022-11-14 ENCOUNTER — Encounter (HOSPITAL_COMMUNITY): Payer: Self-pay

## 2022-11-14 VITALS — BP 135/95 | HR 75 | Temp 97.7°F | Resp 16 | Ht 69.0 in | Wt 193.0 lb

## 2022-11-14 DIAGNOSIS — Z01812 Encounter for preprocedural laboratory examination: Secondary | ICD-10-CM | POA: Insufficient documentation

## 2022-11-14 DIAGNOSIS — G988 Other disorders of nervous system: Secondary | ICD-10-CM

## 2022-11-14 HISTORY — DX: Unspecified osteoarthritis, unspecified site: M19.90

## 2022-11-14 LAB — CBC
HCT: 44.1 % (ref 39.0–52.0)
Hemoglobin: 13.5 g/dL (ref 13.0–17.0)
MCH: 25.2 pg — ABNORMAL LOW (ref 26.0–34.0)
MCHC: 30.6 g/dL (ref 30.0–36.0)
MCV: 82.4 fL (ref 80.0–100.0)
Platelets: 196 10*3/uL (ref 150–400)
RBC: 5.35 MIL/uL (ref 4.22–5.81)
RDW: 14.9 % (ref 11.5–15.5)
WBC: 6.8 10*3/uL (ref 4.0–10.5)
nRBC: 0 % (ref 0.0–0.2)

## 2022-11-14 LAB — BASIC METABOLIC PANEL
Anion gap: 9 (ref 5–15)
BUN: 19 mg/dL (ref 8–23)
CO2: 25 mmol/L (ref 22–32)
Calcium: 9 mg/dL (ref 8.9–10.3)
Chloride: 101 mmol/L (ref 98–111)
Creatinine, Ser: 1.09 mg/dL (ref 0.61–1.24)
GFR, Estimated: >60 mL/min (ref >60)
Glucose, Bld: 100 mg/dL — ABNORMAL HIGH (ref 70–99)
Potassium: 4.5 mmol/L (ref 3.5–5.1)
Sodium: 135 mmol/L (ref 135–145)

## 2022-11-19 NOTE — Anesthesia Preprocedure Evaluation (Addendum)
Anesthesia Evaluation  Patient identified by MRN, date of birth, ID band Patient awake    Reviewed: Allergy & Precautions, NPO status , Patient's Chart, lab work & pertinent test results  Airway Mallampati: I  TM Distance: >3 FB Neck ROM: Full    Dental  (+) Dental Advisory Given, Teeth Intact   Pulmonary neg pulmonary ROS   Pulmonary exam normal breath sounds clear to auscultation       Cardiovascular negative cardio ROS Normal cardiovascular exam Rhythm:Regular Rate:Normal     Neuro/Psych CVA, No Residual Symptoms    GI/Hepatic Neg liver ROS, PUD,GERD  Controlled,,  Endo/Other  negative endocrine ROS    Renal/GU Renal disease     Musculoskeletal  (+) Arthritis ,    Abdominal   Peds  Hematology negative hematology ROS (+)   Anesthesia Other Findings   Reproductive/Obstetrics                             Anesthesia Physical Anesthesia Plan  ASA: 3  Anesthesia Plan: General   Post-op Pain Management: Minimal or no pain anticipated   Induction: Intravenous  PONV Risk Score and Plan: 3 and Treatment may vary due to age or medical condition, Midazolam, Ondansetron and Dexamethasone  Airway Management Planned: LMA  Additional Equipment: None  Intra-op Plan:   Post-operative Plan: Extubation in OR  Informed Consent: I have reviewed the patients History and Physical, chart, labs and discussed the procedure including the risks, benefits and alternatives for the proposed anesthesia with the patient or authorized representative who has indicated his/her understanding and acceptance.     Dental advisory given  Plan Discussed with: CRNA  Anesthesia Plan Comments:         Anesthesia Quick Evaluation

## 2022-11-20 ENCOUNTER — Ambulatory Visit (HOSPITAL_COMMUNITY)
Admission: RE | Admit: 2022-11-20 | Discharge: 2022-11-20 | Disposition: A | Discharge status: Home / Self Care | Payer: 59 | Source: Ambulatory Visit | Attending: Urology | Admitting: Urology

## 2022-11-20 ENCOUNTER — Encounter (HOSPITAL_COMMUNITY)
Admission: RE | Disposition: A | Discharge status: Home / Self Care | Payer: Self-pay | Source: Ambulatory Visit | Attending: Urology

## 2022-11-20 ENCOUNTER — Ambulatory Visit (HOSPITAL_COMMUNITY): Payer: 59

## 2022-11-20 ENCOUNTER — Encounter (HOSPITAL_COMMUNITY): Payer: Self-pay | Admitting: Urology

## 2022-11-20 ENCOUNTER — Ambulatory Visit (HOSPITAL_BASED_OUTPATIENT_CLINIC_OR_DEPARTMENT_OTHER): Payer: 59 | Admitting: Anesthesiology

## 2022-11-20 ENCOUNTER — Other Ambulatory Visit: Payer: Self-pay

## 2022-11-20 ENCOUNTER — Ambulatory Visit (HOSPITAL_COMMUNITY): Payer: 59 | Admitting: Anesthesiology

## 2022-11-20 DIAGNOSIS — N132 Hydronephrosis with renal and ureteral calculous obstruction: Secondary | ICD-10-CM

## 2022-11-20 DIAGNOSIS — Z8673 Personal history of transient ischemic attack (TIA), and cerebral infarction without residual deficits: Secondary | ICD-10-CM

## 2022-11-20 DIAGNOSIS — K219 Gastro-esophageal reflux disease without esophagitis: Secondary | ICD-10-CM | POA: Insufficient documentation

## 2022-11-20 DIAGNOSIS — N289 Disorder of kidney and ureter, unspecified: Secondary | ICD-10-CM

## 2022-11-20 DIAGNOSIS — N400 Enlarged prostate without lower urinary tract symptoms: Secondary | ICD-10-CM | POA: Insufficient documentation

## 2022-11-20 DIAGNOSIS — M1990 Unspecified osteoarthritis, unspecified site: Secondary | ICD-10-CM

## 2022-11-20 HISTORY — PX: CYSTOSCOPY/URETEROSCOPY/HOLMIUM LASER/STENT PLACEMENT: SHX6546

## 2022-11-20 SURGERY — CYSTOSCOPY/URETEROSCOPY/HOLMIUM LASER/STENT PLACEMENT
Anesthesia: General | Laterality: Left

## 2022-11-20 MED ORDER — FENTANYL CITRATE (PF) 100 MCG/2ML IJ SOLN
INTRAMUSCULAR | Status: AC
  Filled 2022-11-20: qty 2

## 2022-11-20 MED ORDER — TAMSULOSIN HCL 0.4 MG PO CAPS: 0.4000 mg | ORAL_CAPSULE | Freq: Every day | ORAL | 0 refills | Status: DC

## 2022-11-20 MED ORDER — MIDAZOLAM HCL 5 MG/5ML IJ SOLN
INTRAMUSCULAR | Status: DC | PRN
  Administered 2022-11-20: 2 mg via INTRAVENOUS

## 2022-11-20 MED ORDER — HYOSCYAMINE SULFATE 0.125 MG PO TABS: 0.1250 mg | ORAL_TABLET | ORAL | 0 refills | Status: DC | PRN

## 2022-11-20 MED ORDER — ORAL CARE MOUTH RINSE: 15.0000 mL | Freq: Once | OROMUCOSAL | Status: AC

## 2022-11-20 MED ORDER — PROPOFOL 10 MG/ML IV BOLUS
INTRAVENOUS | Status: DC | PRN
  Administered 2022-11-20: 110 mg via INTRAVENOUS

## 2022-11-20 MED ORDER — DEXAMETHASONE SODIUM PHOSPHATE 10 MG/ML IJ SOLN
INTRAMUSCULAR | Status: DC | PRN
  Administered 2022-11-20: 10 mg via INTRAVENOUS

## 2022-11-20 MED ORDER — ONDANSETRON HCL 4 MG/2ML IJ SOLN
INTRAMUSCULAR | Status: AC
  Filled 2022-11-20: qty 2

## 2022-11-20 MED ORDER — PHENYLEPHRINE 80 MCG/ML (10ML) SYRINGE FOR IV PUSH (FOR BLOOD PRESSURE SUPPORT)
PREFILLED_SYRINGE | INTRAVENOUS | Status: DC | PRN
  Administered 2022-11-20 (×2): 80 ug via INTRAVENOUS

## 2022-11-20 MED ORDER — SULFAMETHOXAZOLE-TRIMETHOPRIM 800-160 MG PO TABS: 1.0000 | ORAL_TABLET | Freq: Two times a day (BID) | ORAL | 0 refills | Status: DC

## 2022-11-20 MED ORDER — LIDOCAINE 2% (20 MG/ML) 5 ML SYRINGE
INTRAMUSCULAR | Status: DC | PRN
  Administered 2022-11-20: 100 mg via INTRAVENOUS

## 2022-11-20 MED ORDER — DEXAMETHASONE SODIUM PHOSPHATE 10 MG/ML IJ SOLN
INTRAMUSCULAR | Status: AC
  Filled 2022-11-20: qty 1

## 2022-11-20 MED ORDER — IOHEXOL 300 MG/ML  SOLN
INTRAMUSCULAR | Status: DC | PRN
  Administered 2022-11-20: 8 mL

## 2022-11-20 MED ORDER — MIDAZOLAM HCL 2 MG/2ML IJ SOLN
INTRAMUSCULAR | Status: AC
  Filled 2022-11-20: qty 2

## 2022-11-20 MED ORDER — 0.9 % SODIUM CHLORIDE (POUR BTL) OPTIME
TOPICAL | Status: DC | PRN
  Administered 2022-11-20: 1000 mL

## 2022-11-20 MED ORDER — PROPOFOL 10 MG/ML IV BOLUS
INTRAVENOUS | Status: AC
  Filled 2022-11-20: qty 20

## 2022-11-20 MED ORDER — FENTANYL CITRATE (PF) 100 MCG/2ML IJ SOLN
INTRAMUSCULAR | Status: DC | PRN
  Administered 2022-11-20: 100 ug via INTRAVENOUS

## 2022-11-20 MED ORDER — LACTATED RINGERS IV SOLN: INTRAVENOUS | Status: DC

## 2022-11-20 MED ORDER — ONDANSETRON HCL 4 MG/2ML IJ SOLN
INTRAMUSCULAR | Status: DC | PRN
  Administered 2022-11-20: 4 mg via INTRAVENOUS

## 2022-11-20 MED ORDER — CEFAZOLIN SODIUM-DEXTROSE 2-4 GM/100ML-% IV SOLN
2.0000 g | INTRAVENOUS | Status: AC
  Administered 2022-11-20: 2 g via INTRAVENOUS
  Filled 2022-11-20: qty 100

## 2022-11-20 MED ORDER — PHENAZOPYRIDINE HCL 200 MG PO TABS: 200.0000 mg | ORAL_TABLET | Freq: Three times a day (TID) | ORAL | 1 refills | Status: DC | PRN

## 2022-11-20 MED ORDER — OXYCODONE HCL 5 MG PO TABS
5.0000 mg | ORAL_TABLET | Freq: Once | ORAL | Status: AC
  Administered 2022-11-20: 5 mg via ORAL

## 2022-11-20 MED ORDER — CHLORHEXIDINE GLUCONATE 0.12 % MT SOLN
15.0000 mL | Freq: Once | OROMUCOSAL | Status: AC
  Administered 2022-11-20: 15 mL via OROMUCOSAL

## 2022-11-20 MED ORDER — OXYCODONE HCL 5 MG PO TABS: 5.0000 mg | ORAL_TABLET | Freq: Four times a day (QID) | ORAL | 0 refills | Status: DC | PRN

## 2022-11-20 MED ORDER — OXYCODONE HCL 5 MG PO TABS
ORAL_TABLET | ORAL | Status: AC
  Filled 2022-11-20: qty 1

## 2022-11-20 MED ORDER — SODIUM CHLORIDE 0.9 % IR SOLN
Status: DC | PRN
  Administered 2022-11-20: 3000 mL via INTRAVESICAL

## 2022-11-20 SURGICAL SUPPLY — 25 items
BAG COUNTER SPONGE SURGICOUNT (BAG) IMPLANT
BAG URO CATCHER STRL LF (MISCELLANEOUS) ×1 IMPLANT
BASKET ZERO TIP NITINOL 2.4FR (BASKET) IMPLANT
CATH URETERAL DUAL LUMEN 10F (MISCELLANEOUS) ×1 IMPLANT
CATH URETL OPEN END 6FR 70 (CATHETERS) IMPLANT
CLOTH BEACON ORANGE TIMEOUT ST (SAFETY) ×1 IMPLANT
EXTRACTOR STONE 1.7FRX115CM (UROLOGICAL SUPPLIES) IMPLANT
GLOVE SS BIOGEL STRL SZ 7 (GLOVE) ×1 IMPLANT
GLOVE SURG LX STRL 7.5 STRW (GLOVE) ×1 IMPLANT
GOWN STRL REUS W/ TWL XL LVL3 (GOWN DISPOSABLE) ×1 IMPLANT
GOWN STRL REUS W/TWL XL LVL3 (GOWN DISPOSABLE) ×1
GUIDEWIRE STR DUAL SENSOR (WIRE) ×1 IMPLANT
GUIDEWIRE ZIPWRE .038 STRAIGHT (WIRE) ×1 IMPLANT
IV NS 1000ML (IV SOLUTION)
IV NS 1000ML BAXH (IV SOLUTION) ×1 IMPLANT
KIT TURNOVER KIT A (KITS) IMPLANT
LASER FIB FLEXIVA PULSE ID 365 (Laser) IMPLANT
MANIFOLD NEPTUNE II (INSTRUMENTS) ×1 IMPLANT
PACK CYSTO (CUSTOM PROCEDURE TRAY) ×1 IMPLANT
SHEATH NAVIGATOR HD 11/13X36 (SHEATH) IMPLANT
STENT URET 6FRX26 CONTOUR (STENTS) IMPLANT
TRACTIP FLEXIVA PULS ID 200XHI (Laser) IMPLANT
TRACTIP FLEXIVA PULSE ID 200 (Laser) ×1
TUBING CONNECTING 10 (TUBING) ×1 IMPLANT
TUBING UROLOGY SET (TUBING) ×1 IMPLANT

## 2022-11-20 NOTE — Transfer of Care (Signed)
Immediate Anesthesia Transfer of Care Note  Patient: Tyler Obrien  Procedure(s) Performed: LEFT URETEROSCOPY/HOLMIUM LASER/STENT PLACEMENT (Left)  Patient Location: PACU  Anesthesia Type:General  Level of Consciousness: sedated  Airway & Oxygen Therapy: Patient Spontanous Breathing and Patient connected to face mask oxygen  Post-op Assessment: Report given to RN and Post -op Vital signs reviewed and stable  Post vital signs: Reviewed and stable  Last Vitals:  Vitals Value Taken Time  BP 105/74 11/20/22 0934  Temp    Pulse    Resp 12 11/20/22 0934  SpO2    Vitals shown include unvalidated device data.  Last Pain:  Vitals:   11/20/22 0647  TempSrc:   PainSc: 0-No pain      Patients Stated Pain Goal: 3 ([id number])  Complications: No notable events documented.

## 2022-11-20 NOTE — Anesthesia Postprocedure Evaluation (Signed)
Anesthesia Post Note  Patient: Tyler Obrien  Procedure(s) Performed: LEFT URETEROSCOPY/HOLMIUM LASER/STENT PLACEMENT (Left)     Patient location during evaluation: PACU Anesthesia Type: General Level of consciousness: sedated and patient cooperative Pain management: pain level controlled Vital Signs Assessment: post-procedure vital signs reviewed and stable Respiratory status: spontaneous breathing Cardiovascular status: stable Anesthetic complications: no   No notable events documented.  Last Vitals:  Vitals:   11/20/22 1000 11/20/22 1014  BP: 126/87 (!) 142/91  Pulse: 74 70  Resp: 14 16  Temp: (!) 36.1 C   SpO2: 96% 97%    Last Pain:  Vitals:   11/20/22 0945  TempSrc:   PainSc: [place]

## 2022-11-20 NOTE — Discharge Instructions (Signed)

## 2022-11-20 NOTE — H&P (Signed)
H&P  History of Present Illness: Tyler Obrien is a 62 y.o. year old M who presents today for left ureteroscopy for a left distal ureteral stone  No acute complaints. Has not passed the stone.  Past Medical History:  Diagnosis Date   Allergy    Arthritis    Bleeding gastric ulcer    Blood transfusion without reported diagnosis    CVA (cerebral infarction) 2011   Diverticulosis    GERD (gastroesophageal reflux disease)    prilosec as needed- upset stomach mostly - started after gastric bleeding ulcer   Kidney stone    Stroke [facility]) 2011    Past Surgical History:  Procedure Laterality Date   APPENDECTOMY     COLONOSCOPY     10 yrs ago    cyst removal from scrotum     cyst removed from elbow     FINGER FRACTURE SURGERY Right    5th finger   KIDNEY STONE SURGERY     NASAL SINUS SURGERY  08/2009   OPEN ANTERIOR SHOULDER RECONSTRUCTION     UPPER GASTROINTESTINAL ENDOSCOPY     10 yrs ago    WISDOM TOOTH EXTRACTION      Home Medications:  Current Meds  Medication Sig   Ascorbic Acid (VITAMIN C) 100 MG tablet Take 100 mg by mouth daily.   aspirin EC 81 MG tablet Take 81 mg by mouth daily.   azelastine (ASTELIN) 0.1 % nasal spray Place 2 sprays into both nostrils 2 (two) times daily as needed for allergies or rhinitis.   b complex vitamins tablet Take 1 tablet by mouth daily.   Cholecalciferol (VITAMIN D3) 75 MCG (3000 UT) TABS Take 3,000 Units by mouth daily.   Coenzyme Q10 (COQ-10 PO) Take 1 capsule by mouth daily.   guaiFENesin (MUCINEX) 600 MG 12 hr tablet Take 600 mg by mouth 2 (two) times daily as needed for cough or to loosen phlegm.   Multiple Vitamin (MULTIVITAMIN) tablet Take 1 tablet by mouth daily.   Omega-3 Fatty Acids (OMEGA 3 PO) Take 1 capsule by mouth daily.   omeprazole (PRILOSEC) 20 MG capsule Take 20 mg by mouth daily as needed (acid reflux).    Allergies:  Allergies  Allergen Reactions   Nsaids Other (See Comments)    ULCER    Family History   Problem Relation Age of Onset   Cancer Father        Lung CA   Hyperlipidemia Father    Hypertension Father    Lung cancer Father    Cancer Mother    Endometrial cancer Mother    Stomach cancer Maternal Grandfather    Colon cancer Neg Hx    Colon polyps Neg Hx    Esophageal cancer Neg Hx    Rectal cancer Neg Hx     Social History:  reports that he has never smoked. He has quit using smokeless tobacco. He reports that he does not currently use alcohol. He reports that he does not use drugs.  ROS: A complete review of systems was performed.  All systems are negative except for pertinent findings as noted.  Physical Exam:  Vital signs in last 24 hours: Temp:  [97.7 F (36.5 C)] 97.7 F (36.5 C) (02/14 [id number]) Pulse Rate:  [75] 75 (02/14 0638) Resp:  [18] 18 (02/14 0638) BP: (132)/(90) 132/90 (02/14 0638) SpO2:  [96 %] 96 % (02/14 [id number]) Weight:  [87.5 kg] 87.5 kg (02/14 0647) Constitutional:  Alert and oriented, No acute distress Cardiovascular:  Regular rate and rhythm Respiratory: Normal respiratory effort, Lungs clear bilaterally GI: Abdomen is soft, nontender, nondistended, no abdominal masses Lymphatic: No lymphadenopathy Neurologic: Grossly intact, no focal deficits Psychiatric: Normal mood and affect   Laboratory Data:  No results for input(s): "WBC", "HGB", "HCT", "PLT" in the last 72 hours.  No results for input(s): "NA", "K", "CL", "GLUCOSE", "BUN", "CALCIUM", "CREATININE" in the last 72 hours.  Invalid input(s): "CO3"   No results found for this or any previous visit (from the past 24 hour(s)). No results found for this or any previous visit (from the past 240 hour(s)).  Renal Function: Recent Labs    11/14/22 0830  CREATININE 1.09   Estimated Creatinine Clearance: 75.9 mL/min (by C-G formula based on SCr of 1.09 mg/dL).  Radiologic Imaging: No results found.  Assessment:  Tyler Obrien is a 62 y.o. year old with a distal left ureteral  stone  Plan:  To OR for left ureteroscopy with laser litho, stent. Procedure and risks reviewed  Donald Pore, MD 11/20/2022, 8:15 AM  [facility] Pager: [phone]

## 2022-11-20 NOTE — Anesthesia Procedure Notes (Signed)
Procedure Name: LMA Insertion Date/Time: 11/20/2022 8:33 AM  Performed by: Lind Covert, CRNAPre-anesthesia Checklist: Patient identified, Emergency Drugs available, Suction available, Patient being monitored and Timeout performed Patient Re-evaluated:Patient Re-evaluated prior to induction Oxygen Delivery Method: Circle system utilized Preoxygenation: Pre-oxygenation with 100% oxygen Induction Type: IV induction LMA: LMA inserted LMA Size: 4.0 Tube type: Oral Number of attempts: 1 Placement Confirmation: positive ETCO2 and breath sounds checked- equal and bilateral Tube secured with: Tape Dental Injury: Teeth and Oropharynx as per pre-operative assessment

## 2022-11-20 NOTE — Op Note (Signed)
Operative Note  Preoperative diagnosis:  1.  Left distal ureteral stones  Postoperative diagnosis: 1.  Same 2. Left hydroureteronephrosis  Procedure(s): 1.  Left ureteroscopy with laser lithotripsy and basket extraction of stones 2. Cystoscopy  3. Left retrograde pyelogram 4. Left ureteral stent placement 5. Fluoroscopy with intraoperative interpretation  Surgeon: Donald Pore, MD  Assistants:  None  Anesthesia:  General  Complications:  None  EBL:  Minimal  Specimens: 1. stones for stone analysis (to be done at [facility])  Drains/Catheters: 1.  Left 6Fr x 26cm ureteral stent without tether string  Intraoperative findings:   Cystoscopy demonstrated some bilobar enlargement of his prostate Left Ureteroscopy demonstrated multiple stones in the distal left ureter Successful stent placement.  Indication:  Tyler Obrien is a 62 y.o. male with the above diagnoses.  Description of procedure: After informed consent was obtained from the patient, the patient was identified and taken to the operating room and placed in the supine position.  General anesthesia was administered as well as perioperative IV antibiotics.  At the beginning of the case, a time-out was performed to properly identify the patient, the surgery to be performed, and the surgical site.  Sequential compression devices were applied to the lower extremities at the beginning of the case for DVT prophylaxis.  The patient was then placed in the dorsal lithotomy supine position, prepped and draped in sterile fashion.  Preliminary scout fluoroscopy revealed that there was a calcification area at the left distal ureter, which corresponds to preoperative CT scan. We then passed the 21-French rigid cystoscope through the urethra and into the bladder under vision without any difficulty, noting a normal urethra without strictures and a mildly obstructing prostate.  A systematic evaluation of the bladder revealed no  evidence of any suspicious bladder lesions.  Ureteral orifices were in normal position.    Under cystoscopic and flouroscopic guidance, we cannulated the left ureteral orifice with a 5-French open-ended ureteral catheter and a gentle retrograde pyelogram was performed, revealing a normal caliber ureter without any filling defects. There was hydronephrosis of the collecting system and ureter. There was a filling defect in the distal ureter corresponding to the stone. A 0.038 glide wire was then passed up to the level of the renal pelvis and secured to the drape as a safety wire. The ureteral catheter and cystoscope were removed, leaving the safety wire in place.   A semi-rigid ureteroscope was passed alongside the wire up the distal ureter which appeared normal. There were several stones in the distal ureter - the largest was somewhat impacted in the distal ureter. These fragmented with the holmium laser. The fragments were removed with the N-gage basket under direct visualization.  The semi-rigid ureteroscope was removed. the Glidewire was backloaded through the rigid cystoscope, which was then advanced down the urethra and into the bladder. We then used the Glidewire under direct vision through the rigid cystoscope and under fluoroscopic guidance and passed up a 6-French, 26 cm double-pigtail ureteral stent up ureter, making sure that the proximal and distal ends coiled within the kidney and bladder respectively.  The cystoscope was then advanced back into the bladder under vision.  We were able to see the distal stent coiling nicely within the bladder.  The bladder was then emptied with irrigation solution.  The cystoscope was then removed.    The patient tolerated the procedure well and there was no complication. Patient was awoken from anesthesia and taken to the recovery room in stable condition.  I was present and scrubbed for the entirety of the case.  Plan:  Patient will be discharged home with  plans to f/u in 2 weeks for stent removal  G. Donald Pore MD [facility]  Pager: [phone]

## 2022-11-21 ENCOUNTER — Encounter (HOSPITAL_COMMUNITY): Payer: Self-pay | Admitting: Urology

## 2022-12-09 ENCOUNTER — Ambulatory Visit
Admission: RE | Admit: 2022-12-09 | Discharge: 2022-12-09 | Disposition: A | Discharge status: Home / Self Care | Payer: 59 | Source: Ambulatory Visit | Attending: Urgent Care | Admitting: Urgent Care

## 2022-12-09 ENCOUNTER — Ambulatory Visit (INDEPENDENT_AMBULATORY_CARE_PROVIDER_SITE_OTHER): Payer: 59

## 2022-12-09 VITALS — BP 171/85 | HR 86 | Temp 97.6°F | Resp 16

## 2022-12-09 DIAGNOSIS — M25512 Pain in left shoulder: Secondary | ICD-10-CM

## 2022-12-09 DIAGNOSIS — M436 Torticollis: Secondary | ICD-10-CM

## 2022-12-09 DIAGNOSIS — M7918 Myalgia, other site: Secondary | ICD-10-CM

## 2022-12-09 DIAGNOSIS — M542 Cervicalgia: Secondary | ICD-10-CM

## 2022-12-09 MED ORDER — CARISOPRODOL 350 MG PO TABS: 350.0000 mg | ORAL_TABLET | Freq: Three times a day (TID) | ORAL | 0 refills | Status: DC

## 2022-12-09 MED ORDER — ACETAMINOPHEN-CODEINE 300-30 MG PO TABS: 1.0000 | ORAL_TABLET | Freq: Four times a day (QID) | ORAL | 0 refills | Status: AC | PRN

## 2022-12-09 NOTE — ED Provider Notes (Signed)
[facility]    CSN: [id number] Arrival date & time: 12/09/22  1729      History   Chief Complaint Chief Complaint  Patient presents with   Back Pain    upper back     HPI Tyler Obrien is a 62 y.o. male.   62 year old patient presents today due to concern of left-sided neck and trap pain.  He states 3 weeks or so ago he had a stent placed to his ureter due to history of kidney stones.  Ever since the recovery, he states he been relaxing and recovering in an odd position and feels that this induced some issues to his neck and left shoulder.  He reports inability to extend his neck upwards, and pain with lateral rotation to the right.  Patient also states that some tingling burning discomfort to his left arm in the tricep region.  He did a telemetry doc appointment 1 week ago and was prescribed prednisone and lidocaine patches.  Unfortunately the lidocaine burned patient's skin and he had to remove it.  Patient also states that the prednisone caused such severe tachycardia that he could not take it.  Patient has a history of bleeding gastric ulcer several years ago and thus avoids NSAIDs as well.  Has been taking Tylenol without improvement to his symptoms.  Does report history of neck issues in the past from prior sports related injuries, but the change in movement of his neck and radicular symptoms on the left are new as of 2 to 3 weeks ago.   Back Pain   Past Medical History:  Diagnosis Date   Allergy    Arthritis    Bleeding gastric ulcer    Blood transfusion without reported diagnosis    CVA (cerebral infarction) 2011   Diverticulosis    GERD (gastroesophageal reflux disease)    prilosec as needed- upset stomach mostly - started after gastric bleeding ulcer   Kidney stone    Stroke [facility]) 2011    Patient Active Problem List   Diagnosis Date Noted   DDD (degenerative disc disease), cervical 03/24/2019   Right shoulder pain 02/24/2019   Cyst of scrotum  08/27/2016   Bladder pain 08/27/2016   Microcytosis 08/27/2016   Vitamin D deficiency 08/27/2016   Ethmoid sinusitis 08/27/2016   Kidney stone 08/21/2015    Past Surgical History:  Procedure Laterality Date   APPENDECTOMY     COLONOSCOPY     10 yrs ago    cyst removal from scrotum     cyst removed from elbow     CYSTOSCOPY/URETEROSCOPY/HOLMIUM LASER/STENT PLACEMENT Left 11/20/2022   Procedure: LEFT URETEROSCOPY/HOLMIUM LASER/STENT PLACEMENT;  Surgeon: Vira Agar, MD;  Location: [facility];  Service: Urology;  Laterality: Left;   FINGER FRACTURE SURGERY Right    5th finger   KIDNEY STONE SURGERY     NASAL SINUS SURGERY  08/2009   OPEN ANTERIOR SHOULDER RECONSTRUCTION     UPPER GASTROINTESTINAL ENDOSCOPY     10 yrs ago    Cutter Medications    Prior to Admission medications   Medication Sig Start Date End Date Taking? Authorizing Provider  acetaminophen-codeine (TYLENOL #3) 300-30 MG tablet Take 1-2 tablets by mouth every 6 (six) hours as needed for up to 3 days for moderate pain. 12/09/22 12/12/22 Yes Vincy Feliz L, PA  carisoprodol (SOMA) 350 MG tablet Take 1 tablet (350 mg total) by mouth 3 (three) times  daily. 12/09/22  Yes Jaime Grizzell L, PA  Ascorbic Acid (VITAMIN C) 100 MG tablet Take 100 mg by mouth daily.    [provider]  aspirin EC 81 MG tablet Take 81 mg by mouth daily.    [provider]  azelastine (ASTELIN) 0.1 % nasal spray Place 2 sprays into both nostrils 2 (two) times daily as needed for allergies or rhinitis. 07/20/22   [provider]  b complex vitamins tablet Take 1 tablet by mouth daily.    [provider]  Cholecalciferol (VITAMIN D3) 75 MCG (3000 UT) TABS Take 3,000 Units by mouth daily.    [provider]  Coenzyme Q10 (COQ-10 PO) Take 1 capsule by mouth daily.    [provider]  guaiFENesin (MUCINEX) 600 MG 12 hr tablet Take 600 mg by mouth 2 (two) times daily as  needed for cough or to loosen phlegm.    [provider]  hyoscyamine (LEVSIN) 0.125 MG tablet Take 1 tablet (0.125 mg total) by mouth every 4 (four) hours as needed. 11/20/22   Vira Agar, MD  Multiple Vitamin (MULTIVITAMIN) tablet Take 1 tablet by mouth daily.    [provider]  Omega-3 Fatty Acids (OMEGA 3 PO) Take 1 capsule by mouth daily.    [provider]  omeprazole (PRILOSEC) 20 MG capsule Take 20 mg by mouth daily as needed (acid reflux).    [provider]  phenazopyridine (PYRIDIUM) 200 MG tablet Take 1 tablet (200 mg total) by mouth 3 (three) times daily as needed (dysuria). 11/20/22 11/20/23  Vira Agar, MD  sulfamethoxazole-trimethoprim (BACTRIM DS) 800-160 MG tablet Take 1 tablet by mouth 2 (two) times daily. 11/20/22   Vira Agar, MD  tamsulosin (FLOMAX) 0.4 MG CAPS capsule Take 1 capsule (0.4 mg total) by mouth daily after supper. 11/20/22   Vira Agar, MD    Family History Family History  Problem Relation Age of Onset   Cancer Father        Lung CA   Hyperlipidemia Father    Hypertension Father    Lung cancer Father    Cancer Mother    Endometrial cancer Mother    Stomach cancer Maternal Grandfather    Colon cancer Neg Hx    Colon polyps Neg Hx    Esophageal cancer Neg Hx    Rectal cancer Neg Hx     Social History Social History   Tobacco Use   Smoking status: Never   Smokeless tobacco: Former  Scientific laboratory technician Use: Never used  Substance Use Topics   Alcohol use: Not Currently   Drug use: No     Allergies   Nsaids   Review of Systems Review of Systems  Musculoskeletal:  Positive for back pain.  As per HPI   Physical Exam Triage Vital Signs ED Triage Vitals  Enc Vitals Group     BP 12/09/22 1747 (!) 171/85     Pulse Rate 12/09/22 1747 86     Resp 12/09/22 1747 16     Temp 12/09/22 1747 97.6 F (36.4 C)     Temp Source 12/09/22 1747 Oral     SpO2 12/09/22 1747 96 %     Weight --       Height --      Head Circumference --      Peak Flow --      Pain Score 12/09/22 1749 7     Pain Loc --  Pain Edu? --      Excl. in [place]? --    No data found.  Updated Vital Signs BP (!) 171/85 (BP Location: Right Arm)   Pulse 86   Temp 97.6 F (36.4 C) (Oral)   Resp 16   SpO2 96%   Visual Acuity Right Eye Distance:   Left Eye Distance:   Bilateral Distance:    Right Eye Near:   Left Eye Near:    Bilateral Near:     Physical Exam Vitals and nursing note reviewed.  Constitutional:      General: He is not in acute distress.    Appearance: Normal appearance. He is normal weight. He is not ill-appearing or toxic-appearing.  Neck:     Thyroid: No thyroid mass or thyromegaly.     Trachea: Trachea and phonation normal.  Musculoskeletal:        General: Tenderness (reproducible muscular tenderness to L trap; burn mark to L trap from recent lidocaine patch without evidence of infection) present.     Cervical back: Torticollis present. No erythema. Pain with movement and muscular tenderness (L scalenes and L trap) present. No spinous process tenderness. Decreased range of motion.  Lymphadenopathy:     Cervical: No cervical adenopathy.     Right cervical: No superficial, deep or posterior cervical adenopathy.    Left cervical: No superficial, deep or posterior cervical adenopathy.  Skin:    General: Skin is warm.  Neurological:     Mental Status: He is alert and oriented to person, place, and time.     Sensory: No sensory deficit.     Motor: Weakness (L arm extension 4/5 strength, R arm 5/5. No other weakness or deficits noted) present.     Coordination: Coordination normal.      UC Treatments / Results  Labs (all labs ordered are listed, but only abnormal results are displayed) Labs Reviewed - No data to display  EKG   Radiology DG Cervical Spine Complete  Result Date: 12/09/2022 CLINICAL DATA:  Left-sided neck and shoulder pain for the past 3 weeks. No  injury. EXAM: CERVICAL SPINE - COMPLETE 4+ VIEW COMPARISON:  Cervical spine x-rays dated March 24, 2019. FINDINGS: The lateral view is diagnostic to the C7-T1 level. There is no acute fracture or subluxation. Vertebral body heights are preserved. Unchanged mild reversal of the normal cervical lordosis. No significant listhesis. Mildly progressive moderate disc height loss and uncovertebral hypertrophy from C3-C4 through C5-C6. Worsening moderate right and unchanged moderate left neuroforaminal stenosis at C4-C5. Unchanged mild bilateral neuroforaminal stenosis at C3-C4. Normal prevertebral soft tissues. IMPRESSION: 1. Mildly progressive moderate multilevel cervical spondylosis as described above. Electronically Signed   By: Titus Dubin M.D.   On: 12/09/2022 18:50    Procedures Procedures (including critical care time)  Medications Ordered in UC Medications - No data to display  Initial Impression / Assessment and Plan / UC Course  I have reviewed the triage vital signs and the nursing notes.  Pertinent labs & imaging results that were available during my care of the patient were reviewed by me and considered in my medical decision making (see chart for details).     Acute torticollis -recommended patient start with the Soma to help with muscle relaxation and discomfort.  Will also have patient apply moist heat to the area.  Patient cannot tolerate NSAIDs due to history of bleeding gastric ulcers. Myofascial pain -most DC lidocaine patches due to skin intolerance.  Treatment as above.  Will add  Tylenol with codeine as needed for acute pain.  Neuroforaminal stenosis -question if this is affecting some of his strength on the left.  Recommended patient follow-up with his PCP for further evaluation and referrals.   Final Clinical Impressions(s) / UC Diagnoses   Final diagnoses:  Acute torticollis  Myofascial pain syndrome, cervical     Discharge Instructions      Please take the Soma up  to 3 times daily as needed.  This is a muscle relaxer and may make you feel slightly drowsy.   This should help with muscle relaxation and also pain.  If your symptoms are not controlled with Soma alone, you may also take the Tylenol with codeine.  Do not exceed 3 g of Tylenol from all sources in a 24-hour period.  We will call with the results of the xray if anything additional needs to be discussed.     ED Prescriptions     Medication Sig Dispense Auth. Provider   carisoprodol (SOMA) 350 MG tablet Take 1 tablet (350 mg total) by mouth 3 (three) times daily. 15 tablet Jerika Wales L, PA   acetaminophen-codeine (TYLENOL #3) 300-30 MG tablet Take 1-2 tablets by mouth every 6 (six) hours as needed for up to 3 days for moderate pain. 12 tablet Jabaree Mercado L, [place]      I have reviewed the PDMP during this encounter.   Chaney Malling, [place] 12/09/22 2053

## 2022-12-09 NOTE — Discharge Instructions (Addendum)
Please take the Soma up to 3 times daily as needed.  This is a muscle relaxer and may make you feel slightly drowsy.   This should help with muscle relaxation and also pain.  If your symptoms are not controlled with Soma alone, you may also take the Tylenol with codeine.  Do not exceed 3 g of Tylenol from all sources in a 24-hour period.  We will call with the results of the xray if anything additional needs to be discussed.

## 2022-12-09 NOTE — ED Triage Notes (Addendum)
Pt c/o upper back pain x 3 weeks. Denies injury. Teledoc on Friday. Rx'd lidocaine patch and prednisone with no relief. Tylenol prn.

## 2022-12-30 ENCOUNTER — Encounter: Payer: Self-pay | Admitting: Medical

## 2022-12-30 ENCOUNTER — Ambulatory Visit: Payer: 59 | Admitting: Medical

## 2022-12-30 VITALS — BP 139/80 | HR 97 | Temp 98.0°F | Ht 69.0 in | Wt 193.4 lb

## 2022-12-30 DIAGNOSIS — Z8673 Personal history of transient ischemic attack (TIA), and cerebral infarction without residual deficits: Secondary | ICD-10-CM

## 2022-12-30 DIAGNOSIS — R000 Tachycardia, unspecified: Secondary | ICD-10-CM

## 2022-12-30 DIAGNOSIS — M542 Cervicalgia: Secondary | ICD-10-CM

## 2022-12-30 DIAGNOSIS — R5383 Other fatigue: Secondary | ICD-10-CM | POA: Diagnosis not present

## 2022-12-30 DIAGNOSIS — D649 Anemia, unspecified: Secondary | ICD-10-CM

## 2022-12-30 DIAGNOSIS — E785 Hyperlipidemia, unspecified: Secondary | ICD-10-CM | POA: Diagnosis not present

## 2022-12-30 NOTE — Patient Instructions (Addendum)
   1. Tachycardia Intermittent tachycardia recently up to the 130 range when you are on steroids for neck pain.  Also description of remote event years ago when pulse went up to 190 on short walk.  Based on this and history of stroke I do think is best to refer you to cardiologist for probable Zio patch.  Will place that referral today. - EKG 12-Lead(normal sinus rhythm)  2. Fatigue, unspecified type Placing future labs to get scheduled within the next week.  Please a schedule on the way out. - Comp Met (CMET); Future - B12; Future - Vitamin B1; Future - TSH; Future - Iron; Future - CBC w/Diff; Future  3. History of stroke No cause found years ago when you had a stroke.  Will need to evaluate cardiovascular risk factors/minimize those in the future going forward.  4. Hyperlipidemia, unspecified hyperlipidemia type Future fasting lipid panel placed today. - Lipid panel; Future - Comp Met (CMET); Future  5. Neck pain Can continue Tylenol.  Pain is not tolerable but still present.  If you want I can offer you referral to sports medicine as with duration of pain MRI may be indicated.  For allergic rhinitis/conjunctivitis continue Pataday and Astepro.  For rare occasional GERD continue omeprazole and healthy diet.  Your blood pressure is mildly elevated today.  Recommend that you get over-the-counter blood pressure monitor and check your blood pressure on occasion when relaxed at home.  Verify blood pressure tightly controlled.  Follow-up date to be determined after lab review.

## 2022-12-30 NOTE — Progress Notes (Addendum)
Subjective:    Patient ID: Tyler Obrien, male    DOB: 02/25/1961, 62 y.o.   MRN: [id number]  HPI  Pt in for first time. 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 No recent exercising. Works Corporate investment banker. Non smoker. Alcohol- very rare   He states kidney stones was getting in way of his exercise. Most of time he eats healthy. Recent weight gain.    Jerrye Bushy- states only ppi only as needed. He takes omeprazoles only as needed. He has hx of bleeding ulcer.(Years ago in 1992)   Mild elevated sugar in past.   Allergic rhinitis- allergies to grass and oak trees. Pt is on pataday eye drops and astepro.    Pt stats neck pain and radicular pain has lessened. Has better range of motion now. Neck pain and radicular pain almost 8 weeks. Pt has used tylenol. Occasional low dose iburprofen. Tried wife gabapetin and did not help.  Pt states while he was on steroids for his neck pain his pulse got up to 130 range end of February. He states he really did not feel his heart rate but states he was having a lot of neck pain.. Occasionally in the past 2 years on short walk is heart rate got up to 190 smart watch. Pt former pcp never referred him for ziopatch.  Elevated bp- he states in past usually bp 123456 systlic range.      Today reports still having some left shoulder area pain and neck pain. A/P Acute torticollis -recommended patient start with the Soma to help with muscle relaxation and discomfort.  Will also have patient apply moist heat to the area.  Patient cannot tolerate NSAIDs due to history of bleeding gastric ulcers. Myofascial pain -most DC lidocaine patches due to skin intolerance.  Treatment as above.  Will add Tylenol with codeine as needed for acute pain.  Neuroforaminal stenosis -question if this is affecting some of his strength on the left.  Recommended patient follow-up with his PCP for further evaluation and referrals.  His neck and shoulder pain about  50-60% better. Some numbness to thumb index finger and mid finger. No further radicular pain.   12-09-2022.  Mildly progressive moderate disc height loss and uncovertebral hypertrophy from C3-C4 through C5-C6. Worsening moderate right and unchanged moderate left neuroforaminal stenosis at C4-C5. Unchanged mild bilateral neuroforaminal stenosis at C3-C4.    Nov 20, 2022 had urologic procedure.  Assessment:  Tyler Obrien is a 62 y.o. year old with a distal left ureteral stone   Plan:  To OR for left ureteroscopy with laser litho, stent. Procedure and risks review.   Hx of stroke. In 2011.  Review of Systems  Constitutional:  Positive for fatigue. Negative for chills, diaphoresis and fever.  HENT:  Negative for congestion, ear discharge and facial swelling.   Respiratory:  Negative for cough, chest tightness and shortness of breath.   Cardiovascular:  Negative for chest pain and palpitations.  Gastrointestinal:  Negative for abdominal pain, anal bleeding, blood in stool, nausea and vomiting.  Genitourinary:  Negative for dysuria, flank pain, frequency and hematuria.  Musculoskeletal:  Positive for neck pain. Negative for back pain, gait problem and myalgias.  Skin:  Negative for rash and wound.  Neurological:  Negative for seizures, facial asymmetry and numbness.  Hematological:  Negative for adenopathy. Does not bruise/bleed easily.  Psychiatric/Behavioral:  Negative for behavioral problems, confusion, hallucinations and sleep disturbance.        Objective:  Physical Exam  General Mental Status- Alert. General Appearance- Not in acute distress.   Skin General: Color- Normal Color. Moisture- Normal Moisture.  Neck No mid cervical spine tenderness to palpation.  No stiffness.  Chest and Lung Exam Auscultation: Breath Sounds:-Normal.  Cardiovascular Auscultation:Rythm- Regular. Murmurs & Other Heart Sounds:Auscultation of the heart reveals- No  Murmurs.  Abdomen Inspection:-Inspeection Normal. Palpation/Percussion:Note:No mass. Palpation and Percussion of the abdomen reveal- Non Tender, Non Distended + BS, no rebound or guarding.   Neurologic Cranial Nerve exam:- CN III-XII intact(No nystagmus), symmetric smile. Strength:- 5/5 equal and symmetric strength both upper and lower extremities.   Back-no CVA tenderness.    Assessment & Plan:   Patient Instructions    1. Tachycardia Intermittent tachycardia recently up to the 130 range when you are on steroids for neck pain.  Also description of remote event years ago when pulse went up to 190 on short walk.  Based on this and history of stroke I do think is best to refer you to cardiologist for probable Zio patch.  Will place that referral today. - EKG 12-Lead  2. Fatigue, unspecified type Placing future labs to get scheduled within the next week.  Please a schedule on the way out. - Comp Met (CMET); Future - B12; Future - Vitamin B1; Future - TSH; Future - Iron; Future - CBC w/Diff; Future  3. History of stroke No cause found years ago when you had a stroke.  Will need to evaluate cardiovascular risk factors/minimize those in the future going forward.  4. Hyperlipidemia, unspecified hyperlipidemia type Future fasting lipid panel placed today. - Lipid panel; Future - Comp Met (CMET); Future  5. Neck pain Can continue Tylenol.  Pain is not tolerable but still present.  If you want I can offer you referral to sports medicine as with duration of pain MRI may be indicated.  For allergic rhinitis/conjunctivitis continue Pataday and Astepro.  For rare occasional GERD continue omeprazole and healthy diet.  Your blood pressure is mildly elevated today.  Recommend that you get over-the-counter blood pressure monitor and check your blood pressure on occasion when relaxed at home.  Verify blood pressure tightly controlled.  Follow-up date to be determined after lab review.    Mackie Pai, PA-C   Time spent with patient today was 47  minutes which consisted of chart review,(2 ED/UC visit)discussing diagnoses, work up treatment and documentation.

## 2023-01-06 ENCOUNTER — Other Ambulatory Visit (INDEPENDENT_AMBULATORY_CARE_PROVIDER_SITE_OTHER): Payer: 59

## 2023-01-06 DIAGNOSIS — E785 Hyperlipidemia, unspecified: Secondary | ICD-10-CM

## 2023-01-06 DIAGNOSIS — R5383 Other fatigue: Secondary | ICD-10-CM | POA: Diagnosis not present

## 2023-01-06 LAB — COMPREHENSIVE METABOLIC PANEL
ALT: 22 U/L (ref 0–53)
AST: 24 U/L (ref 0–37)
Albumin: 4.3 g/dL (ref 3.5–5.2)
Alkaline Phosphatase: 56 U/L (ref 39–117)
BUN: 19 mg/dL (ref 6–23)
CO2: 27 mEq/L (ref 19–32)
Calcium: 9.1 mg/dL (ref 8.4–10.5)
Chloride: 107 mEq/L (ref 96–112)
Creatinine, Ser: 1.16 mg/dL (ref 0.40–1.50)
GFR: 66.89 mL/min (ref >60.00)
Glucose, Bld: 100 mg/dL — ABNORMAL HIGH (ref 70–99)
Potassium: 4.9 mEq/L (ref 3.5–5.1)
Sodium: 138 mEq/L (ref 135–145)
Total Bilirubin: 0.3 mg/dL (ref 0.2–1.2)
Total Protein: 6.9 g/dL (ref 6.0–8.3)

## 2023-01-06 LAB — CBC WITH DIFFERENTIAL/PLATELET
Basophils Absolute: 0.1 10*3/uL (ref 0.0–0.1)
Basophils Relative: 1.1 % (ref 0.0–3.0)
Eosinophils Absolute: 0.3 10*3/uL (ref 0.0–0.7)
Eosinophils Relative: 4.6 % (ref 0.0–5.0)
HCT: 35.9 % — ABNORMAL LOW (ref 39.0–52.0)
Hemoglobin: 11.6 g/dL — ABNORMAL LOW (ref 13.0–17.0)
Lymphocytes Relative: 29.4 % (ref 12.0–46.0)
Lymphs Abs: 1.7 10*3/uL (ref 0.7–4.0)
MCHC: 32.3 g/dL (ref 30.0–36.0)
MCV: 79.7 fl (ref 78.0–100.0)
Monocytes Absolute: 0.6 10*3/uL (ref 0.1–1.0)
Monocytes Relative: 10.8 % (ref 3.0–12.0)
Neutro Abs: 3.1 10*3/uL (ref 1.4–7.7)
Neutrophils Relative %: 54.1 % (ref 43.0–77.0)
Platelets: 277 10*3/uL (ref 150.0–400.0)
RBC: 4.5 Mil/uL (ref 4.22–5.81)
RDW: 16.1 % — ABNORMAL HIGH (ref 11.5–15.5)
WBC: 5.7 10*3/uL (ref 4.0–10.5)

## 2023-01-06 LAB — LIPID PANEL
Cholesterol: 238 mg/dL — ABNORMAL HIGH (ref 0–200)
HDL: 48.9 mg/dL (ref >39.00)
LDL Cholesterol: 164 mg/dL — ABNORMAL HIGH (ref 0–99)
NonHDL: 188.63
Total CHOL/HDL Ratio: 5
Triglycerides: 122 mg/dL (ref 0.0–149.0)
VLDL: 24.4 mg/dL (ref 0.0–40.0)

## 2023-01-06 LAB — VITAMIN B12: Vitamin B-12: 531 pg/mL (ref 211–911)

## 2023-01-06 LAB — TSH: TSH: 1.24 u[IU]/mL (ref 0.35–5.50)

## 2023-01-06 LAB — IRON: Iron: 20 ug/dL — ABNORMAL LOW (ref 42–165)

## 2023-01-07 MED ORDER — IRON (FERROUS SULFATE) 325 (65 FE) MG PO TABS: 325.0000 mg | ORAL_TABLET | Freq: Every day | ORAL | 1 refills | Status: DC

## 2023-01-07 MED ORDER — ATORVASTATIN CALCIUM 10 MG PO TABS: 10.0000 mg | ORAL_TABLET | Freq: Every day | ORAL | 3 refills | Status: DC

## 2023-01-07 NOTE — Addendum Note (Signed)
Addended by: Anabel Halon on: 01/07/2023 08:47 PM   Modules accepted: Orders

## 2023-01-09 LAB — VITAMIN B1: Vitamin B1 (Thiamine): 8 nmol/L (ref 8–30)

## 2023-01-20 ENCOUNTER — Ambulatory Visit (INDEPENDENT_AMBULATORY_CARE_PROVIDER_SITE_OTHER): Payer: 59 | Admitting: Medical

## 2023-01-20 VITALS — BP 128/80 | HR 79 | Resp 18 | Ht 69.0 in | Wt 193.0 lb

## 2023-01-20 DIAGNOSIS — D649 Anemia, unspecified: Secondary | ICD-10-CM

## 2023-01-20 DIAGNOSIS — R000 Tachycardia, unspecified: Secondary | ICD-10-CM

## 2023-01-20 DIAGNOSIS — R6882 Decreased libido: Secondary | ICD-10-CM

## 2023-01-20 DIAGNOSIS — R5383 Other fatigue: Secondary | ICD-10-CM

## 2023-01-20 DIAGNOSIS — Z125 Encounter for screening for malignant neoplasm of prostate: Secondary | ICD-10-CM

## 2023-01-20 DIAGNOSIS — E559 Vitamin D deficiency, unspecified: Secondary | ICD-10-CM

## 2023-01-20 NOTE — Progress Notes (Signed)
Subjective:    Patient ID: Tyler Obrien, male    DOB: Oct 23, 1960, 62 y.o.   MRN: [id number]  HPI Pt in for follow up.  Last visit AVS. " 1. Tachycardia Intermittent tachycardia recently up to the 130 range when you are on steroids for neck pain.  Also description of remote event years ago when pulse went up to 190 on short walk.  Based on this and history of stroke I do think is best to refer you to cardiologist for probable Zio patch.  Will place that referral today. - EKG 12-Lead   2. Fatigue, unspecified type Placing future labs to get scheduled within the next week.  Please a schedule on the way out. - Comp Met (CMET); Future - B12; Future - Vitamin B1; Future - TSH; Future - Iron; Future - CBC w/Diff; Future   3. History of stroke No cause found years ago when you had a stroke.  Will need to evaluate cardiovascular risk factors/minimize those in the future going forward.   4. Hyperlipidemia, unspecified hyperlipidemia type Future fasting lipid panel placed today. - Lipid panel; Future - Comp Met (CMET); Future   5. Neck pain Can continue Tylenol.  Pain is not tolerable but still present.  If you want I can offer you referral to sports medicine as with duration of pain MRI may be indicated.   For allergic rhinitis/conjunctivitis continue Pataday and Astepro."   Pt tells me that his heart rate seems to be settling down. Pt states yesterday on casual walk pulse rate was 114. Appt with cardiologsit in May.   3. Subtle exophytic lesion associated with the lesser curvature in the antral pre-pyloric region of the stomach concerning for neoplasm such as GI stromal tumor (GIST). Outpatient referral to GI for further evaluation and probable endoscopy and tissue sampling is strongly recommended in the near future to exclude underlying malignancy.    Pt has significant daily fatigue. He states severe and effecting daily activity. At time his iron was low states he did  donate blood. Also mild anemia at time of the labs last visit. Hx of bleeding ulcer in 1992. Pt states rare reflux. Negative colonsocopy and egd.   Other labs done for fatigue work up. Nothing was revealing.(Still fatigue but only slight better compared to before) Pt denies he has obvious snoring except he noticed woke up occasionally around time had kidney stones.  Fatigue enough that effects wok and effect ability to tolerate stress)   Pt up dates me that his neck pain and upper extremity symptoms are improved.  Some numbness to fingers no mostly. Pt declines referral to neurosurgeon presently. Pt not taking any meds for pain.       Review of Systems  Constitutional:  Positive for fatigue. Negative for chills and fever.  HENT:  Negative for congestion.   Respiratory:  Negative for cough, chest tightness and wheezing.   Cardiovascular:  Negative for chest pain and palpitations.  Gastrointestinal:  Negative for abdominal distention, constipation and nausea.  Genitourinary:  Negative for dysuria, flank pain and frequency.       Some low libido along with fatigue noted on discuss of diffential dx  Musculoskeletal:  Positive for neck pain. Negative for back pain and myalgias.       Much less recenlty.   Neurological:  Negative for dizziness, speech difficulty, weakness, numbness and headaches.  Hematological:  Negative for adenopathy. Does not bruise/bleed easily.  Psychiatric/Behavioral:  Negative for behavioral problems, decreased concentration  and dysphoric mood.     Past Medical History:  Diagnosis Date   Allergy    Arthritis    Bleeding gastric ulcer    Blood transfusion without reported diagnosis    CVA (cerebral infarction) 2011   Diverticulosis    GERD (gastroesophageal reflux disease)    prilosec as needed- upset stomach mostly - started after gastric bleeding ulcer   Kidney stone    Stroke 2011     Social History   Socioeconomic History   Marital status: Married     Spouse name: Not on file   Number of children: Not on file   Years of education: Not on file   Highest education level: Bachelor's degree (e.g., BA, AB, BS)  Occupational History   Not on file  Tobacco Use   Smoking status: Never   Smokeless tobacco: Former  Building services engineer Use: Never used  Substance and Sexual Activity   Alcohol use: Not Currently   Drug use: No   Sexual activity: Not on file  Other Topics Concern   Not on file  Social History Narrative   Not on file   Social Determinants of Health   Financial Resource Strain: Low Risk  (01/20/2023)   Overall Financial Resource Strain (CARDIA)    Difficulty of Paying Living Expenses: Not hard at all  Food Insecurity: Not on file  Transportation Needs: No Transportation Needs (01/20/2023)   PRAPARE - Administrator, Civil Service (Medical): No    Lack of Transportation (Non-Medical): No  Physical Activity: Insufficiently Active (01/20/2023)   Exercise Vital Sign    Days of Exercise per Week: 1 day    Minutes of Exercise per Session: 20 min  Stress: Stress Concern Present (01/20/2023)   [employer] of Occupational Health - Occupational Stress Questionnaire    Feeling of Stress : To some extent  Social Connections: Socially Integrated (01/20/2023)   Social Connection and Isolation Panel [NHANES]    Frequency of Communication with Friends and Family: More than three times a week    Frequency of Social Gatherings with Friends and Family: Twice a week    Attends Religious Services: More than 4 times per year    Active Member of [employer] or Organizations: Yes    Attends Engineer, structural: More than 4 times per year    Marital Status: Married  Catering manager Violence: Not on file    Past Surgical History:  Procedure Laterality Date   APPENDECTOMY     COLONOSCOPY     10 yrs ago    cyst removal from scrotum     cyst removed from elbow     CYSTOSCOPY/URETEROSCOPY/HOLMIUM LASER/STENT  PLACEMENT Left 11/20/2022   Procedure: LEFT URETEROSCOPY/HOLMIUM LASER/STENT PLACEMENT;  Surgeon: Despina Arias, MD;  Location: [facility];  Service: Urology;  Laterality: Left;   FINGER FRACTURE SURGERY Right    5th finger   KIDNEY STONE SURGERY     NASAL SINUS SURGERY  08/2009   OPEN ANTERIOR SHOULDER RECONSTRUCTION     UPPER GASTROINTESTINAL ENDOSCOPY     10 yrs ago    WISDOM TOOTH EXTRACTION      Family History  Problem Relation Age of Onset   Cancer Father        Lung CA   Hyperlipidemia Father    Hypertension Father    Lung cancer Father    Cancer Mother    Endometrial cancer Mother    Stomach cancer Maternal Grandfather  Colon cancer Neg Hx    Colon polyps Neg Hx    Esophageal cancer Neg Hx    Rectal cancer Neg Hx     Allergies  Allergen Reactions   Nsaids Other (See Comments)    ULCER    Current Outpatient Medications on File Prior to Visit  Medication Sig Dispense Refill   Ascorbic Acid (VITAMIN C) 100 MG tablet Take 100 mg by mouth daily.     aspirin EC 81 MG tablet Take 81 mg by mouth daily.     atorvastatin (LIPITOR) 10 MG tablet Take 1 tablet (10 mg total) by mouth daily. 30 tablet 3   azelastine (ASTELIN) 0.1 % nasal spray Place 2 sprays into both nostrils 2 (two) times daily as needed for allergies or rhinitis.     b complex vitamins tablet Take 1 tablet by mouth daily.     carisoprodol (SOMA) 350 MG tablet Take 1 tablet (350 mg total) by mouth 3 (three) times daily. 15 tablet 0   Cholecalciferol (VITAMIN D3) 75 MCG (3000 UT) TABS Take 3,000 Units by mouth daily.     Coenzyme Q10 (COQ-10 PO) Take 1 capsule by mouth daily.     guaiFENesin (MUCINEX) 600 MG 12 hr tablet Take 600 mg by mouth 2 (two) times daily as needed for cough or to loosen phlegm.     hyoscyamine (LEVSIN) 0.125 MG tablet Take 1 tablet (0.125 mg total) by mouth every 4 (four) hours as needed. 30 tablet 0   Iron, Ferrous Sulfate, 325 (65 Fe) MG TABS Take 325 mg by mouth daily. 30 tablet 1    Multiple Vitamin (MULTIVITAMIN) tablet Take 1 tablet by mouth daily.     Omega-3 Fatty Acids (OMEGA 3 PO) Take 1 capsule by mouth daily.     omeprazole (PRILOSEC) 20 MG capsule Take 20 mg by mouth daily as needed (acid reflux).     phenazopyridine (PYRIDIUM) 200 MG tablet Take 1 tablet (200 mg total) by mouth 3 (three) times daily as needed (dysuria). 20 tablet 1   sulfamethoxazole-trimethoprim (BACTRIM DS) 800-160 MG tablet Take 1 tablet by mouth 2 (two) times daily. 10 tablet 0   tamsulosin (FLOMAX) 0.4 MG CAPS capsule Take 1 capsule (0.4 mg total) by mouth daily after supper. 20 capsule 0   No current facility-administered medications on file prior to visit.    BP 128/80   Pulse 79   Resp 18   Ht  (1.753 m)   Wt 193 lb (87.5 kg)   SpO2 99%   BMI 28.50 kg/m         Objective:   Physical Exam  General Mental Status- Alert. General Appearance- Not in acute distress.   Skin General: Color- Normal Color. Moisture- Normal Moisture.  Neck Carotid Arteries- Normal color. Moisture- Normal Moisture. No carotid bruits. No JVD.  Chest and Lung Exam Auscultation: Breath Sounds:-Normal.  Cardiovascular Auscultation:Rythm- Regular. Murmurs & Other Heart Sounds:Auscultation of the heart reveals- No Murmurs.  Abdomen Inspection:-Inspeection Normal. Palpation/Percussion:Note:No mass. Palpation and Percussion of the abdomen reveal- Non Tender, Non Distended + BS, no rebound or guarding.   Neurologic Cranial Nerve exam:- CN III-XII intact(No nystagmus), symmetric smile. Strength:- 5/5 equal and symmetric strength both upper and lower extremities.       Assessment & Plan:   Patient Instructions  1. Fatigue, unspecified type and low vit d in past per chart Continue iron and check labs along with vit d level. - CBC w/Diff - Iron, TIBC and Ferritin  Panel - VITAMIN D 25 Hydroxy (Vit-D Deficiency, Fractures)  Discussed sleep apnea in differntial. Have wife check to see you  snore.  2. Anemia, unspecified type Follow hb/hct levels. Would not expect gi bleed but may go ahead and advise turn in stool cards if values indicate need - Fecal occult blood, imunochemical([facility]); Future  3. Vitamin D deficiency  Advise on level and may rx vit d or advise otc vit D.  4. For tachycardia intermittent keep appt with cardiologist. If signs and sympptoms during interim let me know.  Neck pain with arm numbness symptoms much better. But if worse symptoms let me know and can refer to neurosurgeon  Labs future this week but ask to be scheduled as 8 am or sooner if lab appt.  Follow up date to be determined after lab review.     Time spent with patient today was  43 minutes which consisted of chart review, discussing diagnosis, differential dx, work up treatment and documentation.

## 2023-01-20 NOTE — Patient Instructions (Addendum)
1. Fatigue, unspecified type and low vit d in past per chart Continue iron and check labs along with vit d level. - CBC w/Diff - Iron, TIBC and Ferritin Panel - VITAMIN D 25 Hydroxy (Vit-D Deficiency, Fractures)  Discussed sleep apnea in differntial. Have wife check to see you snore.  2. Anemia, unspecified type Follow hb/hct levels. Would not expect gi bleed but may go ahead and advise turn in stool cards if values indicate need - Fecal occult blood, imunochemical([facility]); Future  3. Vitamin D deficiency  Advise on level and may rx vit d or advise otc vit D.  4. For tachycardia intermittent keep appt with cardiologist. If signs and sympptoms during interim let me know.  Neck pain with arm numbness symptoms much better. But if worse symptoms let me know and can refer to neurosurgeon  Decided to include testosterone panel as this may be in differential for fatigue.  Labs future this week but ask to be scheduled as 8 am or sooner if lab appt.  Follow up date to be determined after lab review.

## 2023-01-21 ENCOUNTER — Other Ambulatory Visit (INDEPENDENT_AMBULATORY_CARE_PROVIDER_SITE_OTHER): Payer: 59

## 2023-01-21 DIAGNOSIS — R5383 Other fatigue: Secondary | ICD-10-CM | POA: Diagnosis not present

## 2023-01-21 DIAGNOSIS — D649 Anemia, unspecified: Secondary | ICD-10-CM | POA: Diagnosis not present

## 2023-01-21 DIAGNOSIS — E559 Vitamin D deficiency, unspecified: Secondary | ICD-10-CM | POA: Diagnosis not present

## 2023-01-21 DIAGNOSIS — Z125 Encounter for screening for malignant neoplasm of prostate: Secondary | ICD-10-CM | POA: Diagnosis not present

## 2023-01-21 DIAGNOSIS — R6882 Decreased libido: Secondary | ICD-10-CM

## 2023-01-21 LAB — CBC WITH DIFFERENTIAL/PLATELET
Basophils Absolute: 0.1 10*3/uL (ref 0.0–0.1)
Basophils Relative: 0.8 % (ref 0.0–3.0)
Eosinophils Absolute: 0.4 10*3/uL (ref 0.0–0.7)
Eosinophils Relative: 5.2 % — ABNORMAL HIGH (ref 0.0–5.0)
HCT: 38.2 % — ABNORMAL LOW (ref 39.0–52.0)
Hemoglobin: 12.3 g/dL — ABNORMAL LOW (ref 13.0–17.0)
Lymphocytes Relative: 29.3 % (ref 12.0–46.0)
Lymphs Abs: 2 10*3/uL (ref 0.7–4.0)
MCHC: 32.2 g/dL (ref 30.0–36.0)
MCV: 79.5 fl (ref 78.0–100.0)
Monocytes Absolute: 0.6 10*3/uL (ref 0.1–1.0)
Monocytes Relative: 9 % (ref 3.0–12.0)
Neutro Abs: 3.9 10*3/uL (ref 1.4–7.7)
Neutrophils Relative %: 55.7 % (ref 43.0–77.0)
Platelets: 250 10*3/uL (ref 150.0–400.0)
RBC: 4.8 Mil/uL (ref 4.22–5.81)
RDW: 17 % — ABNORMAL HIGH (ref 11.5–15.5)
WBC: 7 10*3/uL (ref 4.0–10.5)

## 2023-01-21 LAB — PSA: PSA: 0.7 ng/mL (ref 0.10–4.00)

## 2023-01-21 LAB — VITAMIN D 25 HYDROXY (VIT D DEFICIENCY, FRACTURES): VITD: 55.99 ng/mL (ref 30.00–100.00)

## 2023-01-22 LAB — IRON,TIBC AND FERRITIN PANEL
%SAT: 8 % (calc) — ABNORMAL LOW (ref 20–48)
Ferritin: 16 ng/mL — ABNORMAL LOW (ref 24–380)
Iron: 31 ug/dL — ABNORMAL LOW (ref 50–180)
TIBC: 377 mcg/dL (calc) (ref 250–425)

## 2023-01-22 LAB — TESTOSTERONE TOTAL,FREE,BIO, MALES
Albumin: 4.2 g/dL (ref 3.6–5.1)
Sex Hormone Binding: 51 nmol/L (ref 22–77)
Testosterone, Bioavailable: 137 ng/dL (ref 110.0–575.0)
Testosterone, Free: 71.1 pg/mL (ref 46.0–224.0)
Testosterone: 719 ng/dL (ref 250–827)

## 2023-01-22 MED ORDER — IRON (FERROUS SULFATE) 325 (65 FE) MG PO TABS: ORAL_TABLET | ORAL | 0 refills | Status: DC

## 2023-01-22 NOTE — Addendum Note (Signed)
Addended by: Gwenevere Abbot on: 01/22/2023 05:50 PM   Modules accepted: Orders

## 2023-01-27 ENCOUNTER — Telehealth: Payer: Self-pay | Admitting: Medical

## 2023-01-27 NOTE — Telephone Encounter (Signed)
Patient states he mess up his stool test and will come to pick up a new one soon. Please advise.

## 2023-01-29 ENCOUNTER — Other Ambulatory Visit (INDEPENDENT_AMBULATORY_CARE_PROVIDER_SITE_OTHER): Payer: 59

## 2023-01-29 DIAGNOSIS — R5383 Other fatigue: Secondary | ICD-10-CM | POA: Diagnosis not present

## 2023-01-31 LAB — FECAL OCCULT BLOOD, IMMUNOCHEMICAL: Fecal Occult Bld: NEGATIVE

## 2023-02-12 ENCOUNTER — Ambulatory Visit: Payer: 59 | Admitting: Medical

## 2023-02-12 VITALS — BP 134/90 | HR 84 | Temp 98.0°F | Resp 18 | Ht 69.0 in | Wt 194.4 lb

## 2023-02-12 DIAGNOSIS — R000 Tachycardia, unspecified: Secondary | ICD-10-CM

## 2023-02-12 DIAGNOSIS — M542 Cervicalgia: Secondary | ICD-10-CM | POA: Diagnosis not present

## 2023-02-12 DIAGNOSIS — D649 Anemia, unspecified: Secondary | ICD-10-CM

## 2023-02-12 DIAGNOSIS — M792 Neuralgia and neuritis, unspecified: Secondary | ICD-10-CM | POA: Diagnosis not present

## 2023-02-12 LAB — CBC WITH DIFFERENTIAL/PLATELET
Basophils Absolute: 0.1 10*3/uL (ref 0.0–0.1)
Basophils Relative: 0.9 % (ref 0.0–3.0)
Eosinophils Absolute: 0.3 10*3/uL (ref 0.0–0.7)
Eosinophils Relative: 5.5 % — ABNORMAL HIGH (ref 0.0–5.0)
HCT: 43.4 % (ref 39.0–52.0)
Hemoglobin: 14.1 g/dL (ref 13.0–17.0)
Lymphocytes Relative: 32.1 % (ref 12.0–46.0)
Lymphs Abs: 2 10*3/uL (ref 0.7–4.0)
MCHC: 32.6 g/dL (ref 30.0–36.0)
MCV: 81.1 fl (ref 78.0–100.0)
Monocytes Absolute: 0.5 10*3/uL (ref 0.1–1.0)
Monocytes Relative: 8.7 % (ref 3.0–12.0)
Neutro Abs: 3.3 10*3/uL (ref 1.4–7.7)
Neutrophils Relative %: 52.8 % (ref 43.0–77.0)
Platelets: 198 10*3/uL (ref 150.0–400.0)
RBC: 5.35 Mil/uL (ref 4.22–5.81)
RDW: 18.6 % — ABNORMAL HIGH (ref 11.5–15.5)
WBC: 6.2 10*3/uL (ref 4.0–10.5)

## 2023-02-12 MED ORDER — GABAPENTIN 100 MG PO CAPS: 100.0000 mg | ORAL_CAPSULE | Freq: Three times a day (TID) | ORAL | 0 refills | Status: DC

## 2023-02-12 NOTE — Progress Notes (Signed)
Subjective:    Patient ID: Tyler Obrien, male    DOB: 10/07/1961, 62 y.o.   MRN: [id number]  HPI Pt in for follow up.   Pt has mild anemia and mild low iron. His stool test was negative for blood.  I had rx'd iron and recently advised to increase.   Pt states he gets fatigued with minimal activity. He states most he can walk is 1 mile. He use to walk 3 miles. He states formerly could walk longer distance and more extreme circumstance could walk longer. Pt tried to inrease iron to twice daily and had constipation followed by loose stools. He describes some difficulty even with one tab.   Pt has upcoming appointment with cardiologist on Monday or Tuesday.  Pt has appointment next week with Dr. Dewayne Shorter. Short walks pulse will get to 120 range quickly per pt.   Neck pain(last hpi) Can continue Tylenol.  Pain is not tolerable but still present.  If you want I can offer you referral to sports medicine as with duration of pain MRI may be indicated.  Pain is still present in neck and radiating down to left upper extremity. Also left shoulder surgery following dislocations. Some numbness sensation to hands on and off.       Review of Systems  Constitutional:  Negative for chills, fatigue and fever.  HENT:  Negative for congestion and ear pain.   Respiratory:  Negative for cough, chest tightness, wheezing and stridor.   Cardiovascular:  Negative for chest pain and palpitations.  Gastrointestinal:  Negative for abdominal pain, anal bleeding and constipation.  Musculoskeletal:  Negative for back pain.  Neurological:  Negative for dizziness, seizures, weakness and light-headedness.       Radicular pain and neck pain.  Hematological:  Negative for adenopathy. Does not bruise/bleed easily.  Psychiatric/Behavioral:  Negative for confusion.    Past Medical History:  Diagnosis Date   Allergy    Arthritis    Bleeding gastric ulcer    Blood transfusion without reported diagnosis     CVA (cerebral infarction) 2011   Diverticulosis    GERD (gastroesophageal reflux disease)    prilosec as needed- upset stomach mostly - started after gastric bleeding ulcer   Kidney stone    Stroke [facility]) 2011     Social History   Socioeconomic History   Marital status: Married    Spouse name: Not on file   Number of children: Not on file   Years of education: Not on file   Highest education level: Bachelor's degree (e.g., BA, AB, BS)  Occupational History   Not on file  Tobacco Use   Smoking status: Never   Smokeless tobacco: Former  Building services engineer Use: Never used  Substance and Sexual Activity   Alcohol use: Not Currently   Drug use: No   Sexual activity: Not on file  Other Topics Concern   Not on file  Social History Narrative   Not on file   Social Determinants of Health   Financial Resource Strain: Low Risk  (01/20/2023)   Overall Financial Resource Strain (CARDIA)    Difficulty of Paying Living Expenses: Not hard at all  Food Insecurity: Not on file  Transportation Needs: No Transportation Needs (01/20/2023)   PRAPARE - Administrator, Civil Service (Medical): No    Lack of Transportation (Non-Medical): No  Physical Activity: Insufficiently Active (01/20/2023)   Exercise Vital Sign    Days of Exercise per Week:  1 day    Minutes of Exercise per Session: 20 min  Stress: Stress Concern Present (01/20/2023)   [employer] of Occupational Health - Occupational Stress Questionnaire    Feeling of Stress : To some extent  Social Connections: Socially Integrated (01/20/2023)   Social Connection and Isolation Panel [NHANES]    Frequency of Communication with Friends and Family: More than three times a week    Frequency of Social Gatherings with Friends and Family: Twice a week    Attends Religious Services: More than 4 times per year    Active Member of [employer] or Organizations: Yes    Attends Engineer, structural: More than 4 times per year     Marital Status: Married  Catering manager Violence: Not on file    Past Surgical History:  Procedure Laterality Date   APPENDECTOMY     COLONOSCOPY     10 yrs ago    cyst removal from scrotum     cyst removed from elbow     CYSTOSCOPY/URETEROSCOPY/HOLMIUM LASER/STENT PLACEMENT Left 11/20/2022   Procedure: LEFT URETEROSCOPY/HOLMIUM LASER/STENT PLACEMENT;  Surgeon: Despina Arias, MD;  Location: [facility];  Service: Urology;  Laterality: Left;   FINGER FRACTURE SURGERY Right    5th finger   KIDNEY STONE SURGERY     NASAL SINUS SURGERY  08/2009   OPEN ANTERIOR SHOULDER RECONSTRUCTION     UPPER GASTROINTESTINAL ENDOSCOPY     10 yrs ago    WISDOM TOOTH EXTRACTION      Family History  Problem Relation Age of Onset   Cancer Father        Lung CA   Hyperlipidemia Father    Hypertension Father    Lung cancer Father    Cancer Mother    Endometrial cancer Mother    Stomach cancer Maternal Grandfather    Colon cancer Neg Hx    Colon polyps Neg Hx    Esophageal cancer Neg Hx    Rectal cancer Neg Hx     Allergies  Allergen Reactions   Nsaids Other (See Comments)    ULCER   Lidocaine-Menthol Rash    Current Outpatient Medications on File Prior to Visit  Medication Sig Dispense Refill   Ascorbic Acid (VITAMIN C) 100 MG tablet Take 100 mg by mouth daily.     aspirin EC 81 MG tablet Take 81 mg by mouth daily.     atorvastatin (LIPITOR) 10 MG tablet Take 1 tablet (10 mg total) by mouth daily. 30 tablet 3   azelastine (ASTELIN) 0.1 % nasal spray Place 2 sprays into both nostrils 2 (two) times daily as needed for allergies or rhinitis.     b complex vitamins tablet Take 1 tablet by mouth daily.     Cholecalciferol (VITAMIN D3) 75 MCG (3000 UT) TABS Take 3,000 Units by mouth daily.     Coenzyme Q10 (COQ-10 PO) Take 1 capsule by mouth daily.     guaiFENesin (MUCINEX) 600 MG 12 hr tablet Take 600 mg by mouth 2 (two) times daily as needed for cough or to loosen phlegm.     Iron, Ferrous  Sulfate, 325 (65 Fe) MG TABS 1 tab po bid 60 tablet 0   Omega-3 Fatty Acids (OMEGA 3 PO) Take 1 capsule by mouth daily.     omeprazole (PRILOSEC) 20 MG capsule Take 20 mg by mouth daily as needed (acid reflux).     sulfamethoxazole-trimethoprim (BACTRIM DS) 800-160 MG tablet Take 1 tablet by mouth 2 (two)  times daily. 10 tablet 0   tamsulosin (FLOMAX) 0.4 MG CAPS capsule Take 1 capsule (0.4 mg total) by mouth daily after supper. 20 capsule 0   No current facility-administered medications on file prior to visit.    BP (!) 134/90   Pulse 84   Temp 98 F (36.7 C)   Resp 18   Ht 5\' 9"  (1.753 m)   Wt 194 lb 6.4 oz (88.2 kg)   SpO2 95%   BMI 28.71 kg/m        Objective:   Physical Exam   General Mental Status- Alert. General Appearance- Not in acute distress.   Skin General: Color- Normal Color. Moisture- Normal Moisture.  Neck Carotid Arteries- Normal color. Moisture- Normal Moisture. No carotid bruits. No JVD.  Chest and Lung Exam Auscultation: Breath Sounds:-Normal.  Cardiovascular Auscultation:Rythm- Regular. Murmurs & Other Heart Sounds:Auscultation of the heart reveals- No Murmurs.  Abdomen Inspection:-Inspeection Normal. Palpation/Percussion:Note:No mass. Palpation and Percussion of the abdomen reveal- Non Tender, Non Distended + BS, no rebound or guarding.   Neurologic Cranial Nerve exam:- CN III-XII intact(No nystagmus), symmetric smile. Strength:- 5/5 equal and symmetric strength both upper and lower extremities.      Assessment & Plan:   Patient Instructions  1. Anemia, unspecified type Continue iron one tab daily since can't tolerate twice daily.  - CBC w/Diff - Iron, TIBC and Ferritin Panel  2. Neck pain You will self refer to neurologist. If having issues making appointment let me know. Rx gabapentin. Rx advisement given  3. Radicular pain in left arm Rx gabapentin  4. Tachycardia  Normal at rest today. Referral to cardiologist pending. Next  week.  Follow up 1 month or sooner if needed   [employer], PA-C

## 2023-02-12 NOTE — Patient Instructions (Addendum)
1. Anemia, unspecified type Continue iron one tab daily since can't tolerate twice daily.  - CBC w/Diff - Iron, TIBC and Ferritin Panel  2. Neck pain You will self refer to neurologist. If having issues making appointment let me know. Rx gabapentin. Rx advisement given  3. Radicular pain in left arm Rx gabapentin  4. Tachycardia  Normal at rest today. Referral to cardiologist pending. Next week.  Reminder if gi does not call you on repeat ct abdomen by one week then call them.   Follow up 1 month or sooner if needed

## 2023-02-13 LAB — IRON,TIBC AND FERRITIN PANEL
%SAT: 57 % (calc) — ABNORMAL HIGH (ref 20–48)
Ferritin: 32 ng/mL (ref 24–380)
Iron: 202 ug/dL — ABNORMAL HIGH (ref 50–180)
TIBC: 352 mcg/dL (calc) (ref 250–425)

## 2023-02-17 ENCOUNTER — Ambulatory Visit: Payer: 59 | Attending: Cardiology

## 2023-02-17 ENCOUNTER — Encounter: Payer: Self-pay | Admitting: Cardiology

## 2023-02-17 ENCOUNTER — Ambulatory Visit: Payer: 59 | Attending: Cardiology | Admitting: Cardiology

## 2023-02-17 VITALS — BP 138/96 | HR 91 | Ht 69.0 in | Wt 194.0 lb

## 2023-02-17 DIAGNOSIS — I6930 Unspecified sequelae of cerebral infarction: Secondary | ICD-10-CM

## 2023-02-17 DIAGNOSIS — R0609 Other forms of dyspnea: Secondary | ICD-10-CM

## 2023-02-17 DIAGNOSIS — R002 Palpitations: Secondary | ICD-10-CM | POA: Diagnosis not present

## 2023-02-17 DIAGNOSIS — E785 Hyperlipidemia, unspecified: Secondary | ICD-10-CM | POA: Diagnosis not present

## 2023-02-17 DIAGNOSIS — R0789 Other chest pain: Secondary | ICD-10-CM

## 2023-02-17 MED ORDER — METOPROLOL TARTRATE 100 MG PO TABS: 100.0000 mg | ORAL_TABLET | Freq: Once | ORAL | 0 refills | Status: DC

## 2023-02-17 NOTE — Patient Instructions (Signed)
Medication Instructions:  Your physician recommends that you continue on your current medications as directed. Please refer to the Current Medication list given to you today.  *If you need a refill on your cardiac medications before your next appointment, please call your pharmacy*   Lab Work: Your physician recommends that you return for lab work in:   Labs 1 week before CT: BMP  If you have labs (blood work) drawn today and your tests are completely normal, you will receive your results only by: MyChart Message (if you have MyChart) OR A paper copy in the mail If you have any lab test that is abnormal or we need to change your treatment, we will call you to review the results.   Testing/Procedures: Your physician has requested that you have an echocardiogram. Echocardiography is a painless test that uses sound waves to create images of your heart. It provides your doctor with information about the size and shape of your heart and how well your heart's chambers and valves are working. This procedure takes approximately one hour. There are no restrictions for this procedure. Please do NOT wear cologne, perfume, aftershave, or lotions (deodorant is allowed). Please arrive 15 minutes prior to your appointment time.  A zio monitor was ordered today. It will remain on for 14 days. You will then return monitor and event diary in provided box. It takes 1-2 weeks for report to be downloaded and returned to [place]. We will call you with the results. If monitor falls off or has orange flashing light, please call Zio for further instructions.     Your cardiac CT will be scheduled at one of the below locations:   [facility] [address] [place], [place] [address] [phone]  OR  [facility] [address] [place], [place] [address] [phone]  OR   [facility] [address] [place], [place]  [address] [phone]  If scheduled at [facility], please arrive at the [facility] Entrance (Entrance C2) of [facility] 30 minutes prior to test start time. You can use the FREE valet parking offered at entrance C (encouraged to control the heart rate for the test)  Proceed to the [facility] Radiology Department (first floor) to check-in and test prep.  All radiology patients and guests should use entrance C2 at [facility], accessed from [facility], even though the hospital's physical address listed is [address].    If scheduled at [facility] or [facility], please arrive 15 mins early for check-in and test prep.   Please follow these instructions carefully (unless otherwise directed):  On the Night Before the Test: Be sure to Drink plenty of water. Do not consume any caffeinated/decaffeinated beverages or chocolate 12 hours prior to your test. Do not take any antihistamines 12 hours prior to your test.  On the Day of the Test: Drink plenty of water until 1 hour prior to the test. Do not eat any food 1 hour prior to test. You may take your regular medications prior to the test.  Take metoprolol (Lopressor) two hours prior to test.      After the Test: Drink plenty of water. After receiving IV contrast, you may experience a mild flushed feeling. This is normal. On occasion, you may experience a mild rash up to 24 hours after the test. This is not dangerous. If this occurs, you  can take Benadryl 25 mg and increase your fluid intake. If you experience trouble breathing, this can be serious. If it is severe call 911 IMMEDIATELY. If it is mild, please call our office. If you take any of these medications: Glipizide/Metformin, Avandament, Glucavance, please do not take 48 hours after completing test unless otherwise instructed.  We will call to schedule your test 2-4 weeks out  understanding that some insurance companies will need an authorization prior to the service being performed.   For non-scheduling related questions, please contact the cardiac imaging nurse navigator should you have any questions/concerns: Rockwell Alexandria, Cardiac Imaging Nurse Navigator Larey Brick, Cardiac Imaging Nurse Navigator [facility] Direct Office Dial: [phone]   For scheduling needs, including cancellations and rescheduling, please call [place], [phone].    Follow-Up: At [facility], you and your health needs are our priority.  As part of our continuing mission to provide you with exceptional heart care, we have created designated Provider Care Teams.  These Care Teams include your primary Cardiologist (physician) and Advanced Practice Providers (APPs -  Physician Assistants and Nurse Practitioners) who all work together to provide you with the care you need, when you need it.  We recommend signing up for the patient portal called "MyChart".  Sign up information is provided on this After Visit Summary.  MyChart is used to connect with patients for Virtual Visits (Telemedicine).  Patients are able to view lab/test results, encounter notes, upcoming appointments, etc.  Non-urgent messages can be sent to your provider as well.   To learn more about what you can do with MyChart, go to [email or web address].    Your next appointment:   2 month(s)  Provider:   Gypsy Balsam, MD    Other Instructions Patient declined EKG chaperone

## 2023-02-17 NOTE — Progress Notes (Unsigned)
Cardiology Consultation:    Date:  02/17/2023   ID:  Tyler Obrien, DOB December 12, 1960, MRN [id number]  PCP:  Esperanza Richters, PA-C  Cardiologist:  Gypsy Balsam, MD   Referring MD: Marisue Brooklyn   Chief Complaint  Patient presents with   Tachycardia   Fatigue    Sob     Shortness of Breath         History of Present Illness:    Tyler Obrien is a 62 y.o. male who is being seen today for the evaluation of fast heartbeats at the request of [name], Ramon Dred[place], [place].  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  Quite extensive workup has been done at that time which was unrevealing.  He has been doing well since that time however within the last few months he had a few issues first of all his Apple Watch alarmed him many times that his heart rate speeds up.  Also very minimal effort will bring his heart rate up.  He described the fact that something is not right in his upper chest when he walks and exercise sometimes he feels uneasy sensation in his chest when he walks.  He does not exercise on the regular basis.  He does not smoke, never did.  He does have family history of coronary artery disease some premature however his father apparently was a heavy smoker.  He is not on any special diet.  He was given prescription for Lipitor however never took it.  He is LDL is 164 this is from April 1 of this year.  Past Medical History:  Diagnosis Date   Allergy    Arthritis    Bleeding gastric ulcer    Blood transfusion without reported diagnosis    CVA (cerebral infarction) 2011   Diverticulosis    GERD (gastroesophageal reflux disease)    prilosec as needed- upset stomach mostly - started after gastric bleeding ulcer   Kidney stone    Stroke [facility]) 2011    Past Surgical History:  Procedure Laterality Date   APPENDECTOMY     COLONOSCOPY     10 yrs ago    cyst removal from scrotum     cyst removed  from elbow     CYSTOSCOPY/URETEROSCOPY/HOLMIUM LASER/STENT PLACEMENT Left 11/20/2022   Procedure: LEFT URETEROSCOPY/HOLMIUM LASER/STENT PLACEMENT;  Surgeon: Despina Arias, MD;  Location: [facility];  Service: Urology;  Laterality: Left;   FINGER FRACTURE SURGERY Right    5th finger   KIDNEY STONE SURGERY     NASAL SINUS SURGERY  08/2009   OPEN ANTERIOR SHOULDER RECONSTRUCTION     UPPER GASTROINTESTINAL ENDOSCOPY     10 yrs ago    WISDOM TOOTH EXTRACTION      Current Medications: Current Meds  Medication Sig   Ascorbic Acid (VITAMIN C) 100 MG tablet Take 100 mg by mouth daily.   aspirin EC 81 MG tablet Take 81 mg by mouth daily.   azelastine (ASTELIN) 0.1 % nasal spray Place 2 sprays into both nostrils 2 (two) times daily as needed for allergies or rhinitis.   b complex vitamins tablet Take 1 tablet by mouth daily.   Cholecalciferol (VITAMIN D3) 75 MCG (3000 UT) TABS Take 3,000 Units by mouth daily.   Coenzyme Q10 (COQ-10 PO) Take 1 capsule by mouth daily.   gabapentin (NEURONTIN) 100 MG capsule Take 1 capsule (100 mg total) by mouth 3 (  three) times daily.   guaiFENesin (MUCINEX) 600 MG 12 hr tablet Take 600 mg by mouth 2 (two) times daily as needed for cough or to loosen phlegm.   Iron, Ferrous Sulfate, 325 (65 Fe) MG TABS 1 tab po bid (Patient taking differently: Take 1 tablet by mouth in the morning and at bedtime. 1 tab po bid)   Omega-3 Fatty Acids (OMEGA 3 PO) Take 1 capsule by mouth daily.   omeprazole (PRILOSEC) 20 MG capsule Take 20 mg by mouth daily as needed (acid reflux).   [DISCONTINUED] atorvastatin (LIPITOR) 10 MG tablet Take 1 tablet (10 mg total) by mouth daily.   [DISCONTINUED] sulfamethoxazole-trimethoprim (BACTRIM DS) 800-160 MG tablet Take 1 tablet by mouth 2 (two) times daily.   [DISCONTINUED] tamsulosin (FLOMAX) 0.4 MG CAPS capsule Take 1 capsule (0.4 mg total) by mouth daily after supper.     Allergies:   Nsaids and Lidocaine-menthol   Social History    Socioeconomic History   Marital status: Married    Spouse name: Not on file   Number of children: Not on file   Years of education: Not on file   Highest education level: Bachelor's degree (e.g., BA, AB, BS)  Occupational History   Not on file  Tobacco Use   Smoking status: Never   Smokeless tobacco: Former  Building services engineer Use: Never used  Substance and Sexual Activity   Alcohol use: Not Currently   Drug use: No   Sexual activity: Not on file  Other Topics Concern   Not on file  Social History Narrative   Not on file   Social Determinants of Health   Financial Resource Strain: Low Risk  (01/20/2023)   Overall Financial Resource Strain (CARDIA)    Difficulty of Paying Living Expenses: Not hard at all  Food Insecurity: Not on file  Transportation Needs: No Transportation Needs (01/20/2023)   PRAPARE - Administrator, Civil Service (Medical): No    Lack of Transportation (Non-Medical): No  Physical Activity: Insufficiently Active (01/20/2023)   Exercise Vital Sign    Days of Exercise per Week: 1 day    Minutes of Exercise per Session: 20 min  Stress: Stress Concern Present (01/20/2023)   [employer] of Occupational Health - Occupational Stress Questionnaire    Feeling of Stress : To some extent  Social Connections: Socially Integrated (01/20/2023)   Social Connection and Isolation Panel [NHANES]    Frequency of Communication with Friends and Family: More than three times a week    Frequency of Social Gatherings with Friends and Family: Twice a week    Attends Religious Services: More than 4 times per year    Active Member of [employer] or Organizations: Yes    Attends Engineer, structural: More than 4 times per year    Marital Status: Married     Family History: The patient's family history includes Cancer in his father and mother; Endometrial cancer in his mother; Hyperlipidemia in his father; Hypertension in his father; Lung cancer in his  father; Stomach cancer in his maternal grandfather. There is no history of Colon cancer, Colon polyps, Esophageal cancer, or Rectal cancer. ROS:   Please see the history of present illness.    All 14 point review of systems negative except as described per history of present illness.  EKGs/Labs/Other Studies Reviewed:    The following studies were reviewed today:   EKG:  EKG is  ordered today.  The ekg ordered today  demonstrates normal sinus rhythm, normal P interval, normal QS complex duration fulgent no ST segment changes  Recent Labs: 01/06/2023: ALT 22; BUN 19; Creatinine, Ser 1.16; Potassium 4.9; Sodium 138; TSH 1.24 02/12/2023: Hemoglobin 14.1; Platelets 198.0  Recent Lipid Panel    Component Value Date/Time   CHOL 238 (H) 01/06/2023 0749   TRIG 122.0 01/06/2023 0749   HDL 48.90 01/06/2023 0749   CHOLHDL 5 01/06/2023 0749   VLDL 24.4 01/06/2023 0749   LDLCALC 164 (H) 01/06/2023 0749   LDLCALC 193 (H) 09/10/2018 0844    Physical Exam:    VS:  BP (!) 138/96 (BP Location: Left Arm, Patient Position: Sitting)   Pulse 91   Ht 5\' 9"  (1.753 m)   Wt 194 lb (88 kg)   SpO2 98%   BMI 28.65 kg/m     Wt Readings from Last 3 Encounters:  02/17/23 194 lb (88 kg)  02/12/23 194 lb 6.4 oz (88.2 kg)  01/20/23 193 lb (87.5 kg)     GEN:  Well nourished, well developed in no acute distress HEENT: Normal NECK: No JVD; No carotid bruits LYMPHATICS: No lymphadenopathy CARDIAC: RRR, no murmurs, no rubs, no gallops RESPIRATORY:  Clear to auscultation without rales, wheezing or rhonchi  ABDOMEN: Soft, non-tender, non-distended MUSCULOSKELETAL:  No edema; No deformity  SKIN: Warm and dry NEUROLOGIC:  Alert and oriented x 3 PSYCHIATRIC:  Normal affect   ASSESSMENT:    1. Palpitations   2. Atypical chest pain   3. Dyspnea on exertion   4. Dyslipidemia   5. Late effect of cerebrovascular accident (CVA)    PLAN:    In order of problems listed above:  Palpitations/tachycardia  recorded by [employer].  I will ask him to wear Zio patch to make sure he does not have any paroxysms of atrial fibrillation I do this especially in view of the fact that he did have CVA before which is ischemic in still unclear etiology.  Supportive avolition echocardiogram will be done to assess left ventricle ejection fraction. Atypical chest pain the leading symptoms is fatigue tiredness and shortness of breath but also does have some atypical chest pain.  Will schedule him to have echocardiogram to assess left ventricle ejection fraction as well as coronary CT angio to rule out any coronary artery disease.  He is already on antiplatelet therapy, however he does not want to take statin until we have more information about his coronary artery status. Dyspnea exertion echocardiogram coronary CT angio. Dyslipidemia I did review K PN data from January 06, 2023 showing LDL 164 HDL 48.9.  He does have prescription for Lipitor however does not want to take it.  Will talk after stress testing. Late effect of CVA.  Noted.   Medication Adjustments/Labs and Tests Ordered: Current medicines are reviewed at length with the patient today.  Concerns regarding medicines are outlined above.  No orders of the defined types were placed in this encounter.  No orders of the defined types were placed in this encounter.   Signed, Georgeanna Lea, MD, [facility]. 02/17/2023 11:09 AM    [facility]

## 2023-02-20 ENCOUNTER — Ambulatory Visit (HOSPITAL_BASED_OUTPATIENT_CLINIC_OR_DEPARTMENT_OTHER)
Admission: RE | Admit: 2023-02-20 | Discharge: 2023-02-20 | Disposition: A | Discharge status: Home / Self Care | Payer: 59 | Source: Ambulatory Visit | Attending: Cardiology | Admitting: Cardiology

## 2023-02-20 DIAGNOSIS — R002 Palpitations: Secondary | ICD-10-CM | POA: Insufficient documentation

## 2023-02-20 DIAGNOSIS — I6930 Unspecified sequelae of cerebral infarction: Secondary | ICD-10-CM | POA: Diagnosis present

## 2023-02-20 DIAGNOSIS — E785 Hyperlipidemia, unspecified: Secondary | ICD-10-CM | POA: Diagnosis present

## 2023-02-20 DIAGNOSIS — R0789 Other chest pain: Secondary | ICD-10-CM | POA: Insufficient documentation

## 2023-02-20 DIAGNOSIS — R0609 Other forms of dyspnea: Secondary | ICD-10-CM | POA: Diagnosis present

## 2023-02-20 LAB — ECHOCARDIOGRAM COMPLETE
Area-P 1/2: 3.12 cm2
S' Lateral: 2.5 cm

## 2023-02-25 ENCOUNTER — Telehealth: Payer: Self-pay | Admitting: Cardiology

## 2023-02-25 NOTE — Telephone Encounter (Signed)
Patient is requesting call back to speak with Dr. Bing Matter. He did not want to give further information.

## 2023-02-26 ENCOUNTER — Ambulatory Visit (HOSPITAL_COMMUNITY): Admission: RE | Admit: 2023-02-26 | Payer: 59 | Source: Ambulatory Visit

## 2023-02-26 ENCOUNTER — Encounter (HOSPITAL_COMMUNITY): Payer: Self-pay

## 2023-02-26 ENCOUNTER — Telehealth: Payer: Self-pay

## 2023-02-26 LAB — BASIC METABOLIC PANEL
BUN/Creatinine Ratio: 17 (ref 10–24)
BUN: 18 mg/dL (ref 8–27)
CO2: 23 mmol/L (ref 20–29)
Calcium: 9.5 mg/dL (ref 8.6–10.2)
Chloride: 104 mmol/L (ref 96–106)
Creatinine, Ser: 1.03 mg/dL (ref 0.76–1.27)
Glucose: 96 mg/dL (ref 70–99)
Potassium: 4.5 mmol/L (ref 3.5–5.2)
Sodium: 141 mmol/L (ref 134–144)
eGFR: 82 mL/min/{1.73_m2} (ref >59)

## 2023-02-26 NOTE — Telephone Encounter (Signed)
Follow Up:    Patient is calling to follow up on the call he made yesterday.

## 2023-02-26 NOTE — Telephone Encounter (Signed)
Patient notified through my chart.

## 2023-02-26 NOTE — Telephone Encounter (Signed)
-----   Message from Robert J Krasowski, MD sent at 02/24/2023 12:23 PM EDT ----- Echocardiogram showed preserved left ventricle ejection fraction overall looks good 

## 2023-02-26 NOTE — Telephone Encounter (Signed)
-----   Message from Georgeanna Lea, MD sent at 02/24/2023 12:23 PM EDT ----- Echocardiogram showed preserved left ventricle ejection fraction overall looks good

## 2023-02-27 MED ORDER — METOPROLOL TARTRATE 25 MG PO TABS: 25.0000 mg | ORAL_TABLET | Freq: Two times a day (BID) | ORAL | 3 refills | Status: DC

## 2023-02-27 NOTE — Telephone Encounter (Signed)
Spoke with pt regarding message and Dr. Vanetta Shawl note. Pt agreed to try Metoprolol 25mg  1 twice daily. Sent to [facility] on [address].

## 2023-03-05 ENCOUNTER — Telehealth (HOSPITAL_COMMUNITY): Payer: Self-pay | Admitting: *Deleted

## 2023-03-05 NOTE — Telephone Encounter (Signed)
Reaching out to patient to offer assistance regarding upcoming cardiac imaging study; pt verbalizes understanding of appt date/time, parking situation and where to check in, pre-test NPO status and medications ordered, and verified current allergies; name and call back number provided for further questions should they arise  Iain Sawchuk RN Navigator Cardiac Imaging [facility] [phone] office [phone] cell  Patient to take 100mg metoprolol tartrate two hours prior to his cardiac CT scan.  He is aware to arrive at 12pm. 

## 2023-03-06 ENCOUNTER — Ambulatory Visit (HOSPITAL_COMMUNITY)
Admission: RE | Admit: 2023-03-06 | Discharge: 2023-03-06 | Disposition: A | Discharge status: Home / Self Care | Payer: 59 | Source: Ambulatory Visit | Attending: Cardiology | Admitting: Cardiology

## 2023-03-06 ENCOUNTER — Telehealth: Payer: Self-pay

## 2023-03-06 DIAGNOSIS — R0609 Other forms of dyspnea: Secondary | ICD-10-CM | POA: Insufficient documentation

## 2023-03-06 DIAGNOSIS — R002 Palpitations: Secondary | ICD-10-CM | POA: Diagnosis not present

## 2023-03-06 DIAGNOSIS — E785 Hyperlipidemia, unspecified: Secondary | ICD-10-CM | POA: Insufficient documentation

## 2023-03-06 DIAGNOSIS — R0789 Other chest pain: Secondary | ICD-10-CM | POA: Insufficient documentation

## 2023-03-06 DIAGNOSIS — R072 Precordial pain: Secondary | ICD-10-CM

## 2023-03-06 DIAGNOSIS — I6930 Unspecified sequelae of cerebral infarction: Secondary | ICD-10-CM | POA: Insufficient documentation

## 2023-03-06 MED ORDER — IOHEXOL 350 MG/ML SOLN
100.0000 mL | Freq: Once | INTRAVENOUS | Status: AC | PRN
  Administered 2023-03-06: 100 mL via INTRAVENOUS

## 2023-03-06 MED ORDER — NITROGLYCERIN 0.4 MG SL SUBL
0.8000 mg | SUBLINGUAL_TABLET | Freq: Once | SUBLINGUAL | Status: AC
  Administered 2023-03-06: 0.8 mg via SUBLINGUAL

## 2023-03-06 MED ORDER — NITROGLYCERIN 0.4 MG SL SUBL
SUBLINGUAL_TABLET | SUBLINGUAL | Status: AC
  Filled 2023-03-06: qty 2

## 2023-03-06 NOTE — Progress Notes (Signed)
CT scan completed. Tolerated well. D/C home ambulatory, awake and alert. In no distress. 

## 2023-03-06 NOTE — Telephone Encounter (Signed)
Patient notified through my chart.

## 2023-03-06 NOTE — Telephone Encounter (Signed)
-----   Message from Georgeanna Lea, MD sent at 02/27/2023 11:06 AM EDT ----- Chem-7 looks good, with coronary CT angio

## 2023-03-06 NOTE — Telephone Encounter (Signed)
-----   Message from Robert J Krasowski, MD sent at 02/27/2023 11:06 AM EDT ----- Chem-7 looks good, with coronary CT angio 

## 2023-03-17 ENCOUNTER — Telehealth: Payer: Self-pay | Admitting: Medical

## 2023-03-17 ENCOUNTER — Ambulatory Visit: Payer: 59 | Admitting: Medical

## 2023-03-17 ENCOUNTER — Encounter: Payer: Self-pay | Admitting: Medical

## 2023-03-17 VITALS — BP 140/80 | HR 87 | Resp 18 | Ht 69.0 in | Wt 197.8 lb

## 2023-03-17 DIAGNOSIS — M542 Cervicalgia: Secondary | ICD-10-CM

## 2023-03-17 DIAGNOSIS — R5383 Other fatigue: Secondary | ICD-10-CM | POA: Diagnosis not present

## 2023-03-17 DIAGNOSIS — D649 Anemia, unspecified: Secondary | ICD-10-CM | POA: Diagnosis not present

## 2023-03-17 DIAGNOSIS — M792 Neuralgia and neuritis, unspecified: Secondary | ICD-10-CM | POA: Diagnosis not present

## 2023-03-17 LAB — CBC WITH DIFFERENTIAL/PLATELET
Basophils Absolute: 0.1 10*3/uL (ref 0.0–0.1)
Basophils Relative: 0.8 % (ref 0.0–3.0)
Eosinophils Absolute: 0.3 10*3/uL (ref 0.0–0.7)
Eosinophils Relative: 3.9 % (ref 0.0–5.0)
HCT: 46.9 % (ref 39.0–52.0)
Hemoglobin: 14.8 g/dL (ref 13.0–17.0)
Lymphocytes Relative: 33.3 % (ref 12.0–46.0)
Lymphs Abs: 2.2 10*3/uL (ref 0.7–4.0)
MCHC: 31.7 g/dL (ref 30.0–36.0)
MCV: 82.3 fl (ref 78.0–100.0)
Monocytes Absolute: 0.6 10*3/uL (ref 0.1–1.0)
Monocytes Relative: 9.7 % (ref 3.0–12.0)
Neutro Abs: 3.5 10*3/uL (ref 1.4–7.7)
Neutrophils Relative %: 52.3 % (ref 43.0–77.0)
Platelets: 190 10*3/uL (ref 150.0–400.0)
RBC: 5.7 Mil/uL (ref 4.22–5.81)
RDW: 17.3 % — ABNORMAL HIGH (ref 11.5–15.5)
WBC: 6.6 10*3/uL (ref 4.0–10.5)

## 2023-03-17 NOTE — Progress Notes (Signed)
Subjective:    Patient ID: Tyler Obrien, male    DOB: 1961/04/20, 62 y.o.   MRN: [id number]  HPI  Pt in states his energy level is still varied.   Last AVS showed anemia.  Pt had anemia and low iron. He has been on iron. On last visit. Had advised stop iron since level anemia had normalized and iron level went above upper limit.  In past fecal ocult blood test was negative.  On prior notes visit gi hx.   "Subtle exophytic lesion associated with the lesser curvature in the antral pre-pyloric region of the stomach concerning for neoplasm such as GI stromal tumor (GIST). Outpatient referral to GI for further evaluation and probable endoscopy and tissue sampling is strongly recommended in the near future to exclude underlying malignancy."   Pt sees Dr. Rhea Belton.  Up to date on colonoscopy. Told to repeat in 2020.  Some mild pain on eating/feeling bloated. Full easier few weaks. He attributes to b blocker.    He had work up.  for for tachycardia. Today on walking his pulse 108. This was on mild walk.   Pt state metoprolol he felt more fatigued, ha,wt gain and bodyaches.  He states he stopped taking med on Friday. He states he had does halfed per other cardiologist recommendation but still felt like had side effects. Pt stopped med on Thursday afternoon and progressively feeling better.  Pt had ziopatch done by cardiologist.Results not back yet.  Still having some pain radiating to left upper extremity. Pain is minimal now. Was constant neck pain with some more pain that ran down arm. Now neck pain minimal but intermitent tinging to arm. Pt wants to be evaluated for this. Pt used gabapentin at night and seemed to help. He stopped on Friday.    Review of Systems  Constitutional:  Negative for chills, fatigue and fever.  HENT:  Negative for congestion and drooling.   Respiratory:  Negative for cough, chest tightness, shortness of breath and wheezing.   Cardiovascular:   Negative for chest pain and palpitations.  Gastrointestinal:  Negative for abdominal pain, constipation, diarrhea and rectal pain.  Genitourinary:  Negative for dysuria, flank pain and frequency.  Musculoskeletal:  Positive for neck pain. Negative for back pain.       Left side neck pain  Neurological:  Negative for dizziness and headaches.  Hematological:  Negative for adenopathy. Does not bruise/bleed easily.  Psychiatric/Behavioral:  Negative for behavioral problems, decreased concentration and dysphoric mood.    Past Medical History:  Diagnosis Date   Allergy    Arthritis    Bleeding gastric ulcer    Blood transfusion without reported diagnosis    CVA (cerebral infarction) 2011   Diverticulosis    GERD (gastroesophageal reflux disease)    prilosec as needed- upset stomach mostly - started after gastric bleeding ulcer   Kidney stone    Stroke [facility]) 2011     Social History   Socioeconomic History   Marital status: Married    Spouse name: Not on file   Number of children: Not on file   Years of education: Not on file   Highest education level: Bachelor's degree (e.g., BA, AB, BS)  Occupational History   Not on file  Tobacco Use   Smoking status: Never   Smokeless tobacco: Former  Building services engineer Use: Never used  Substance and Sexual Activity   Alcohol use: Not Currently   Drug use: No   Sexual activity:  Not on file  Other Topics Concern   Not on file  Social History Narrative   Not on file   Social Determinants of Health   Financial Resource Strain: Low Risk  (01/20/2023)   Overall Financial Resource Strain (CARDIA)    Difficulty of Paying Living Expenses: Not hard at all  Food Insecurity: Not on file  Transportation Needs: No Transportation Needs (01/20/2023)   PRAPARE - Administrator, Civil Service (Medical): No    Lack of Transportation (Non-Medical): No  Physical Activity: Insufficiently Active (01/20/2023)   Exercise Vital Sign    Days of  Exercise per Week: 1 day    Minutes of Exercise per Session: 20 min  Stress: Stress Concern Present (01/20/2023)   [employer] of Occupational Health - Occupational Stress Questionnaire    Feeling of Stress : To some extent  Social Connections: Socially Integrated (01/20/2023)   Social Connection and Isolation Panel [NHANES]    Frequency of Communication with Friends and Family: More than three times a week    Frequency of Social Gatherings with Friends and Family: Twice a week    Attends Religious Services: More than 4 times per year    Active Member of [employer] or Organizations: Yes    Attends Engineer, structural: More than 4 times per year    Marital Status: Married  Catering manager Violence: Not on file    Past Surgical History:  Procedure Laterality Date   APPENDECTOMY     COLONOSCOPY     10 yrs ago    cyst removal from scrotum     cyst removed from elbow     CYSTOSCOPY/URETEROSCOPY/HOLMIUM LASER/STENT PLACEMENT Left 11/20/2022   Procedure: LEFT URETEROSCOPY/HOLMIUM LASER/STENT PLACEMENT;  Surgeon: Despina Arias, MD;  Location: [facility];  Service: Urology;  Laterality: Left;   FINGER FRACTURE SURGERY Right    5th finger   KIDNEY STONE SURGERY     NASAL SINUS SURGERY  08/2009   OPEN ANTERIOR SHOULDER RECONSTRUCTION     UPPER GASTROINTESTINAL ENDOSCOPY     10 yrs ago    WISDOM TOOTH EXTRACTION      Family History  Problem Relation Age of Onset   Cancer Father        Lung CA   Hyperlipidemia Father    Hypertension Father    Lung cancer Father    Cancer Mother    Endometrial cancer Mother    Stomach cancer Maternal Grandfather    Colon cancer Neg Hx    Colon polyps Neg Hx    Esophageal cancer Neg Hx    Rectal cancer Neg Hx     Allergies  Allergen Reactions   Nsaids Other (See Comments)    ULCER   Lidocaine-Menthol Rash    Current Outpatient Medications on File Prior to Visit  Medication Sig Dispense Refill   metoprolol tartrate (LOPRESSOR) 25  MG tablet Take 1 tablet (25 mg total) by mouth 2 (two) times daily. 180 tablet 3   Ascorbic Acid (VITAMIN C) 100 MG tablet Take 100 mg by mouth daily.     aspirin EC 81 MG tablet Take 81 mg by mouth daily.     azelastine (ASTELIN) 0.1 % nasal spray Place 2 sprays into both nostrils 2 (two) times daily as needed for allergies or rhinitis.     b complex vitamins tablet Take 1 tablet by mouth daily.     Cholecalciferol (VITAMIN D3) 75 MCG (3000 UT) TABS Take 3,000 Units by mouth  daily.     Coenzyme Q10 (COQ-10 PO) Take 1 capsule by mouth daily.     gabapentin (NEURONTIN) 100 MG capsule Take 1 capsule (100 mg total) by mouth 3 (three) times daily. 90 capsule 0   guaiFENesin (MUCINEX) 600 MG 12 hr tablet Take 600 mg by mouth 2 (two) times daily as needed for cough or to loosen phlegm.     Omega-3 Fatty Acids (OMEGA 3 PO) Take 1 capsule by mouth daily.     omeprazole (PRILOSEC) 20 MG capsule Take 20 mg by mouth daily as needed (acid reflux).     No current facility-administered medications on file prior to visit.    BP (!) 140/80   Pulse 87   Resp 18   Ht 5\' 9"  (1.753 m)   Wt 197 lb 12.8 oz (89.7 kg)   SpO2 94%   BMI 29.21 kg/m           Objective:   Physical Exam  General Mental Status- Alert. General Appearance- Not in acute distress.   Skin General: Color- Normal Color. Moisture- Normal Moisture.  Neck Left trapezius faint tenderness. No mid c spine pain.  Chest and Lung Exam Auscultation: Breath Sounds:-Normal.  Cardiovascular Auscultation:Rythm- Regular. Murmurs & Other Heart Sounds:Auscultation of the heart reveals- No Murmurs.  Abdomen Inspection:-Inspeection Normal. Palpation/Percussion:Note:No mass. Palpation and Percussion of the abdomen reveal- faint  Tenderness left mid abdomen area and to left, Non Distended + BS, no rebound or guarding.   Neurologic Cranial Nerve exam:- CN III-XII intact(No nystagmus), symmetric smile. Strength:- 5/5 equal and symmetric  strength both upper and lower extremities.      Assessment & Plan:   Patient Instructions   Anemia, unspecified type - CBC w/Diff - Iron, TIBC and Ferritin Panel  Radicular  type pain in left arm  and Neck pain Can use gabapantin at night if needed. Will rever to sports med MD.   Fatigue, unspecified type Work up done and so far no obvious cause other than anemia but that has recently been normalized. Recheck to see if drop again in hb/hct and iron level. If so update you GI MD.  Tachycardia. Could not tolerate metoprolol. Will try to get results of ziopatch from cardiologist. If high heart rate they may advise other meds such as calcium channel blocker.  Follow up date to be determined after lab review.    Esperanza Richters, PA-C   Time spent with patient today was  41 minutes which consisted of chart review, discussing diagnosis, work up treatment and documentation.

## 2023-03-17 NOTE — Telephone Encounter (Signed)
Dr. Bing Matter,  Pt states had various side effect with metoprolol. Fatigue,, ha,wt gain and bodyaches? He stopped and symptoms improved. Not sure if need other med CA blocker type. I could not see his zio patch results.  Just want to update you.  Thanks,  Esperanza Richters, PA-C

## 2023-03-17 NOTE — Patient Instructions (Addendum)
Anemia, unspecified type - CBC w/Diff - Iron, TIBC and Ferritin Panel  Radicular  type pain in left arm  and Neck pain Can use gabapentin at night if needed. Will rever to sports med MD.   Fatigue, unspecified type Work up done and so far no obvious cause other than anemia but that has recently been normalized. Recheck to see if drop again in hb/hct and iron level. If so update you GI MD.  Tachycardia. Could not tolerate metoprolol. Will try to get results of ziopatch from cardiologist. If high heart rate they may advise other meds such as calcium channel blocker.  Follow up date to be determined after lab review.

## 2023-03-18 LAB — IRON,TIBC AND FERRITIN PANEL
%SAT: 20 % (calc) (ref 20–48)
Ferritin: 8 ng/mL — ABNORMAL LOW (ref 24–380)
Iron: 77 ug/dL (ref 50–180)
TIBC: 382 mcg/dL (calc) (ref 250–425)

## 2023-03-19 NOTE — Progress Notes (Signed)
[facility] [address] [place] [address] Phone: [phone]   Assessment and Plan:     1. Neck pain 2. DDD (degenerative disc disease), cervical 3. Left cervical radiculopathy  -Chronic with exacerbation, initial sports medicine visit - Persistent neck pain with left-sided radicular symptoms that are worsening gradually over the last 6 months.  Concerning the patient describes decreased strength in left upper extremity - Reviewed x-ray with patient in room.  Discussed degenerative changes seen most significantly at C3-4-5.  Also discussed how patient's decreased sensation fits better with a C6-7-8 distribution - Due to patient's failure to improve with >6 weeks of conservative therapy, decree sensation on physical exam, reported weakness in left upper extremity, pain at times >6/10, pain limiting day-to-day activities, I recommend proceeding with C-spine MRI.  Order placed today - Start meloxicam 15 mg daily x2 weeks.  Do not to use additional NSAIDs while taking meloxicam.  May use Tylenol [phone] mg 2 to 3 times a day for breakthrough pain.  Patient had history of bleeding ulcer in 1992 from excessive NSAID use.  Discussed that we will only use a short course of NSAIDs to prevent recurrent ulcer - Start HEP for neck  Pertinent previous records reviewed include family medicine note 12/30/2022, family medicine note 01/20/2023, C-spine x-ray 12/09/2022   Follow Up: 4 days after MRI to review results and discuss treatment plan   Subjective:   I, Tyler Obrien, am serving as a Neurosurgeon for Doctor Richardean Sale  Chief Complaint: neck pain   HPI:   03/20/2023 Patient is a 62 year old male complaining of neck pain. Patient states that he has had a pinched something since January pain radiates down to his arm with some numbness and tingling, no MOI, no meds for the pain , gabapentin as needed from PCP. Pain has decreased since  January hasn't resolved itself. Decreased ROM due to pain. Has been seeing chiro and that helps, hx of anemia and high heart rate and when the heart rate gets going the pain increases   Relevant Historical Information: History of stomach ulcer in 1992 after excessive NSAID use.  Additional pertinent review of systems negative.   Current Outpatient Medications:    Ascorbic Acid (VITAMIN C) 100 MG tablet, Take 100 mg by mouth daily., Disp: , Rfl:    aspirin EC 81 MG tablet, Take 81 mg by mouth daily., Disp: , Rfl:    azelastine (ASTELIN) 0.1 % nasal spray, Place 2 sprays into both nostrils 2 (two) times daily as needed for allergies or rhinitis., Disp: , Rfl:    b complex vitamins tablet, Take 1 tablet by mouth daily., Disp: , Rfl:    Cholecalciferol (VITAMIN D3) 75 MCG (3000 UT) TABS, Take 3,000 Units by mouth daily., Disp: , Rfl:    Coenzyme Q10 (COQ-10 PO), Take 1 capsule by mouth daily., Disp: , Rfl:    gabapentin (NEURONTIN) 100 MG capsule, Take 1 capsule (100 mg total) by mouth 3 (three) times daily., Disp: 90 capsule, Rfl: 0   guaiFENesin (MUCINEX) 600 MG 12 hr tablet, Take 600 mg by mouth 2 (two) times daily as needed for cough or to loosen phlegm., Disp: , Rfl:    meloxicam (MOBIC) 15 MG tablet, Take 1 tablet (15 mg total) by mouth daily., Disp: 14 tablet, Rfl: 0   metoprolol tartrate (LOPRESSOR) 25 MG tablet, Take 1 tablet (25 mg total) by mouth 2 (two) times daily., Disp: 180 tablet,  Rfl: 3   Omega-3 Fatty Acids (OMEGA 3 PO), Take 1 capsule by mouth daily., Disp: , Rfl:    omeprazole (PRILOSEC) 20 MG capsule, Take 20 mg by mouth daily as needed (acid reflux)., Disp: , Rfl:    Objective:     Vitals:   03/20/23 0757  BP: 118/76  Pulse: 81  SpO2: 96%  Weight: 196 lb (88.9 kg)  Height: 5\' 9"  (1.753 m)      Body mass index is 28.94 kg/m.    Physical Exam:    Neck Exam: Cervical Spine- Posture normal Skin- normal, intact  Neuro:  Strength-  Right Left   Deltoid (C5) 5/5  5/5  Bicep/Brachioradialis (C5/6) 5/5  5/5  Wrist Extension (C6) 5/5 5/5  Tricep (C7) 5/5 5/5  Wrist Flexion (C7) 5/5 5/5  Grip (C8) 5/5 5/5  Finger Abduction (T1) 5/5 5/5   Sensation: Mildly decreased sensation over left hand and left ulnar forearm, and left upper arm compared to right.  Otherwise intact to light touch in upper extremities bilaterally  Spurling's:  negative bilaterally Neck ROM: Decreased extension.  Otherwise, full active ROM     Electronically signed by:  Tyler Obrien [facility] 8:25 AM 03/20/23

## 2023-03-20 ENCOUNTER — Encounter: Payer: Self-pay | Admitting: Medical

## 2023-03-20 ENCOUNTER — Ambulatory Visit: Payer: 59 | Admitting: Sports Medicine

## 2023-03-20 ENCOUNTER — Encounter: Payer: Self-pay | Admitting: Cardiology

## 2023-03-20 ENCOUNTER — Telehealth: Payer: Self-pay

## 2023-03-20 VITALS — BP 118/76 | HR 81 | Ht 69.0 in | Wt 196.0 lb

## 2023-03-20 DIAGNOSIS — M5412 Radiculopathy, cervical region: Secondary | ICD-10-CM

## 2023-03-20 DIAGNOSIS — M542 Cervicalgia: Secondary | ICD-10-CM

## 2023-03-20 DIAGNOSIS — M5030 Other cervical disc degeneration, unspecified cervical region: Secondary | ICD-10-CM

## 2023-03-20 MED ORDER — DILTIAZEM HCL ER COATED BEADS 120 MG PO CP24: 120.0000 mg | ORAL_CAPSULE | Freq: Every day | ORAL | 3 refills | Status: DC

## 2023-03-20 MED ORDER — MELOXICAM 15 MG PO TABS: 15.0000 mg | ORAL_TABLET | Freq: Every day | ORAL | 0 refills | Status: DC

## 2023-03-20 NOTE — Telephone Encounter (Signed)
Left message on My Chart with normal results per Dr. Krasowski's note. Routed to PCP. 

## 2023-03-20 NOTE — Patient Instructions (Addendum)
Neck HEP  - Start meloxicam 15 mg daily x2 weeks. Do not to use additional NSAIDs while taking meloxicam.  May use Tylenol [phone] mg 2 to 3 times a day for breakthrough pain. Mri neck  Follow up 4 days after to discuss results

## 2023-03-21 ENCOUNTER — Encounter: Payer: Self-pay | Admitting: Medical

## 2023-03-22 NOTE — Addendum Note (Signed)
Addended by: Gwenevere Abbot on: 03/22/2023 10:03 AM   Modules accepted: Orders

## 2023-03-25 ENCOUNTER — Encounter: Payer: Self-pay | Admitting: Sports Medicine

## 2023-03-30 ENCOUNTER — Ambulatory Visit (INDEPENDENT_AMBULATORY_CARE_PROVIDER_SITE_OTHER): Payer: 59

## 2023-03-30 DIAGNOSIS — M5030 Other cervical disc degeneration, unspecified cervical region: Secondary | ICD-10-CM

## 2023-03-30 DIAGNOSIS — R933 Abnormal findings on diagnostic imaging of other parts of digestive tract: Secondary | ICD-10-CM

## 2023-03-30 DIAGNOSIS — M5412 Radiculopathy, cervical region: Secondary | ICD-10-CM | POA: Diagnosis not present

## 2023-03-30 DIAGNOSIS — M542 Cervicalgia: Secondary | ICD-10-CM | POA: Diagnosis not present

## 2023-03-30 DIAGNOSIS — E611 Iron deficiency: Secondary | ICD-10-CM

## 2023-03-31 NOTE — Progress Notes (Signed)
Tyler Obrien [facility] [address] [place] [address] Phone: [phone]   Assessment and Plan:     1. Neck pain 2. DDD (degenerative disc disease), cervical 3. Left cervical radiculopathy -Chronic with exacerbation, subsequent visit - Improvement in neck pain after completing course of meloxicam, though continued left-sided radicular symptoms - Reviewed cervical spine MRI with patient which showed multilevel degenerative changes at C3-C7, and most severe at C3-C4 with osteophyte complex, severe left foraminal stenosis, severe bilateral foraminal stenosis at C4-5 that could explain patient's ongoing neurologic symptoms - Based on continued neurologic changes, degenerative findings on MRI, I recommend that patient be evaluated for neurosurgery to further discuss surgical versus conservative options - May discontinue meloxicam at this time and use remainder as needed - May continue physical activity as tolerated   Pertinent previous records reviewed include C-spine MRI 03/30/2023   Follow Up: As needed   Subjective:   I, Tyler Obrien, am serving as a Neurosurgeon for Doctor Richardean Sale   Chief Complaint: neck pain    HPI:    03/20/2023 Patient is a 62 year old male complaining of neck pain. Patient states that he has had a pinched something since January pain radiates down to his arm with some numbness and tingling, no MOI, no meds for the pain , gabapentin as needed from PCP. Pain has decreased since January hasn't resolved itself. Decreased ROM due to pain. Has been seeing chiro and that helps, hx of anemia and high heart rate and when the heart rate gets going the pain increases   04/07/2023 Patient states that he is alright , he states he is anemic     Relevant Historical Information: History of stomach ulcer in 1992 after excessive NSAID use.    Additional pertinent review of systems negative.   Current Outpatient  Medications:    Ascorbic Acid (VITAMIN C) 100 MG tablet, Take 100 mg by mouth daily., Disp: , Rfl:    aspirin EC 81 MG tablet, Take 81 mg by mouth daily., Disp: , Rfl:    azelastine (ASTELIN) 0.1 % nasal spray, Place 2 sprays into both nostrils 2 (two) times daily as needed for allergies or rhinitis., Disp: , Rfl:    b complex vitamins tablet, Take 1 tablet by mouth daily., Disp: , Rfl:    Cholecalciferol (VITAMIN D3) 75 MCG (3000 UT) TABS, Take 3,000 Units by mouth daily., Disp: , Rfl:    Coenzyme Q10 (COQ-10 PO), Take 1 capsule by mouth daily. 100 mg, Disp: , Rfl:    Collagen Hydrolysate POWD, Take by mouth daily at 6 (six) AM. Collagen powder, Disp: , Rfl:    diltiazem (CARDIZEM CD) 120 MG 24 hr capsule, Take 1 capsule (120 mg total) by mouth daily., Disp: 90 capsule, Rfl: 3   gabapentin (NEURONTIN) 100 MG capsule, Take 1 capsule (100 mg total) by mouth 3 (three) times daily., Disp: 90 capsule, Rfl: 0   guaiFENesin (MUCINEX) 600 MG 12 hr tablet, Take 600 mg by mouth 2 (two) times daily as needed for cough or to loosen phlegm., Disp: , Rfl:    Magnesium 250 MG TABS, Take 250 mg by mouth daily at 6 (six) AM., Disp: , Rfl:    meloxicam (MOBIC) 15 MG tablet, Take 1 tablet (15 mg total) by mouth daily., Disp: 14 tablet, Rfl: 0   metoprolol tartrate (LOPRESSOR) 25 MG tablet, Take 1 tablet (25 mg total) by mouth 2 (two) times daily. (Patient not taking:  Reported on 04/04/2023), Disp: 180 tablet, Rfl: 3   Omega-3 Fatty Acids (OMEGA 3 PO), Take 1 capsule by mouth daily., Disp: , Rfl:    omeprazole (PRILOSEC) 20 MG capsule, Take 20 mg by mouth daily as needed (acid reflux)., Disp: , Rfl:    Objective:     Vitals:   04/07/23 1522  BP: 120/82  Pulse: 94  SpO2: 96%  Weight: 197 lb (89.4 kg)  Height: 5\' 9"  (1.753 m)      Body mass index is 29.09 kg/m.    Physical Exam:     Neck Exam: Cervical Spine- Posture normal Skin- normal, intact   Neuro:  Strength-   Right Left  Deltoid (C5) 5/5  5/5 Bicep/Brachioradialis (C5/6) 5/5  5/5 Wrist Extension (C6) 5/5 5/5 Tricep (C7) 5/5 5/5 Wrist Flexion (C7) 5/5 5/5 Grip (C8) 5/5 5/5 Finger Abduction (T1) 5/5 5/5   Sensation: Mildly decreased sensation over left hand and left ulnar forearm, and left upper arm compared to right.  Otherwise intact to light touch in upper extremities bilaterally   Spurling's:  negative bilaterally Neck ROM: Decreased extension.  Otherwise, full active ROM    Electronically signed by:  Tyler [facility]rien [facility] 4:16 PM 04/07/23

## 2023-04-04 ENCOUNTER — Inpatient Hospital Stay: Payer: 59 | Attending: Family | Admitting: Family

## 2023-04-04 ENCOUNTER — Inpatient Hospital Stay: Payer: 59

## 2023-04-04 ENCOUNTER — Encounter: Payer: Self-pay | Admitting: Family

## 2023-04-04 VITALS — BP 132/94 | HR 95 | Temp 97.7°F | Resp 18 | Ht 69.0 in | Wt 196.1 lb

## 2023-04-04 DIAGNOSIS — Z9049 Acquired absence of other specified parts of digestive tract: Secondary | ICD-10-CM

## 2023-04-04 DIAGNOSIS — Z8349 Family history of other endocrine, nutritional and metabolic diseases: Secondary | ICD-10-CM | POA: Insufficient documentation

## 2023-04-04 DIAGNOSIS — R5383 Other fatigue: Secondary | ICD-10-CM | POA: Diagnosis not present

## 2023-04-04 DIAGNOSIS — R202 Paresthesia of skin: Secondary | ICD-10-CM | POA: Diagnosis not present

## 2023-04-04 DIAGNOSIS — R319 Hematuria, unspecified: Secondary | ICD-10-CM

## 2023-04-04 DIAGNOSIS — R002 Palpitations: Secondary | ICD-10-CM | POA: Insufficient documentation

## 2023-04-04 DIAGNOSIS — K5900 Constipation, unspecified: Secondary | ICD-10-CM

## 2023-04-04 DIAGNOSIS — Z800 Family history of malignant neoplasm of digestive organs: Secondary | ICD-10-CM | POA: Insufficient documentation

## 2023-04-04 DIAGNOSIS — Z8249 Family history of ischemic heart disease and other diseases of the circulatory system: Secondary | ICD-10-CM | POA: Insufficient documentation

## 2023-04-04 DIAGNOSIS — R42 Dizziness and giddiness: Secondary | ICD-10-CM | POA: Insufficient documentation

## 2023-04-04 DIAGNOSIS — K5730 Diverticulosis of large intestine without perforation or abscess without bleeding: Secondary | ICD-10-CM | POA: Diagnosis not present

## 2023-04-04 DIAGNOSIS — R609 Edema, unspecified: Secondary | ICD-10-CM | POA: Diagnosis not present

## 2023-04-04 DIAGNOSIS — Z809 Family history of malignant neoplasm, unspecified: Secondary | ICD-10-CM

## 2023-04-04 DIAGNOSIS — D509 Iron deficiency anemia, unspecified: Secondary | ICD-10-CM | POA: Insufficient documentation

## 2023-04-04 DIAGNOSIS — Z79899 Other long term (current) drug therapy: Secondary | ICD-10-CM | POA: Diagnosis not present

## 2023-04-04 DIAGNOSIS — Z886 Allergy status to analgesic agent status: Secondary | ICD-10-CM | POA: Diagnosis not present

## 2023-04-04 DIAGNOSIS — Z7982 Long term (current) use of aspirin: Secondary | ICD-10-CM

## 2023-04-04 DIAGNOSIS — Z8049 Family history of malignant neoplasm of other genital organs: Secondary | ICD-10-CM | POA: Insufficient documentation

## 2023-04-04 DIAGNOSIS — R0602 Shortness of breath: Secondary | ICD-10-CM | POA: Insufficient documentation

## 2023-04-04 DIAGNOSIS — Z801 Family history of malignant neoplasm of trachea, bronchus and lung: Secondary | ICD-10-CM | POA: Diagnosis not present

## 2023-04-04 DIAGNOSIS — M79602 Pain in left arm: Secondary | ICD-10-CM | POA: Diagnosis not present

## 2023-04-04 DIAGNOSIS — Z8673 Personal history of transient ischemic attack (TIA), and cerebral infarction without residual deficits: Secondary | ICD-10-CM | POA: Insufficient documentation

## 2023-04-04 NOTE — Progress Notes (Signed)
Hematology/Oncology Consultation   Name: Tyler Obrien      MRN: [id number]    Location: Room/bed info not found  Date: 04/04/2023 Time:9:18 AM   REFERRING PHYSICIAN:  Esperanza Richters, PA-C  REASON FOR CONSULT:  Anemia    DIAGNOSIS: Iron deficiency anemia  HISTORY OF PRESENT ILLNESS: Tyler Obrien is a pleasant 62 yo gentleman with recent diagnosis of IDA. Iron saturation was only 20% and ferritin 8.  He denies past history of anemia or known family history.  He states that he donates blood every 2 months but is on hold for now due to anemia.  He had also had blood in his urine due to kidney stones. These were treated in February 2024 with left ureteroscopy, laser and stent placement.  No other blood loss noted. No abnormal bruising, no petechiae.  He is symptomatic with fatigue, 1 episode of dizziness, SOB with any exertion, palpitations and abdominal bloating.  He had history of bleeding gastric ulcer in 1992 due to taking NSAIDS.  He has chronic neck and left arm pain with tingling in the left hand. He had an MRI of the cervical spine with Dr. Chrisandra Netters and is waiting for follow-up to discuss results.  He has history of stroke in 2011 and is on 1 baby aspirin PO daily No personal history of cancer. His mother had endometrial and father had lung (smoker).  He has an appointment with dermatology for routine skin check in September.  He had his most recent colonoscopy in 09/2019. This showed diverticulosis of he left colon. EGD in 10/2022 was negative. He takes his Prilosec only as needed.  He did have a lesion noted on CT renal stone study in 10/2022. 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 GI has ordered a repeat CT on 04/08/2023 to re-evaluate.  No history of diabetes or thyroid disease.  No fever, chills, n/v, cough, rash, chest pain, abdominal pain or changes in bowel or bladder habits.  His constipation resolved once he stopped taking oral iron.   No swelling in his extremities at this time. He occasionally notices fluid retention. This was more frequent when he had the kidney stones.  No smoking, ETOH or recreational drug use.  Appetite and hydration are good. Weight is stable at 196 lbs.  He enjoys walking for exercise.   ROS: All other 10 point review of systems is negative.   PAST MEDICAL HISTORY:   Past Medical History:  Diagnosis Date   Allergy    Arthritis    Bleeding gastric ulcer    Blood transfusion without reported diagnosis    CVA (cerebral infarction) 2011   Diverticulosis    GERD (gastroesophageal reflux disease)    prilosec as needed- upset stomach mostly - started after gastric bleeding ulcer   Kidney stone    Stroke [facility]) 2011    ALLERGIES: Allergies  Allergen Reactions   Nsaids Other (See Comments)    ULCER   Lidocaine-Menthol Rash      MEDICATIONS:  Current Outpatient Medications on File Prior to Visit  Medication Sig Dispense Refill   Ascorbic Acid (VITAMIN C) 100 MG tablet Take 100 mg by mouth daily.     aspirin EC 81 MG tablet Take 81 mg by mouth daily.     azelastine (ASTELIN) 0.1 % nasal spray Place 2 sprays into both nostrils 2 (two) times daily as needed for allergies or rhinitis.     b complex vitamins tablet Take  1 tablet by mouth daily.     Cholecalciferol (VITAMIN D3) 75 MCG (3000 UT) TABS Take 3,000 Units by mouth daily.     Coenzyme Q10 (COQ-10 PO) Take 1 capsule by mouth daily. 100 mg     Collagen Hydrolysate POWD Take by mouth daily at 6 (six) AM. Collagen powder     gabapentin (NEURONTIN) 100 MG capsule Take 1 capsule (100 mg total) by mouth 3 (three) times daily. 90 capsule 0   guaiFENesin (MUCINEX) 600 MG 12 hr tablet Take 600 mg by mouth 2 (two) times daily as needed for cough or to loosen phlegm.     Magnesium 250 MG TABS Take 250 mg by mouth daily at 6 (six) AM.     meloxicam (MOBIC) 15 MG tablet Take 1 tablet (15 mg total) by mouth daily. 14 tablet 0   metoprolol tartrate  (LOPRESSOR) 25 MG tablet Take 1 tablet (25 mg total) by mouth 2 (two) times daily. (Patient not taking: Reported on 04/04/2023) 180 tablet 3   Omega-3 Fatty Acids (OMEGA 3 PO) Take 1 capsule by mouth daily.     omeprazole (PRILOSEC) 20 MG capsule Take 20 mg by mouth daily as needed (acid reflux).     diltiazem (CARDIZEM CD) 120 MG 24 hr capsule Take 1 capsule (120 mg total) by mouth daily. (Patient not taking: Reported on 04/04/2023) 90 capsule 3   No current facility-administered medications on file prior to visit.     PAST SURGICAL HISTORY Past Surgical History:  Procedure Laterality Date   APPENDECTOMY     COLONOSCOPY     10 yrs ago    cyst removal from scrotum     cyst removed from elbow     CYSTOSCOPY/URETEROSCOPY/HOLMIUM LASER/STENT PLACEMENT Left 11/20/2022   Procedure: LEFT URETEROSCOPY/HOLMIUM LASER/STENT PLACEMENT;  Surgeon: Despina Arias, MD;  Location: [facility];  Service: Urology;  Laterality: Left;   FINGER FRACTURE SURGERY Right    5th finger   KIDNEY STONE SURGERY     NASAL SINUS SURGERY  08/2009   OPEN ANTERIOR SHOULDER RECONSTRUCTION     UPPER GASTROINTESTINAL ENDOSCOPY     10 yrs ago    WISDOM TOOTH EXTRACTION      FAMILY HISTORY: Family History  Problem Relation Age of Onset   Cancer Father        Lung CA   Hyperlipidemia Father    Hypertension Father    Lung cancer Father    Cancer Mother    Endometrial cancer Mother    Stomach cancer Maternal Grandfather    Colon cancer Neg Hx    Colon polyps Neg Hx    Esophageal cancer Neg Hx    Rectal cancer Neg Hx     SOCIAL HISTORY:  reports that he has never smoked. He has quit using smokeless tobacco. He reports that he does not currently use alcohol. He reports that he does not use drugs.  PERFORMANCE STATUS: The patient's performance status is 1 - Symptomatic but completely ambulatory  PHYSICAL EXAM: Most Recent Vital Signs: Blood pressure (!) 132/94, pulse 95, temperature 97.7 F (36.5 C), temperature  source Oral, resp. rate 18, height 5\' 9"  (1.753 m), weight 196 lb 1.3 oz (88.9 kg), SpO2 97 %. BP (!) 132/94 (BP Location: Left Arm, Patient Position: Sitting)   Pulse 95   Temp 97.7 F (36.5 C) (Oral)   Resp 18   Ht 5\' 9"  (1.753 m)   Wt 196 lb 1.3 oz (88.9 kg)   SpO2  97%   BMI 28.96 kg/m   General Appearance:    Alert, cooperative, no distress, appears stated age  Head:    Normocephalic, without obvious abnormality, atraumatic  Eyes:    PERRL, conjunctiva/corneas clear, EOM's intact, fundi    benign, both eyes       Ears:    Normal TM's and external ear canals, both ears  Nose:   Nares normal, septum midline, mucosa normal, no drainage    or sinus tenderness  Throat:   Lips, mucosa, and tongue normal; teeth and gums normal  Neck:   Supple, symmetrical, trachea midline, no adenopathy;       thyroid:  No enlargement/tenderness/nodules; no carotid   bruit or JVD  Back:     Symmetric, no curvature, ROM normal, no CVA tenderness  Lungs:     Clear to auscultation bilaterally, respirations unlabored  Chest wall:    No tenderness or deformity  Heart:    Regular rate and rhythm, S1 and S2 normal, no murmur, rub   or gallop  Abdomen:     Soft, non-tender, bowel sounds active all four quadrants,    no masses, no organomegaly  Genitalia:    Normal male without lesion, discharge or tenderness  Rectal:    Normal tone, normal prostate, no masses or tenderness;   guaiac negative stool  Extremities:   Extremities normal, atraumatic, no cyanosis or edema  Pulses:   2+ and symmetric all extremities  Skin:   Skin color, texture, turgor normal, no rashes or lesions  Lymph nodes:   Cervical, supraclavicular, and axillary nodes normal  Neurologic:   CNII-XII intact. Normal strength, sensation and reflexes      throughout    LABORATORY DATA:  No results found for this or any previous visit (from the past 48 hour(s)).    RADIOGRAPHY: No results found.     PATHOLOGY: None   ASSESSMENT/PLAN: Mr.  Butsch is a pleasant 62 yo gentleman with recent diagnosis of IDA. Iron saturation was only 20% and ferritin 8.  We will get him set up for 3 doses of IV iron.  Follow-up in 8 weeks.   All questions were answered. The patient knows to call the clinic with any problems, questions or concerns. We can certainly see the patient much sooner if necessary.   Eileen Stanford, NP

## 2023-04-07 ENCOUNTER — Ambulatory Visit (INDEPENDENT_AMBULATORY_CARE_PROVIDER_SITE_OTHER): Payer: 59 | Admitting: Sports Medicine

## 2023-04-07 VITALS — BP 120/82 | HR 94 | Ht 69.0 in | Wt 197.0 lb

## 2023-04-07 DIAGNOSIS — M50322 Other cervical disc degeneration at C5-C6 level: Secondary | ICD-10-CM | POA: Diagnosis not present

## 2023-04-07 DIAGNOSIS — M50321 Other cervical disc degeneration at C4-C5 level: Secondary | ICD-10-CM | POA: Diagnosis not present

## 2023-04-07 DIAGNOSIS — M50323 Other cervical disc degeneration at C6-C7 level: Secondary | ICD-10-CM | POA: Diagnosis not present

## 2023-04-07 DIAGNOSIS — M5031 Other cervical disc degeneration,  high cervical region: Secondary | ICD-10-CM | POA: Diagnosis not present

## 2023-04-07 DIAGNOSIS — M5412 Radiculopathy, cervical region: Secondary | ICD-10-CM

## 2023-04-07 DIAGNOSIS — M5030 Other cervical disc degeneration, unspecified cervical region: Secondary | ICD-10-CM

## 2023-04-07 DIAGNOSIS — M542 Cervicalgia: Secondary | ICD-10-CM

## 2023-04-07 NOTE — Patient Instructions (Signed)
Neurosurgery referral  As needed follow up

## 2023-04-08 ENCOUNTER — Encounter (HOSPITAL_COMMUNITY): Payer: Self-pay

## 2023-04-08 ENCOUNTER — Ambulatory Visit (HOSPITAL_COMMUNITY)
Admission: RE | Admit: 2023-04-08 | Discharge: 2023-04-08 | Disposition: A | Discharge status: Home / Self Care | Payer: 59 | Source: Ambulatory Visit | Attending: Internal Medicine | Admitting: Internal Medicine

## 2023-04-08 DIAGNOSIS — R933 Abnormal findings on diagnostic imaging of other parts of digestive tract: Secondary | ICD-10-CM | POA: Diagnosis present

## 2023-04-08 MED ORDER — IOHEXOL 300 MG/ML  SOLN
100.0000 mL | Freq: Once | INTRAMUSCULAR | Status: AC | PRN
  Administered 2023-04-08: 100 mL via INTRAVENOUS

## 2023-04-14 ENCOUNTER — Encounter: Payer: Self-pay | Admitting: Family

## 2023-04-14 ENCOUNTER — Telehealth: Payer: Self-pay | Admitting: *Deleted

## 2023-04-14 NOTE — Telephone Encounter (Signed)
-----   Message from Chrystie Nose, RN sent at 04/14/2023 10:39 AM EDT ----- Result note.

## 2023-04-15 NOTE — Telephone Encounter (Signed)
See result note.  

## 2023-04-15 NOTE — Telephone Encounter (Signed)
Patient is returning your call.  

## 2023-04-16 ENCOUNTER — Inpatient Hospital Stay: Payer: 59 | Attending: Family

## 2023-04-16 VITALS — BP 126/86 | HR 89 | Temp 97.8°F | Resp 17

## 2023-04-16 DIAGNOSIS — Z809 Family history of malignant neoplasm, unspecified: Secondary | ICD-10-CM | POA: Insufficient documentation

## 2023-04-16 DIAGNOSIS — Z8049 Family history of malignant neoplasm of other genital organs: Secondary | ICD-10-CM | POA: Diagnosis not present

## 2023-04-16 DIAGNOSIS — Z8349 Family history of other endocrine, nutritional and metabolic diseases: Secondary | ICD-10-CM | POA: Diagnosis not present

## 2023-04-16 DIAGNOSIS — Z79899 Other long term (current) drug therapy: Secondary | ICD-10-CM | POA: Diagnosis not present

## 2023-04-16 DIAGNOSIS — Z801 Family history of malignant neoplasm of trachea, bronchus and lung: Secondary | ICD-10-CM | POA: Insufficient documentation

## 2023-04-16 DIAGNOSIS — Z8249 Family history of ischemic heart disease and other diseases of the circulatory system: Secondary | ICD-10-CM | POA: Insufficient documentation

## 2023-04-16 DIAGNOSIS — D509 Iron deficiency anemia, unspecified: Secondary | ICD-10-CM | POA: Diagnosis present

## 2023-04-16 DIAGNOSIS — Z800 Family history of malignant neoplasm of digestive organs: Secondary | ICD-10-CM | POA: Insufficient documentation

## 2023-04-16 MED ORDER — SODIUM CHLORIDE 0.9 % IV SOLN: Freq: Once | INTRAVENOUS | Status: AC

## 2023-04-16 MED ORDER — SODIUM CHLORIDE 0.9 % IV SOLN
300.0000 mg | Freq: Once | INTRAVENOUS | Status: AC
  Administered 2023-04-16: 300 mg via INTRAVENOUS
  Filled 2023-04-16: qty 300

## 2023-04-16 NOTE — Patient Instructions (Signed)

## 2023-04-17 NOTE — Telephone Encounter (Signed)
I'm not sure where the message went, but I had already said that this patient can move forward with procedure. Schedule EGD/EUS as available. Thanks. GM

## 2023-04-17 NOTE — Telephone Encounter (Signed)
Please advise if EUS scheduling is appropriate for this patient.Marland KitchenMarland KitchenMarland Kitchen

## 2023-04-23 ENCOUNTER — Other Ambulatory Visit: Payer: Self-pay

## 2023-04-23 DIAGNOSIS — R933 Abnormal findings on diagnostic imaging of other parts of digestive tract: Secondary | ICD-10-CM

## 2023-04-23 DIAGNOSIS — K319 Disease of stomach and duodenum, unspecified: Secondary | ICD-10-CM

## 2023-04-23 NOTE — Progress Notes (Signed)
Referring Physician:  Esperanza Richters, PA-C [address] [place],  [place] [address]  Primary Physician:  Esperanza Richters, PA-C  History of Present Illness: 04/25/2023 [name] has a history of dyslipidemia, history of CVA, iron deficiency anemia.   Has been seeing sports medicine for neck. He has 7 month history of intermittent neck pain with radiation to left arm into his fingers. Neck pain is more of a stiffness. He thinks he has some weakness in his left arm. He has numbness and tingling in his arm. No specific alleviating/aggravating factors.   History of left shoulder surgery.   History of bleeding ulcer with NSAIDS. He had some relief with neurontin.   Bowel/Bladder Dysfunction: none  Conservative measures:  Physical therapy: None, as seen chiropractor with initial relief Multimodal medical therapy including regular antiinflammatories: mobic, neurontin Injections: No epidural steroid injections  Past Surgery: No spine surgery  Tyler Obrien has no symptoms of cervical myelopathy.  The symptoms are causing a significant impact on the patient's life.   Review of Systems:  A 10 point review of systems is negative, except for the pertinent positives and negatives detailed in the HPI.  Past Medical History: Past Medical History:  Diagnosis Date   Allergy    Arthritis    Bleeding gastric ulcer    Blood transfusion without reported diagnosis    CVA (cerebral infarction) 2011   Diverticulosis    GERD (gastroesophageal reflux disease)    prilosec as needed- upset stomach mostly - started after gastric bleeding ulcer   Kidney stone    Stroke [facility]) 2011    Past Surgical History: Past Surgical History:  Procedure Laterality Date   APPENDECTOMY     COLONOSCOPY     10 yrs ago    cyst removal from scrotum     cyst removed from elbow     CYSTOSCOPY/URETEROSCOPY/HOLMIUM LASER/STENT PLACEMENT Left 11/20/2022   Procedure: LEFT  URETEROSCOPY/HOLMIUM LASER/STENT PLACEMENT;  Surgeon: Despina Arias, MD;  Location: [facility];  Service: Urology;  Laterality: Left;   FINGER FRACTURE SURGERY Right    5th finger   KIDNEY STONE SURGERY     NASAL SINUS SURGERY  08/2009   OPEN ANTERIOR SHOULDER RECONSTRUCTION     UPPER GASTROINTESTINAL ENDOSCOPY     10 yrs ago    WISDOM TOOTH EXTRACTION      Allergies: Allergies as of 04/25/2023 - Review Complete 04/25/2023  Allergen Reaction Noted   Nsaids Other (See Comments) 11/22/2016   Lidocaine-menthol Rash 12/01/2022    Medications: Outpatient Encounter Medications as of 04/25/2023  Medication Sig   Ascorbic Acid (VITAMIN C) 100 MG tablet Take 100 mg by mouth daily.   aspirin EC 81 MG tablet Take 81 mg by mouth daily.   azelastine (ASTELIN) 0.1 % nasal spray Place 2 sprays into both nostrils 2 (two) times daily as needed for allergies or rhinitis.   b complex vitamins tablet Take 1 tablet by mouth daily.   Cholecalciferol (VITAMIN D3) 75 MCG (3000 UT) TABS Take 3,000 Units by mouth daily.   Coenzyme Q10 (COQ-10 PO) Take 1 capsule by mouth daily. 100 mg   Collagen Hydrolysate POWD Take by mouth daily at 6 (six) AM. Collagen powder   diltiazem (CARDIZEM CD) 120 MG 24 hr capsule Take 1 capsule (120 mg total) by mouth daily.   gabapentin (NEURONTIN) 100 MG capsule Take 1 capsule (100 mg total) by mouth 3 (three) times daily.   guaiFENesin (MUCINEX) 600  MG 12 hr tablet Take 600 mg by mouth 2 (two) times daily as needed for cough or to loosen phlegm.   Magnesium 250 MG TABS Take 250 mg by mouth daily at 6 (six) AM.   Omega-3 Fatty Acids (OMEGA 3 PO) Take 1 capsule by mouth daily.   omeprazole (PRILOSEC) 20 MG capsule Take 20 mg by mouth daily as needed (acid reflux).   [DISCONTINUED] meloxicam (MOBIC) 15 MG tablet Take 1 tablet (15 mg total) by mouth daily.   [DISCONTINUED] metoprolol tartrate (LOPRESSOR) 25 MG tablet Take 1 tablet (25 mg total) by mouth 2 (two) times daily. (Patient  not taking: Reported on 04/04/2023)   No facility-administered encounter medications on file as of 04/25/2023.    Social History: Social History   Tobacco Use   Smoking status: Never   Smokeless tobacco: Former  Building services engineer status: Never Used  Substance Use Topics   Alcohol use: Not Currently   Drug use: No    Family Medical History: Family History  Problem Relation Age of Onset   Cancer Father        Lung CA   Hyperlipidemia Father    Hypertension Father    Lung cancer Father    Cancer Mother    Endometrial cancer Mother    Stomach cancer Maternal Grandfather    Colon cancer Neg Hx    Colon polyps Neg Hx    Esophageal cancer Neg Hx    Rectal cancer Neg Hx     Physical Examination: Vitals:   04/25/23 1003  BP: 110/68    General: Patient is well developed, well nourished, calm, collected, and in no apparent distress. Attention to examination is appropriate.  Respiratory: Patient is breathing without any difficulty.   NEUROLOGICAL:     Awake, alert, oriented to person, place, and time.  Speech is clear and fluent. Fund of knowledge is appropriate.   Cranial Nerves: Pupils equal round and reactive to light.  Facial tone is symmetric.    No posterior cervical tenderness. No tenderness in bilateral trapezial region.   No pain with IR/ER of his shoulders bilaterally.   No abnormal lesions on exposed skin.   Strength: Side Biceps Triceps Deltoid Interossei Grip Wrist Ext. Wrist Flex.  R 5 5 5 5 5 5 5   L 5 5 5 5 5 5 5    Side Iliopsoas Quads Hamstring PF DF EHL  R 5 5 5 5 5 5   L 5 5 5 5 5 5    Reflexes are 2+ and symmetric at the biceps, brachioradialis, patella and achilles.   Hoffman's is absent.  Clonus is not present.   Bilateral upper and lower extremity sensation is intact to light touch.     Gait is normal.     Medical Decision Making  Imaging: MRI of cervical spine dated 03/30/23:  FINDINGS: Alignment: Physiologic.   Vertebrae: No  acute fracture, evidence of discitis, or aggressive bone lesion.   Cord: Normal signal and morphology.   Posterior Fossa, vertebral arteries, paraspinal tissues: Posterior fossa demonstrates no focal abnormality. Vertebral artery flow voids are maintained. Paraspinal soft tissues are unremarkable.   Disc levels:   Discs: Degenerative disease with disc height loss at C3-4, C4-5 and C5-6.   C2-3: No disc protrusion. Mild left foraminal stenosis. No right foraminal stenosis. No spinal stenosis.   C3-4: Mild broad-based disc bulge with a left paracentral disc osteophyte complex. Bilateral uncovertebral degenerative changes, left greater than right. Severe left and moderate  right foraminal stenosis. Mild spinal stenosis.   C4-5: Mild broad-based disc bulge. Bilateral uncovertebral degenerative changes. Severe bilateral foraminal stenosis. Mild spinal stenosis.   C5-6: Mild broad-based disc bulge. Bilateral uncovertebral degenerative changes. Moderate left and severe right foraminal stenosis. Mild spinal stenosis.   C6-7: Mild broad-based disc bulge with a left paracentral disc extrusion. Moderate left foraminal stenosis. Moderate right foraminal stenosis. No spinal stenosis.   C7-T1: No significant disc bulge. No neural foraminal stenosis. No central canal stenosis.   IMPRESSION: 1. At C3-4 there is a mild broad-based disc bulge with a left paracentral disc osteophyte complex. Bilateral uncovertebral degenerative changes, left greater than right. Severe left and moderate right foraminal stenosis. Mild spinal stenosis. 2. At C4-5 there is a mild broad-based disc bulge. Bilateral uncovertebral degenerative changes. Severe bilateral foraminal stenosis. Mild spinal stenosis. 3. At C5-6 there is a mild broad-based disc bulge. Bilateral uncovertebral degenerative changes. Moderate left and severe right foraminal stenosis. Mild spinal stenosis. 4. At C6-7 there is a mild  broad-based disc bulge with a left paracentral disc extrusion. Moderate left foraminal stenosis. Moderate right foraminal stenosis. 5. No acute osseous injury of the cervical spine.     Electronically Signed   By: Elige Ko M.D.   On: 04/06/2023 15:38   Cervical xrays dated 12/09/22:  FINDINGS: The lateral view is diagnostic to the C7-T1 level.   There is no acute fracture or subluxation. Vertebral body heights are preserved.   Unchanged mild reversal of the normal cervical lordosis. No significant listhesis.   Mildly progressive moderate disc height loss and uncovertebral hypertrophy from C3-C4 through C5-C6. Worsening moderate right and unchanged moderate left neuroforaminal stenosis at C4-C5. Unchanged mild bilateral neuroforaminal stenosis at C3-C4.   Normal prevertebral soft tissues.   IMPRESSION: 1. Mildly progressive moderate multilevel cervical spondylosis as described above.     Electronically Signed   By: Obie Dredge M.D.   On: 12/09/2022 18:50  I have personally reviewed the images and agree with the above interpretation.  Assessment and Plan: Mr. Kupec is a pleasant 62 y.o. male has 7 month history of intermittent neck pain with radiation to left arm into his fingers.He has numbness, weakness, and tingling in his arm.   Known cervical spondylosis/DDD C3-C6 with mild central stenosis. He has severe left/moderate right foraminal stenosis C3-C4, severe bilateral foraminal stenosis C4-C5, moderate left/severe right foraminal stenosis C5-C6 and left paracentral disc C6-C7 with moderate bilateral foraminal stenosis.   Neck pain is likely from underling degeneration. Left arm pain likely due from multilevel left foraminal stenosis.   Treatment options discussed with patient and following plan made:   - Order for physical therapy for cervical spine at [facility] in [facility]. Patient to call to schedule appointment.  - Restart neurontin 100mg  q hs and take  regularly. Can go up to 200mg  if needed. Reviewed dosing and side effects. Call with any issues.  - Avoid NSAIDs due to history of bleeding ulcer with motrin.  - Discussed possible cervical injections. He wants to try PT first. We can revisit if no improvement.  - Follow up with me in 6-8 weeks and prn.   I spent a total of 30 minutes in face-to-face and non-face-to-face activities related to this patient's care today including review of outside records, review of imaging, review of symptoms, physical exam, discussion of differential diagnosis, discussion of treatment options, and documentation.   Thank you for involving me in the care of this patient.   Drake Leach  PA-C Dept. of Neurosurgery

## 2023-04-24 NOTE — Telephone Encounter (Signed)
Dr Rhea Belton can you please review this patients concerns.  Dr Meridee Score does not have any sooner appts available.  He would like to know why the urgency in the beginning of the evaluation.

## 2023-04-25 ENCOUNTER — Encounter: Payer: Self-pay | Admitting: Orthopedic Surgery

## 2023-04-25 ENCOUNTER — Ambulatory Visit: Payer: 59 | Admitting: Orthopedic Surgery

## 2023-04-25 ENCOUNTER — Inpatient Hospital Stay: Payer: 59

## 2023-04-25 VITALS — BP 110/68 | Ht 69.0 in | Wt 199.6 lb

## 2023-04-25 VITALS — BP 124/72 | HR 90 | Temp 98.3°F | Resp 18

## 2023-04-25 DIAGNOSIS — M50121 Cervical disc disorder at C4-C5 level with radiculopathy: Secondary | ICD-10-CM | POA: Diagnosis not present

## 2023-04-25 DIAGNOSIS — M47812 Spondylosis without myelopathy or radiculopathy, cervical region: Secondary | ICD-10-CM

## 2023-04-25 DIAGNOSIS — M50122 Cervical disc disorder at C5-C6 level with radiculopathy: Secondary | ICD-10-CM | POA: Diagnosis not present

## 2023-04-25 DIAGNOSIS — M5030 Other cervical disc degeneration, unspecified cervical region: Secondary | ICD-10-CM

## 2023-04-25 DIAGNOSIS — M4802 Spinal stenosis, cervical region: Secondary | ICD-10-CM

## 2023-04-25 DIAGNOSIS — M4722 Other spondylosis with radiculopathy, cervical region: Secondary | ICD-10-CM | POA: Diagnosis not present

## 2023-04-25 DIAGNOSIS — D509 Iron deficiency anemia, unspecified: Secondary | ICD-10-CM | POA: Diagnosis not present

## 2023-04-25 DIAGNOSIS — M5011 Cervical disc disorder with radiculopathy,  high cervical region: Secondary | ICD-10-CM | POA: Diagnosis not present

## 2023-04-25 DIAGNOSIS — M5412 Radiculopathy, cervical region: Secondary | ICD-10-CM

## 2023-04-25 MED ORDER — SODIUM CHLORIDE 0.9 % IV SOLN: Freq: Once | INTRAVENOUS | Status: AC

## 2023-04-25 MED ORDER — GABAPENTIN 100 MG PO CAPS
ORAL_CAPSULE | ORAL | 1 refills | Status: DC
Start: 2023-04-25 — End: 2023-06-24

## 2023-04-25 MED ORDER — SODIUM CHLORIDE 0.9 % IV SOLN
300.0000 mg | Freq: Once | INTRAVENOUS | Status: AC
  Administered 2023-04-25: 300 mg via INTRAVENOUS
  Filled 2023-04-25: qty 300

## 2023-04-25 NOTE — Patient Instructions (Signed)
Iron Sucrose Injection What is this medication? IRON SUCROSE (EYE ern SOO krose) treats low levels of iron (iron deficiency anemia) in people with kidney disease. Iron is a mineral that plays an important role in making red blood cells, which carry oxygen from your lungs to the rest of your body. This medicine may be used for other purposes; ask your health care provider or pharmacist if you have questions. COMMON BRAND NAME(S): Venofer What should I tell my care team before I take this medication? They need to know if you have any of these conditions: Anemia not caused by low iron levels Heart disease High levels of iron in the blood Kidney disease Liver disease An unusual or allergic reaction to iron, other medications, foods, dyes, or preservatives Pregnant or trying to get pregnant Breastfeeding How should I use this medication? This medication is for infusion into a vein. It is given in a hospital or clinic setting. Talk to your care team about the use of this medication in children. While this medication may be prescribed for children as young as 2 years for selected conditions, precautions do apply. Overdosage: If you think you have taken too much of this medicine contact a poison control center or emergency room at once. NOTE: This medicine is only for you. Do not share this medicine with others. What if I miss a dose? Keep appointments for follow-up doses. It is important not to miss your dose. Call your care team if you are unable to keep an appointment. What may interact with this medication? Do not take this medication with any of the following: Deferoxamine Dimercaprol Other iron products This medication may also interact with the following: Chloramphenicol Deferasirox This list may not describe all possible interactions. Give your health care provider a list of all the medicines, herbs, non-prescription drugs, or dietary supplements you use. Also tell them if you smoke,  drink alcohol, or use illegal drugs. Some items may interact with your medicine. What should I watch for while using this medication? Visit your care team regularly. Tell your care team if your symptoms do not start to get better or if they get worse. You may need blood work done while you are taking this medication. You may need to follow a special diet. Talk to your care team. Foods that contain iron include: whole grains/cereals, dried fruits, beans, or peas, leafy green vegetables, and organ meats (liver, kidney). What side effects may I notice from receiving this medication? Side effects that you should report to your care team as soon as possible: Allergic reactions--skin rash, itching, hives, swelling of the face, lips, tongue, or throat Low blood pressure--dizziness, feeling faint or lightheaded, blurry vision Shortness of breath Side effects that usually do not require medical attention (report to your care team if they continue or are bothersome): Flushing Headache Joint pain Muscle pain Nausea Pain, redness, or irritation at injection site This list may not describe all possible side effects. Call your doctor for medical advice about side effects. You may report side effects to FDA at 1-800-FDA-1088. Where should I keep my medication? This medication is given in a hospital or clinic. It will not be stored at home. NOTE: This sheet is a summary. It may not cover all possible information. If you have questions about this medicine, talk to your doctor, pharmacist, or health care provider.  2024 Elsevier/Gold Standard (2023-02-28 00:00:00)

## 2023-04-25 NOTE — Patient Instructions (Addendum)
It was so nice to see you today. Thank you so much for coming in.    You have some wear and tear in your neck (arthritis) with some some irritation of the nerves on the left at multiple levels. This is likely what is causing your pain.   I sent physical therapy orders to [facility] in [facility]. You can call them at [phone] if you don't hear from them to schedule your visit.   I sent a refill of gabapentin to help with pain, numbness, and tingling. Take at night. Can increase to 2 at night as needed. Most common side effects are feeling sleepy, dizzy, or drunk.   If no improvement with PT, we can consider cervical injections.   I will see you back in 6-8 weeks. Please do not hesitate to call if you have any questions or concerns. You can also message me in MyChart.   Drake Leach PA-C [phone]

## 2023-05-02 ENCOUNTER — Inpatient Hospital Stay: Payer: 59

## 2023-05-02 VITALS — BP 130/84 | HR 83 | Temp 98.0°F | Resp 16

## 2023-05-02 DIAGNOSIS — D509 Iron deficiency anemia, unspecified: Secondary | ICD-10-CM | POA: Diagnosis not present

## 2023-05-02 MED ORDER — SODIUM CHLORIDE 0.9 % IV SOLN
300.0000 mg | Freq: Once | INTRAVENOUS | Status: AC
  Administered 2023-05-02: 300 mg via INTRAVENOUS
  Filled 2023-05-02: qty 300

## 2023-05-02 MED ORDER — SODIUM CHLORIDE 0.9 % IV SOLN: Freq: Once | INTRAVENOUS | Status: AC

## 2023-05-02 NOTE — Patient Instructions (Signed)
Iron Sucrose Injection What is this medication? IRON SUCROSE (EYE ern SOO krose) treats low levels of iron (iron deficiency anemia) in people with kidney disease. Iron is a mineral that plays an important role in making red blood cells, which carry oxygen from your lungs to the rest of your body. This medicine may be used for other purposes; ask your health care provider or pharmacist if you have questions. COMMON BRAND NAME(S): Venofer What should I tell my care team before I take this medication? They need to know if you have any of these conditions: Anemia not caused by low iron levels Heart disease High levels of iron in the blood Kidney disease Liver disease An unusual or allergic reaction to iron, other medications, foods, dyes, or preservatives Pregnant or trying to get pregnant Breastfeeding How should I use this medication? This medication is for infusion into a vein. It is given in a hospital or clinic setting. Talk to your care team about the use of this medication in children. While this medication may be prescribed for children as young as 2 years for selected conditions, precautions do apply. Overdosage: If you think you have taken too much of this medicine contact a poison control center or emergency room at once. NOTE: This medicine is only for you. Do not share this medicine with others. What if I miss a dose? Keep appointments for follow-up doses. It is important not to miss your dose. Call your care team if you are unable to keep an appointment. What may interact with this medication? Do not take this medication with any of the following: Deferoxamine Dimercaprol Other iron products This medication may also interact with the following: Chloramphenicol Deferasirox This list may not describe all possible interactions. Give your health care provider a list of all the medicines, herbs, non-prescription drugs, or dietary supplements you use. Also tell them if you smoke,  drink alcohol, or use illegal drugs. Some items may interact with your medicine. What should I watch for while using this medication? Visit your care team regularly. Tell your care team if your symptoms do not start to get better or if they get worse. You may need blood work done while you are taking this medication. You may need to follow a special diet. Talk to your care team. Foods that contain iron include: whole grains/cereals, dried fruits, beans, or peas, leafy green vegetables, and organ meats (liver, kidney). What side effects may I notice from receiving this medication? Side effects that you should report to your care team as soon as possible: Allergic reactions--skin rash, itching, hives, swelling of the face, lips, tongue, or throat Low blood pressure--dizziness, feeling faint or lightheaded, blurry vision Shortness of breath Side effects that usually do not require medical attention (report to your care team if they continue or are bothersome): Flushing Headache Joint pain Muscle pain Nausea Pain, redness, or irritation at injection site This list may not describe all possible side effects. Call your doctor for medical advice about side effects. You may report side effects to FDA at 1-800-FDA-1088. Where should I keep my medication? This medication is given in a hospital or clinic. It will not be stored at home. NOTE: This sheet is a summary. It may not cover all possible information. If you have questions about this medicine, talk to your doctor, pharmacist, or health care provider.  2024 Elsevier/Gold Standard (2023-02-28 00:00:00)

## 2023-05-13 ENCOUNTER — Ambulatory Visit: Payer: 59 | Attending: Cardiology | Admitting: Cardiology

## 2023-05-13 VITALS — BP 116/80 | HR 106 | Ht 69.0 in | Wt 200.0 lb

## 2023-05-13 DIAGNOSIS — R002 Palpitations: Secondary | ICD-10-CM

## 2023-05-13 DIAGNOSIS — I2510 Atherosclerotic heart disease of native coronary artery without angina pectoris: Secondary | ICD-10-CM

## 2023-05-13 DIAGNOSIS — E785 Hyperlipidemia, unspecified: Secondary | ICD-10-CM | POA: Diagnosis not present

## 2023-05-13 MED ORDER — CARVEDILOL 3.125 MG PO TABS: 3.1250 mg | ORAL_TABLET | Freq: Two times a day (BID) | ORAL | 3 refills | Status: DC

## 2023-05-13 NOTE — Progress Notes (Signed)
Cardiology Office Note:    Date:  05/13/2023   ID:  Tyler Obrien, DOB 06/16/1961, MRN [id number]  PCP:  Tyler Richters, PA-C  Cardiologist:  Tyler Balsam, MD    Referring MD: Tyler Obrien   No chief complaint on file.   History of Present Illness:    Tyler Obrien is a 62 y.o. male with past medical history significant for CVA which was more than 10 years ago he did have temporal stroke quite extensive she has been performed at the time showed no explanation for it.  He does have dyslipidemia with LDL of 164, he came to me because sensation of tachycardia and palpitations monitor was placed he did have few episode of supraventricular tachycardia as well as episode of ventricular tachycardia, echocardiogram performed showed preserved ejection fraction, calcium score has been done which was 4.58, coronary CT angio showed minimal disease between 0 and 24%.  He comes today to talk about this.  Still complaining of some palpitations.  We tried beta-blocker metoprolol he did not tolerate this, after that he tried diltiazem also had difficulty drinking it.  Tried to be [place] active but describes fatigue tiredness and just simply feeling weak  Past Medical History:  Diagnosis Date   Allergy    Arthritis    Bleeding gastric ulcer    Blood transfusion without reported diagnosis    CVA (cerebral infarction) 2011   Diverticulosis    GERD (gastroesophageal reflux disease)    prilosec as needed- upset stomach mostly - started after gastric bleeding ulcer   Kidney stone    Stroke [facility]) 2011    Past Surgical History:  Procedure Laterality Date   APPENDECTOMY     COLONOSCOPY     10 yrs ago    cyst removal from scrotum     cyst removed from elbow     CYSTOSCOPY/URETEROSCOPY/HOLMIUM LASER/STENT PLACEMENT Left 11/20/2022   Procedure: LEFT URETEROSCOPY/HOLMIUM LASER/STENT PLACEMENT;  Surgeon: Despina Arias, MD;  Location: [facility];  Service: Urology;  Laterality: Left;   FINGER  FRACTURE SURGERY Right    5th finger   KIDNEY STONE SURGERY     NASAL SINUS SURGERY  08/2009   OPEN ANTERIOR SHOULDER RECONSTRUCTION     UPPER GASTROINTESTINAL ENDOSCOPY     10 yrs ago    WISDOM TOOTH EXTRACTION      Current Medications: Current Meds  Medication Sig   Ascorbic Acid (VITAMIN C) 100 MG tablet Take 100 mg by mouth daily.   aspirin EC 81 MG tablet Take 81 mg by mouth daily.   azelastine (ASTELIN) 0.1 % nasal spray Place 2 sprays into both nostrils 2 (two) times daily as needed for allergies or rhinitis.   b complex vitamins tablet Take 1 tablet by mouth daily.   Cholecalciferol (VITAMIN D3) 75 MCG (3000 UT) TABS Take 3,000 Units by mouth daily.   Coenzyme Q10 (COQ-10 PO) Take 1 capsule by mouth daily. 100 mg   Collagen Hydrolysate POWD Take 1 tablet by mouth daily at 6 (six) AM. Collagen powder   gabapentin (NEURONTIN) 100 MG capsule 1 po q hs. Can increase to 2 po q hs as needed. (Patient taking differently: Take 100 mg by mouth See admin instructions. 1 po q hs. Can increase to 2 po q hs as needed.)   guaiFENesin (MUCINEX) 600 MG 12 hr tablet Take 600 mg by mouth 2 (two) times daily as needed for cough or to loosen phlegm.   Magnesium 250 MG TABS  Take 250 mg by mouth daily at 6 (six) AM.   Omega-3 Fatty Acids (OMEGA 3 PO) Take 1 capsule by mouth daily.   omeprazole (PRILOSEC) 20 MG capsule Take 20 mg by mouth daily as needed (acid reflux).   [DISCONTINUED] diltiazem (CARDIZEM CD) 120 MG 24 hr capsule Take 1 capsule (120 mg total) by mouth daily.     Allergies:   Nsaids and Lidocaine-menthol   Social History   Socioeconomic History   Marital status: Married    Spouse name: Not on file   Number of children: Not on file   Years of education: Not on file   Highest education level: Bachelor's degree (e.g., BA, AB, BS)  Occupational History   Not on file  Tobacco Use   Smoking status: Never   Smokeless tobacco: Former  Building services engineer status: Never Used   Substance and Sexual Activity   Alcohol use: Not Currently   Drug use: No   Sexual activity: Not on file  Other Topics Concern   Not on file  Social History Narrative   Not on file   Social Determinants of Health   Financial Resource Strain: Low Risk  (01/20/2023)   Overall Financial Resource Strain (CARDIA)    Difficulty of Paying Living Expenses: Not hard at all  Food Insecurity: No Food Insecurity (04/04/2023)   Hunger Vital Sign    Worried About Running Out of Food in the Last Year: Never true    Ran Out of Food in the Last Year: Never true  Transportation Needs: No Transportation Needs (01/20/2023)   PRAPARE - Administrator, Civil Service (Medical): No    Lack of Transportation (Non-Medical): No  Physical Activity: Insufficiently Active (01/20/2023)   Exercise Vital Sign    Days of Exercise per Week: 1 day    Minutes of Exercise per Session: 20 min  Stress: Stress Concern Present (01/20/2023)   [employer] of Occupational Health - Occupational Stress Questionnaire    Feeling of Stress : To some extent  Social Connections: Socially Integrated (01/20/2023)   Social Connection and Isolation Panel [NHANES]    Frequency of Communication with Friends and Family: More than three times a week    Frequency of Social Gatherings with Friends and Family: Twice a week    Attends Religious Services: More than 4 times per year    Active Member of [employer] or Organizations: Yes    Attends Engineer, structural: More than 4 times per year    Marital Status: Married     Family History: The patient's family history includes Cancer in his father and mother; Endometrial cancer in his mother; Hyperlipidemia in his father; Hypertension in his father; Lung cancer in his father; Stomach cancer in his maternal grandfather. There is no history of Colon cancer, Colon polyps, Esophageal cancer, or Rectal cancer. ROS:   Please see the history of present illness.    All 14 point  review of systems negative except as described per history of present illness  EKGs/Labs/Other Studies Reviewed:         Recent Labs: 01/06/2023: ALT 22; TSH 1.24 02/25/2023: BUN 18; Creatinine, Ser 1.03; Potassium 4.5; Sodium 141 03/17/2023: Hemoglobin 14.8; Platelets 190.0  Recent Lipid Panel    Component Value Date/Time   CHOL 238 (H) 01/06/2023 0749   TRIG 122.0 01/06/2023 0749   HDL 48.90 01/06/2023 0749   CHOLHDL 5 01/06/2023 0749   VLDL 24.4 01/06/2023 0749   LDLCALC 164 (  H) 01/06/2023 0749   LDLCALC 193 (H) 09/10/2018 0844    Physical Exam:    VS:  BP 116/80 (BP Location: Left Arm, Patient Position: Sitting)   Pulse (!) 106   Ht 5\' 9"  (1.753 m)   Wt 200 lb (90.7 kg)   SpO2 93%   BMI 29.53 kg/m     Wt Readings from Last 3 Encounters:  05/13/23 200 lb (90.7 kg)  04/25/23 199 lb 9.6 oz (90.5 kg)  04/07/23 197 lb (89.4 kg)     GEN:  Well nourished, well developed in no acute distress HEENT: Normal NECK: No JVD; No carotid bruits LYMPHATICS: No lymphadenopathy CARDIAC: RRR, no murmurs, no rubs, no gallops RESPIRATORY:  Clear to auscultation without rales, wheezing or rhonchi  ABDOMEN: Soft, non-tender, non-distended MUSCULOSKELETAL:  No edema; No deformity  SKIN: Warm and dry LOWER EXTREMITIES: no swelling NEUROLOGIC:  Alert and oriented x 3 PSYCHIATRIC:  Normal affect   ASSESSMENT:    1. Coronary artery disease involving native coronary artery of native heart without angina pectoris   2. Palpitations   3. Dyslipidemia    PLAN:    In order of problems listed above:  Coronary artery disease nonobstructive I did calculate his 10 years predicted risk which is 5.2% without calcium scoring was 9.4%.  But the fact that he had stopped before indicated he need to be on cholesterol medication, he does not want to do that he does not want to take cholesterol medication because he hurts somewhat but think about this.  I tried to convince him but he is unconvinced.   He said that he will try to do diet and exercise which will let him do for about 5 months. Palpitations I will try to put him on carvedilol 3.125 twice daily to see if we can suppress this arrhythmia however what ever I seen on the monitor is not critical.  He is ventricular tachycardia low risk because of nonobstructive coronary disease and preserved ejection fraction. Dyslipidemia Long discussion above.  He does not want to take any medication with diet and exercise for now   Medication Adjustments/Labs and Tests Ordered: Current medicines are reviewed at length with the patient today.  Concerns regarding medicines are outlined above.  No orders of the defined types were placed in this encounter.  Medication changes: No orders of the defined types were placed in this encounter.   Signed, Georgeanna Lea, MD, [facility] 05/13/2023 10:50 AM    [facility]

## 2023-05-13 NOTE — Patient Instructions (Signed)
Medication Instructions:   START: Carvedilol 3.125 1 tablet twice daily   Lab Work: None Ordered If you have labs (blood work) drawn today and your tests are completely normal, you will receive your results only by: MyChart Message (if you have MyChart) OR A paper copy in the mail If you have any lab test that is abnormal or we need to change your treatment, we will call you to review the results.   Testing/Procedures: None Ordered   Follow-Up: At [facility], you and your health needs are our priority.  As part of our continuing mission to provide you with exceptional heart care, we have created designated Provider Care Teams.  These Care Teams include your primary Cardiologist (physician) and Advanced Practice Providers (APPs -  Physician Assistants and Nurse Practitioners) who all work together to provide you with the care you need, when you need it.  We recommend signing up for the patient portal called "MyChart".  Sign up information is provided on this After Visit Summary.  MyChart is used to connect with patients for Virtual Visits (Telemedicine).  Patients are able to view lab/test results, encounter notes, upcoming appointments, etc.  Non-urgent messages can be sent to your provider as well.   To learn more about what you can do with MyChart, go to [email or web address].    Your next appointment:   5 month(s)  The format for your next appointment:   In Person  Provider:   Gypsy Balsam, MD    Other Instructions NA

## 2023-05-13 NOTE — Addendum Note (Signed)
Addended by: Baldo Ash D on: 05/13/2023 10:56 AM   Modules accepted: Orders

## 2023-05-15 ENCOUNTER — Ambulatory Visit: Payer: 59

## 2023-05-15 ENCOUNTER — Ambulatory Visit
Admission: RE | Admit: 2023-05-15 | Discharge: 2023-05-15 | Disposition: A | Discharge status: Home / Self Care | Payer: 59 | Source: Ambulatory Visit | Attending: Family Medicine | Admitting: Family Medicine

## 2023-05-15 VITALS — BP 134/92 | HR 109 | Temp 98.2°F | Resp 18 | Ht 69.0 in | Wt 200.0 lb

## 2023-05-15 DIAGNOSIS — T7840XA Allergy, unspecified, initial encounter: Secondary | ICD-10-CM

## 2023-05-15 DIAGNOSIS — H6993 Unspecified Eustachian tube disorder, bilateral: Secondary | ICD-10-CM | POA: Diagnosis not present

## 2023-05-15 MED ORDER — FLUTICASONE PROPIONATE 50 MCG/ACT NA SUSP: 2.0000 | Freq: Every day | NASAL | 0 refills | Status: DC

## 2023-05-15 NOTE — ED Provider Notes (Signed)
Tyler Obrien [facility]    CSN: [id number] Arrival date & time: 05/15/23  1028      History   Chief Complaint Chief Complaint  Patient presents with   Ear Fullness    Sinuses - Entered by patient    HPI Tyler Obrien is a 62 y.o. male.   Patient states he flew to [place].  Upon landing he felt pressure and discomfort in his ears.  He also has some sinus congestion and minor postnasal drip.  He has had some neck stiffness and headache.  Thinks he may have muscle tension.  No fevers or chills.  No nausea vomiting.  No sore throat or cough.  He does have underlying allergies.  He has been under the care of an ENT in the past for recurring infections    Past Medical History:  Diagnosis Date   Allergy    Arthritis    Bleeding gastric ulcer    Blood transfusion without reported diagnosis    CVA (cerebral infarction) 2011   Diverticulosis    GERD (gastroesophageal reflux disease)    prilosec as needed- upset stomach mostly - started after gastric bleeding ulcer   Kidney stone    Stroke [facility]) 2011    Patient Active Problem List   Diagnosis Date Noted   Coronary artery disease minimal disease based on coronary scheduled from 2024 05/13/2023   IDA (iron deficiency anemia) 04/04/2023   Palpitations 02/17/2023   Atypical chest pain 02/17/2023   Dyspnea on exertion 02/17/2023   Dyslipidemia 02/17/2023   Late effect of cerebrovascular accident (CVA) 02/17/2023   DDD (degenerative disc disease), cervical 03/24/2019   Right shoulder pain 02/24/2019   Cyst of scrotum 08/27/2016   Bladder pain 08/27/2016   Microcytosis 08/27/2016   Vitamin D deficiency 08/27/2016   Ethmoid sinusitis 08/27/2016   Kidney stone 08/21/2015    Past Surgical History:  Procedure Laterality Date   APPENDECTOMY     COLONOSCOPY     10 yrs ago    cyst removal from scrotum     cyst removed from elbow     CYSTOSCOPY/URETEROSCOPY/HOLMIUM LASER/STENT PLACEMENT Left 11/20/2022   Procedure: LEFT  URETEROSCOPY/HOLMIUM LASER/STENT PLACEMENT;  Surgeon: Despina Arias, MD;  Location: [facility];  Service: Urology;  Laterality: Left;   FINGER FRACTURE SURGERY Right    5th finger   KIDNEY STONE SURGERY     NASAL SINUS SURGERY  08/2009   OPEN ANTERIOR SHOULDER RECONSTRUCTION     UPPER GASTROINTESTINAL ENDOSCOPY     10 yrs ago    WISDOM TOOTH EXTRACTION         Home Medications    Prior to Admission medications   Medication Sig Start Date End Date Taking? Authorizing Provider  Ascorbic Acid (VITAMIN C) 100 MG tablet Take 100 mg by mouth daily.   Yes [provider]  aspirin EC 81 MG tablet Take 81 mg by mouth daily.   Yes [provider]  azelastine (ASTELIN) 0.1 % nasal spray Place 2 sprays into both nostrils 2 (two) times daily as needed for allergies or rhinitis. 07/20/22  Yes [provider]  b complex vitamins tablet Take 1 tablet by mouth daily.   Yes [provider]  Cholecalciferol (VITAMIN D3) 75 MCG (3000 UT) TABS Take 3,000 Units by mouth daily.   Yes [provider]  Coenzyme Q10 (COQ-10 PO) Take 1 capsule by mouth daily. 100 mg   Yes [provider]  Collagen Hydrolysate POWD Take 1  tablet by mouth daily at 6 (six) AM. Collagen powder   Yes [provider]  fluticasone (FLONASE) 50 MCG/ACT nasal spray Place 2 sprays into both nostrils daily. 05/15/23  Yes Eustace Moore, MD  gabapentin (NEURONTIN) 100 MG capsule 1 po q hs. Can increase to 2 po q hs as needed. Patient taking differently: Take 100 mg by mouth See admin instructions. 1 po q hs. Can increase to 2 po q hs as needed. 04/25/23  Yes Drake Leach, PA-C  guaiFENesin (MUCINEX) 600 MG 12 hr tablet Take 600 mg by mouth 2 (two) times daily as needed for cough or to loosen phlegm.   Yes [provider]  Magnesium 250 MG TABS Take 250 mg by mouth daily at 6 (six) AM.   Yes [provider]  Omega-3 Fatty Acids (OMEGA 3 PO) Take 1 capsule by mouth  daily.   Yes [provider]  omeprazole (PRILOSEC) 20 MG capsule Take 20 mg by mouth daily as needed (acid reflux).   Yes [provider]  carvedilol (COREG) 3.125 MG tablet Take 1 tablet (3.125 mg total) by mouth 2 (two) times daily. 05/13/23 08/11/23  Georgeanna Lea, MD    Family History Family History  Problem Relation Age of Onset   Cancer Mother    Endometrial cancer Mother    Cancer Father        Lung CA   Hyperlipidemia Father    Hypertension Father    Lung cancer Father    Stomach cancer Maternal Grandfather    Colon cancer Neg Hx    Colon polyps Neg Hx    Esophageal cancer Neg Hx    Rectal cancer Neg Hx     Social History Social History   Tobacco Use   Smoking status: Never   Smokeless tobacco: Former  Building services engineer status: Never Used  Substance Use Topics   Alcohol use: Not Currently   Drug use: No     Allergies   Nsaids, Lidocaine-menthol, and Prednisone   Review of Systems Review of Systems See HPI  Physical Exam Triage Vital Signs ED Triage Vitals  Encounter Vitals Group     BP 05/15/23 1039 (!) 134/92     Systolic BP Percentile --      Diastolic BP Percentile --      Pulse Rate 05/15/23 1039 (!) 109     Resp 05/15/23 1039 18     Temp 05/15/23 1039 98.2 F (36.8 C)     Temp Source 05/15/23 1039 Oral     SpO2 05/15/23 1039 95 %     Weight 05/15/23 1041 200 lb (90.7 kg)     Height 05/15/23 1041 5\' 9"  (1.753 m)     Head Circumference --      Peak Flow --      Pain Score 05/15/23 1040 4     Pain Loc --      Pain Education --      Exclude from Growth Chart --    No data found.  Updated Vital Signs BP (!) 134/92 (BP Location: Right Arm)   Pulse (!) 109   Temp 98.2 F (36.8 C) (Oral)   Resp 18   Ht 5\' 9"  (1.753 m)   Wt 90.7 kg   SpO2 95%   BMI 29.53 kg/m       Physical Exam Constitutional:      General: He is not in acute distress.    Appearance: He is  well-developed.     Comments: Somewhat anxious  about health  HENT:     Head: Normocephalic and atraumatic.     Right Ear: Ear canal normal.     Left Ear: Ear canal and external ear normal.     Ears:     Comments: Both TMs dull and retracted    Nose: Congestion present.     Comments: Clear rhinorrhea, deviated septum, nasal membranes swollen    Mouth/Throat:     Mouth: Mucous membranes are moist.     Pharynx: Posterior oropharyngeal erythema present.     Comments: Lymphoid hyperplasia post pharynx Eyes:     Conjunctiva/sclera: Conjunctivae normal.     Pupils: Pupils are equal, round, and reactive to light.  Cardiovascular:     Rate and Rhythm: Normal rate.  Pulmonary:     Effort: Pulmonary effort is normal. No respiratory distress.  Abdominal:     General: There is no distension.     Palpations: Abdomen is soft.  Musculoskeletal:        General: Normal range of motion.     Cervical back: Normal range of motion.  Skin:    General: Skin is warm and dry.  Neurological:     General: No focal deficit present.     Mental Status: He is alert.      UC Treatments / Results  Labs (all labs ordered are listed, but only abnormal results are displayed) Labs Reviewed - No data to display  EKG   Radiology No results found.  Procedures Procedures (including critical care time)  Medications Ordered in UC Medications - No data to display  Initial Impression / Assessment and Plan / UC Course  I have reviewed the triage vital signs and the nursing notes.  Pertinent labs & imaging results that were available during my care of the patient were reviewed by me and considered in my medical decision making (see chart for details).     Final Clinical Impressions(s) / UC Diagnoses   Final diagnoses:  Eustachian tube dysfunction, bilateral  Allergy, initial encounter     Discharge Instructions      Drink lots of water Use the flonase as directed Call for problems    ED Prescriptions     Medication Sig Dispense  Auth. Provider   fluticasone (FLONASE) 50 MCG/ACT nasal spray Place 2 sprays into both nostrils daily. 16 g Eustace Moore, MD      PDMP not reviewed this encounter.   Eustace Moore, MD 05/15/23 [phone]

## 2023-05-15 NOTE — Discharge Instructions (Addendum)
Drink lots of water Use the flonase as directed Call for problems

## 2023-05-15 NOTE — ED Triage Notes (Signed)
Patient c/o bilateral ear fullness, sinus pressure and pain, denies nasal drainage and fever x 1 week.  Patient has taken Pataday and Tylenol.

## 2023-05-16 NOTE — Telephone Encounter (Signed)
Referral was entered see below.     Patient Demographics for [name], [name]" [[id number]]  Birth date: 1961-07-29 SSN: [id number]  Age: 62 yrs Sex: Male  Home phone: [phone] Work phone: [phone]  Address: [address] [place] [address] E-mail: [email or web address] *  Permanent comments: * [facility] DPR SIGNED ON 09/20/19. OK TO SPEAK TO Coralee Pesa  Referral Information [[id number]]  Patient: [name], [name]" [[id number]] MRN: [id number]  Status: Authorized Type: Consultation  Class: Internal Reasons: Specialty Services Required [5]  Diagnosis: R93.3 (ICD-10-CM) - Abnormal CT scan, stomach K31.9 (ICD-10-CM) - Gastric lesion Procedures: REF25 - AMB REFERRAL TO GASTROENTEROLOGY  Start: Apr 23, 2023 Expiration: Apr 22, 2024  Requested: 1 Authorized: 1  Scheduled: 1 Completed:    Authorization #:   Precertification #:    Referring Location: [facility] Referred to Location:    Referring Department: [facility] Referred To Department: [facility]  Referring Provider: Lemar Lofty Referred To Provider: Lemar Lofty  Coverages Used  Pt Covered? Payor Plan Medical illustrator # Authorization Comments    Covered Patent examiner HEALTHCARE OTHER        Service Level Auths Information  Payor/Plan Info Code Auth # From Dt To Dt # Left # Appr

## 2023-05-16 NOTE — Telephone Encounter (Signed)
Patient can go ahead and come in next week for his CBC and iron studies Try him on some low-dose ondansetron ODT 4 mg every 6-8 hours as needed for nausea symptom I am also copying to ensure prior authorization is done for upcoming endoscopy/EUS in the outpatient hospital setting

## 2023-05-20 ENCOUNTER — Encounter (HOSPITAL_COMMUNITY): Payer: Self-pay | Admitting: Gastroenterology

## 2023-05-21 ENCOUNTER — Other Ambulatory Visit (INDEPENDENT_AMBULATORY_CARE_PROVIDER_SITE_OTHER): Payer: 59

## 2023-05-21 DIAGNOSIS — E611 Iron deficiency: Secondary | ICD-10-CM

## 2023-05-21 DIAGNOSIS — R933 Abnormal findings on diagnostic imaging of other parts of digestive tract: Secondary | ICD-10-CM

## 2023-05-21 LAB — CBC WITH DIFFERENTIAL/PLATELET
Basophils Absolute: 0 10*3/uL (ref 0.0–0.1)
Basophils Relative: 0.6 % (ref 0.0–3.0)
Eosinophils Absolute: 0.3 10*3/uL (ref 0.0–0.7)
Eosinophils Relative: 4.5 % (ref 0.0–5.0)
HCT: 48.6 % (ref 39.0–52.0)
Hemoglobin: 15.8 g/dL (ref 13.0–17.0)
Lymphocytes Relative: 33.5 % (ref 12.0–46.0)
Lymphs Abs: 2.3 10*3/uL (ref 0.7–4.0)
MCHC: 32.6 g/dL (ref 30.0–36.0)
MCV: 85.4 fl (ref 78.0–100.0)
Monocytes Absolute: 0.7 10*3/uL (ref 0.1–1.0)
Monocytes Relative: 10.3 % (ref 3.0–12.0)
Neutro Abs: 3.5 10*3/uL (ref 1.4–7.7)
Neutrophils Relative %: 51.1 % (ref 43.0–77.0)
Platelets: 164 10*3/uL (ref 150.0–400.0)
RBC: 5.69 Mil/uL (ref 4.22–5.81)
RDW: 18.1 % — ABNORMAL HIGH (ref 11.5–15.5)
WBC: 6.9 10*3/uL (ref 4.0–10.5)

## 2023-05-21 LAB — IBC + FERRITIN
Ferritin: 213.8 ng/mL (ref 22.0–322.0)
Iron: 107 ug/dL (ref 42–165)
Saturation Ratios: 39 % (ref 20.0–50.0)
TIBC: 274.4 ug/dL (ref 250.0–450.0)
Transferrin: 196 mg/dL — ABNORMAL LOW (ref 212.0–360.0)

## 2023-05-21 LAB — IRON: Iron: 107 ug/dL (ref 42–165)

## 2023-05-21 MED ORDER — ONDANSETRON 4 MG PO TBDP: ORAL_TABLET | ORAL | 0 refills | Status: DC

## 2023-05-21 NOTE — Addendum Note (Signed)
Addended by: Richardson Chiquito on: 05/21/2023 08:48 AM   Modules accepted: Orders

## 2023-05-21 NOTE — Progress Notes (Signed)
Attempted to obtain medical history via telephone, unable to reach at this time. HIPAA compliant voicemail message left requesting return call to pre surgical testing department. 

## 2023-05-28 ENCOUNTER — Other Ambulatory Visit: Payer: Self-pay

## 2023-05-28 DIAGNOSIS — K319 Disease of stomach and duodenum, unspecified: Secondary | ICD-10-CM

## 2023-05-29 ENCOUNTER — Ambulatory Visit (HOSPITAL_COMMUNITY)
Admission: RE | Admit: 2023-05-29 | Discharge: 2023-05-29 | Disposition: A | Discharge status: Home / Self Care | Payer: 59 | Attending: Gastroenterology | Admitting: Gastroenterology

## 2023-05-29 ENCOUNTER — Ambulatory Visit (HOSPITAL_BASED_OUTPATIENT_CLINIC_OR_DEPARTMENT_OTHER): Payer: 59 | Admitting: Certified Registered"

## 2023-05-29 ENCOUNTER — Ambulatory Visit (HOSPITAL_COMMUNITY): Payer: Self-pay | Admitting: Certified Registered"

## 2023-05-29 ENCOUNTER — Encounter (HOSPITAL_COMMUNITY): Payer: Self-pay | Admitting: Gastroenterology

## 2023-05-29 ENCOUNTER — Encounter (HOSPITAL_COMMUNITY)
Admission: RE | Disposition: A | Discharge status: Home / Self Care | Payer: Self-pay | Source: Home / Self Care | Attending: Gastroenterology

## 2023-05-29 ENCOUNTER — Other Ambulatory Visit: Payer: Self-pay

## 2023-05-29 DIAGNOSIS — R933 Abnormal findings on diagnostic imaging of other parts of digestive tract: Secondary | ICD-10-CM | POA: Diagnosis present

## 2023-05-29 DIAGNOSIS — Z8049 Family history of malignant neoplasm of other genital organs: Secondary | ICD-10-CM | POA: Insufficient documentation

## 2023-05-29 DIAGNOSIS — Z87891 Personal history of nicotine dependence: Secondary | ICD-10-CM | POA: Insufficient documentation

## 2023-05-29 DIAGNOSIS — Z801 Family history of malignant neoplasm of trachea, bronchus and lung: Secondary | ICD-10-CM | POA: Diagnosis not present

## 2023-05-29 DIAGNOSIS — K3189 Other diseases of stomach and duodenum: Secondary | ICD-10-CM

## 2023-05-29 DIAGNOSIS — C49A2 Gastrointestinal stromal tumor of stomach: Secondary | ICD-10-CM | POA: Diagnosis not present

## 2023-05-29 DIAGNOSIS — K2289 Other specified disease of esophagus: Secondary | ICD-10-CM

## 2023-05-29 DIAGNOSIS — Z800 Family history of malignant neoplasm of digestive organs: Secondary | ICD-10-CM | POA: Diagnosis not present

## 2023-05-29 DIAGNOSIS — I899 Noninfective disorder of lymphatic vessels and lymph nodes, unspecified: Secondary | ICD-10-CM

## 2023-05-29 DIAGNOSIS — K449 Diaphragmatic hernia without obstruction or gangrene: Secondary | ICD-10-CM

## 2023-05-29 DIAGNOSIS — K319 Disease of stomach and duodenum, unspecified: Secondary | ICD-10-CM

## 2023-05-29 HISTORY — PX: BIOPSY: SHX5522

## 2023-05-29 HISTORY — PX: EUS: SHX5427

## 2023-05-29 HISTORY — PX: FINE NEEDLE ASPIRATION: SHX5430

## 2023-05-29 HISTORY — PX: ESOPHAGOGASTRODUODENOSCOPY: SHX5428

## 2023-05-29 SURGERY — UPPER ENDOSCOPIC ULTRASOUND (EUS) RADIAL
Anesthesia: Monitor Anesthesia Care

## 2023-05-29 MED ORDER — PROPOFOL 500 MG/50ML IV EMUL
INTRAVENOUS | Status: DC | PRN
  Administered 2023-05-29: 150 ug/kg/min via INTRAVENOUS

## 2023-05-29 MED ORDER — CIPROFLOXACIN IN D5W 400 MG/200ML IV SOLN
INTRAVENOUS | Status: AC
  Filled 2023-05-29: qty 200

## 2023-05-29 MED ORDER — PROPOFOL 1000 MG/100ML IV EMUL
INTRAVENOUS | Status: AC
  Filled 2023-05-29: qty 100

## 2023-05-29 MED ORDER — LACTATED RINGERS IV SOLN: INTRAVENOUS | Status: DC | PRN

## 2023-05-29 MED ORDER — CIPROFLOXACIN HCL 500 MG PO TABS: 500.0000 mg | ORAL_TABLET | Freq: Two times a day (BID) | ORAL | 0 refills | Status: AC

## 2023-05-29 MED ORDER — SODIUM CHLORIDE 0.9 % IV SOLN: INTRAVENOUS | Status: DC

## 2023-05-29 MED ORDER — CIPROFLOXACIN IN D5W 400 MG/200ML IV SOLN
INTRAVENOUS | Status: DC | PRN
  Administered 2023-05-29: 400 mg via INTRAVENOUS

## 2023-05-29 MED ORDER — PROPOFOL 500 MG/50ML IV EMUL
INTRAVENOUS | Status: AC
  Filled 2023-05-29: qty 50

## 2023-05-29 MED ORDER — LACTATED RINGERS IV SOLN
INTRAVENOUS | Status: AC | PRN
  Administered 2023-05-29: 1000 mL via INTRAVENOUS

## 2023-05-29 MED ORDER — LIDOCAINE 2% (20 MG/ML) 5 ML SYRINGE
INTRAMUSCULAR | Status: DC | PRN
  Administered 2023-05-29: 100 mg via INTRAVENOUS

## 2023-05-29 MED ORDER — PROPOFOL 10 MG/ML IV BOLUS
INTRAVENOUS | Status: DC | PRN
Start: 2023-05-29 — End: 2023-05-29
  Administered 2023-05-29: 30 mg via INTRAVENOUS

## 2023-05-29 NOTE — H&P (Signed)
GASTROENTEROLOGY PROCEDURE H&P NOTE   Primary Care Physician: Esperanza Richters, PA-C  HPI: Tyler Obrien is a 62 y.o. 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  Past Medical History:  Diagnosis Date   Allergy    Arthritis    Bleeding gastric ulcer    Blood transfusion without reported diagnosis    CVA (cerebral infarction) 2011   Diverticulosis    GERD (gastroesophageal reflux disease)    prilosec as needed- upset stomach mostly - started after gastric bleeding ulcer   Kidney stone    Stroke [facility]) 2011   Past Surgical History:  Procedure Laterality Date   APPENDECTOMY     COLONOSCOPY     10 yrs ago    cyst removal from scrotum     cyst removed from elbow     CYSTOSCOPY/URETEROSCOPY/HOLMIUM LASER/STENT PLACEMENT Left 11/20/2022   Procedure: LEFT URETEROSCOPY/HOLMIUM LASER/STENT PLACEMENT;  Surgeon: Despina Arias, MD;  Location: [facility];  Service: Urology;  Laterality: Left;   FINGER FRACTURE SURGERY Right    5th finger   KIDNEY STONE SURGERY     NASAL SINUS SURGERY  08/2009   OPEN ANTERIOR SHOULDER RECONSTRUCTION     UPPER GASTROINTESTINAL ENDOSCOPY     10 yrs ago    WISDOM TOOTH EXTRACTION     Current Facility-Administered Medications  Medication Dose Route Frequency Provider Last Rate Last Admin   0.9 %  sodium chloride infusion   Intravenous Continuous Mansouraty, Netty Starring., MD       0.9 %  sodium chloride infusion   Intravenous Continuous Mansouraty, Netty Starring., MD       lactated ringers infusion    Continuous PRN Mansouraty, Netty Starring., MD 10 mL/hr at 05/29/23 0946 1,000 mL at 05/29/23 0946   Facility-Administered Medications Ordered in Other Encounters  Medication Dose Route Frequency Provider Last Rate Last Admin   lactated ringers infusion   Intravenous Continuous PRN Sindy Guadeloupe, CRNA   New Bag at 05/29/23 [phone]    Current Facility-Administered Medications:    0.9 %  sodium chloride infusion,  , Intravenous, Continuous, Mansouraty, Netty Starring., MD   0.9 %  sodium chloride infusion, , Intravenous, Continuous, Mansouraty, Netty Starring., MD   lactated ringers infusion, , , Continuous PRN, Mansouraty, Netty Starring., MD, Last Rate: 10 mL/hr at 05/29/23 0946, 1,000 mL at 05/29/23 0946  Facility-Administered Medications Ordered in Other Encounters:    lactated ringers infusion, , Intravenous, Continuous PRN, Good, Melanie B, CRNA, New Bag at 05/29/23 8295 Allergies  Allergen Reactions   Nsaids Other (See Comments)    BLEEDING/ULCER   Lidocaine-Menthol Rash   Prednisone Palpitations   Family History  Problem Relation Age of Onset   Cancer Mother    Endometrial cancer Mother    Cancer Father        Lung CA   Hyperlipidemia Father    Hypertension Father    Lung cancer Father    Stomach cancer Maternal Grandfather    Colon cancer Neg Hx    Colon polyps Neg Hx    Esophageal cancer Neg Hx    Rectal cancer Neg Hx    Social History   Socioeconomic History   Marital status: Married    Spouse name: Not on file   Number of children: Not on file   Years of education: Not on file   Highest education level: Bachelor's degree (e.g., BA, AB, BS)  Occupational History   Not  on file  Tobacco Use   Smoking status: Never   Smokeless tobacco: Former  Building services engineer status: Never Used  Substance and Sexual Activity   Alcohol use: Not Currently   Drug use: No   Sexual activity: Not Currently  Other Topics Concern   Not on file  Social History Narrative   Not on file   Social Determinants of Health   Financial Resource Strain: Low Risk  (01/20/2023)   Overall Financial Resource Strain (CARDIA)    Difficulty of Paying Living Expenses: Not hard at all  Food Insecurity: No Food Insecurity (04/04/2023)   Hunger Vital Sign    Worried About Running Out of Food in the Last Year: Never true    Ran Out of Food in the Last Year: Never true  Transportation Needs: No Transportation Needs  (01/20/2023)   PRAPARE - Administrator, Civil Service (Medical): No    Lack of Transportation (Non-Medical): No  Physical Activity: Insufficiently Active (01/20/2023)   Exercise Vital Sign    Days of Exercise per Week: 1 day    Minutes of Exercise per Session: 20 min  Stress: Stress Concern Present (01/20/2023)   [employer] of Occupational Health - Occupational Stress Questionnaire    Feeling of Stress : To some extent  Social Connections: Socially Integrated (01/20/2023)   Social Connection and Isolation Panel [NHANES]    Frequency of Communication with Friends and Family: More than three times a week    Frequency of Social Gatherings with Friends and Family: Twice a week    Attends Religious Services: More than 4 times per year    Active Member of [employer] or Organizations: Yes    Attends Banker Meetings: More than 4 times per year    Marital Status: Married  Catering manager Violence: Not At Risk (04/04/2023)   Humiliation, Afraid, Rape, and Kick questionnaire    Fear of Current or Ex-Partner: No    Emotionally Abused: No    Physically Abused: No    Sexually Abused: No    Physical Exam: Today's Vitals   05/29/23 0944  BP: (!) 139/91  Pulse: 80  Resp: 20  Temp: 97.8 F (36.6 C)  TempSrc: Tympanic  SpO2: 94%  Weight: 90.7 kg  Height: 5\' 9"  (1.753 m)  PainSc: 0-No pain   Body mass index is 29.53 kg/m. GEN: NAD EYE: Sclerae anicteric ENT: MMM CV: Non-tachycardic GI: Soft, NT/ND NEURO:  Alert & Oriented x 3  Lab Results: No results for input(s): "WBC", "HGB", "HCT", "PLT" in the last 72 hours. BMET No results for input(s): "NA", "K", "CL", "CO2", "GLUCOSE", "BUN", "CREATININE", "CALCIUM" in the last 72 hours. LFT No results for input(s): "PROT", "ALBUMIN", "AST", "ALT", "ALKPHOS", "BILITOT", "BILIDIR", "IBILI" in the last 72 hours. PT/INR No results for input(s): "LABPROT", "INR" in the last 72 hours.   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  The risks and benefits of endoscopic evaluation/treatment were discussed with the patient and/or family; these include but are not limited to the risk of perforation, infection, bleeding, missed lesions, lack of diagnosis, severe illness requiring hospitalization, as well as anesthesia and sedation related illnesses.  The patient's history has been reviewed, patient examined, no change in status, and deemed stable for procedure.  The patient and/or family is agreeable to proceed.    Corliss Parish, MD [facility]  Office # [phone]

## 2023-05-29 NOTE — Anesthesia Procedure Notes (Signed)
Procedure Name: MAC Date/Time: 05/29/2023 11:18 AM  Performed by: Sindy Guadeloupe, CRNAPre-anesthesia Checklist: Patient identified, Emergency Drugs available, Suction available, Patient being monitored and Timeout performed Oxygen Delivery Method: Simple face mask Placement Confirmation: positive ETCO2

## 2023-05-29 NOTE — Transfer of Care (Signed)
Immediate Anesthesia Transfer of Care Note  Patient: Tyler Obrien  Procedure(s) Performed: UPPER ENDOSCOPIC ULTRASOUND (EUS) RADIAL ESOPHAGOGASTRODUODENOSCOPY (EGD) BIOPSY FINE NEEDLE ASPIRATION (FNA) LINEAR  Patient Location: PACU and Endoscopy Unit  Anesthesia Type:MAC  Level of Consciousness: drowsy and responds to stimulation  Airway & Oxygen Therapy: Patient Spontanous Breathing and Patient connected to face mask oxygen  Post-op Assessment: Report given to RN and Post -op Vital signs reviewed and stable  Post vital signs: Reviewed and stable  Last Vitals:  Vitals Value Taken Time  BP 113/71 05/29/23 1218  Temp    Pulse 77 05/29/23 1220  Resp 13 05/29/23 1220  SpO2 100 % 05/29/23 1220  Vitals shown include unfiled device data.  Last Pain:  Vitals:   05/29/23 0944  TempSrc: Tympanic  PainSc: 0-No pain         Complications: No notable events documented.

## 2023-05-29 NOTE — Anesthesia Postprocedure Evaluation (Signed)
Anesthesia Post Note  Patient: Tyler Obrien  Procedure(s) Performed: UPPER ENDOSCOPIC ULTRASOUND (EUS) RADIAL ESOPHAGOGASTRODUODENOSCOPY (EGD) BIOPSY FINE NEEDLE ASPIRATION (FNA) LINEAR     Patient location during evaluation: Endoscopy Anesthesia Type: MAC Level of consciousness: awake and alert, patient cooperative and oriented Pain management: pain level controlled Vital Signs Assessment: post-procedure vital signs reviewed and stable Respiratory status: nonlabored ventilation, spontaneous breathing and respiratory function stable Cardiovascular status: blood pressure returned to baseline and stable Postop Assessment: no apparent nausea or vomiting and able to ambulate Anesthetic complications: no   No notable events documented.  Last Vitals:  Vitals:   05/29/23 1224 05/29/23 1229  BP: 103/79 114/82  Pulse: 72 80  Resp: 13 17  Temp:    SpO2: 100% 94%    Last Pain:  Vitals:   05/29/23 1229  TempSrc:   PainSc: 0-No pain                 [name],[name]

## 2023-05-29 NOTE — Discharge Instructions (Signed)
 YOU HAD AN ENDOSCOPIC PROCEDURE TODAY: Refer to the procedure report and other information in the discharge instructions given to you for any specific questions about what was found during the examination. If this information does not answer your questions, please call [place] office at [phone] to clarify.   YOU SHOULD EXPECT: Some feelings of bloating in the abdomen. Passage of more gas than usual. Walking can help get rid of the air that was put into your GI tract during the procedure and reduce the bloating. If you had a lower endoscopy (such as a colonoscopy or flexible sigmoidoscopy) you may notice spotting of blood in your stool or on the toilet paper. Some abdominal soreness may be present for a day or two, also.  DIET: Your first meal following the procedure should be a light meal and then it is ok to progress to your normal diet. A half-sandwich or bowl of soup is an example of a good first meal. Heavy or fried foods are harder to digest and may make you feel nauseous or bloated. Drink plenty of fluids but you should avoid alcoholic beverages for 24 hours. If you had a esophageal dilation, please see attached instructions for diet.    ACTIVITY: Your care partner should take you home directly after the procedure. You should plan to take it easy, moving slowly for the rest of the day. You can resume normal activity the day after the procedure however YOU SHOULD NOT DRIVE, use power tools, machinery or perform tasks that involve climbing or major physical exertion for 24 hours (because of the sedation medicines used during the test).   SYMPTOMS TO REPORT IMMEDIATELY: A gastroenterologist can be reached at any hour. Please call [phone]  for any of the following symptoms:  Following upper endoscopy (EGD, EUS) Vomiting of blood or coffee ground material  New, significant abdominal pain  New, significant chest pain or pain under the shoulder blades  Painful or persistently difficult  swallowing  New shortness of breath  Black, tarry-looking or red, bloody stools  FOLLOW UP:  If any biopsies were taken you will be contacted by phone or by letter within the next 1-3 weeks. Call [phone]  if you have not heard about the biopsies in 3 weeks.  Please also call with any specific questions about appointments or follow up tests.

## 2023-05-29 NOTE — Op Note (Addendum)
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 Pyrtle, MD Medicines:                Monitored Anesthesia Care, Cipro 400 mg IV Complications:            No immediate complications. Estimated Blood Loss:     Estimated blood loss was minimal. Procedure:                Pre-Anesthesia Assessment:                           - Prior to the procedure, a History and Physical                            was performed, and patient medications and                            allergies were reviewed. The patient's tolerance of                            previous anesthesia was also reviewed. The risks                            and benefits of the procedure and the sedation                            options and risks were discussed with the patient.                            All questions were answered, and informed consent                            was obtained. Prior Anticoagulants: The patient has                            taken no anticoagulant or antiplatelet agents                            except for aspirin. ASA Grade Assessment: II - A  patient with mild systemic disease. After reviewing                            the risks and benefits, the patient was deemed in                            satisfactory condition to undergo the procedure.                           After obtaining informed consent, the endoscope was                             passed under direct vision. Throughout the                            procedure, the patient's blood pressure, pulse, and                            oxygen saturations were monitored continuously. The                            [id number]-H190 ([id number]) Olympus endoscope was introduced                            through the mouth, and advanced to the second part                            of duodenum. The Radial EUS GF-U160 ([id number] ) was                            introduced through the mouth, and advanced to the                            stomach for ultrasound examination from the                            esophagus and stomach. The GF-[id number] ([id number])                            Olympus linear ultrasound scope was introduced                            through the mouth, and advanced to the stomach for                            ultrasound examination from the esophagus and                            stomach. The upper EUS was accomplished without                            difficulty. The patient tolerated the procedure. Scope In: Scope Out: Findings:      ENDOSCOPIC FINDING: :  No gross lesions were noted in the entire esophagus.      The Z-line was irregular and was found 40 cm from the incisors.      A 1 cm hiatal hernia was present.      Striped mildly erythematous mucosa without bleeding was found in the       gastric body and in the gastric antrum.      No other gross lesions were noted in the entire examined stomach.       Biopsies were taken with a cold forceps for histology and Helicobacter       pylori testing.      No gross lesions were noted in the duodenal bulb, in the first portion       of the duodenum and in the second portion of the duodenum.      The major papilla was normal.      ENDOSONOGRAPHIC FINDING: :      A round intramural (subepithelial) lesion was found on the lesser curve       of the stomach. It was encountered at 10 cm distal to the        gastroesophageal junction. The lesion was isoechoic, homogenous,       calcified and small cystic-appearing/cavitation noted. Sonographically,       the lesion appeared to originate from the serosa (Layer 5). The lesion       measured 32 mm (in maximum thickness). The lesion also measured 25 mm in       diameter. The outer endosonographic borders were well defined. Fine       needle biopsy was performed. Color Doppler imaging was utilized prior to       needle puncture to confirm a lack of significant vascular structures       within the needle path. Eight passes were made with the 22 gauge EZ Shot       3 Plus biopsy needle using a transgastric approach. A visible core of       tissue was obtained. Preliminary cytologic examination and touch preps       were performed. Final cytology results are pending.      Endosonographic images of the rest of the proximal and distal stomach       were unremarkable. No masses and no wall thickening were identified.      Endosonographic imaging in the visualized portion of the liver showed no       mass.      No malignant-appearing lymph nodes were visualized in the paracardial       region (level 16), left gastric region (level 17), gastrohepatic       ligament (level 18), celiac region (level 20) and perigastric region.      The celiac region was visualized. Impression:               EGD Impression:                           - No gross lesions in the entire esophagus. Z-line                            irregular, 40 cm from the incisors.                           - 1 cm hiatal hernia.                           -  Erythematous mucosa in the gastric body and                            antrum. No other gross lesions in the entire                            stomach. Biopsied.                           - No gross lesions in the duodenal bulb, in the                            first portion of the duodenum and in the second                            portion  of the duodenum.                           - Normal major papilla.                           EUS Impression:                           - An intramural (subepithelial) lesion was found in                            the lesser curve of the stomach. The lesion                            appeared to originate from within the serosa (Layer                            5). Cytology results are pending. However, the                            endosonographic appearance is suspicious for                            possible stromal cell (smooth muscle) lesion. Fine                            needle biopsy performed.                           - Endosonographic images of the rest of the stomach                            were unremarkable.                           - No malignant-appearing lymph nodes were                            visualized in the paracardial region (level 16),  left gastric region (level 17), gastrohepatic                            ligament (level 18), celiac region (level 20) and                            perigastric region. Moderate Sedation:      Not Applicable - Patient had care per Anesthesia. Recommendation:           - The patient will be observed post-procedure,                            until all discharge criteria are met.                           - Discharge patient to home.                           - Patient has a contact number available for                            emergencies. The signs and symptoms of potential                            delayed complications were discussed with the                            patient. Return to normal activities tomorrow.                            Written discharge instructions were provided to the                            patient.                           - Resume previous diet.                           - Observe patient's clinical course.                           - Await cytology results  and await path results.                           - Continue Omeprazole 20 mg daily.                           - Ciprofloxacin 500 mg twice daily for 3-days to                            decrease risk of post-interventional infection.                           - Based on results will consider next steps in  evaluation.                           - The findings and recommendations were discussed                            with the patient. Procedure Code(s):        --- Professional ---                           [phone], Esophagogastroduodenoscopy, flexible,                            transoral; with transendoscopic ultrasound-guided                            intramural or transmural fine needle                            aspiration/biopsy(s), (includes endoscopic                            ultrasound examination limited to the esophagus,                            stomach or duodenum, and adjacent structures)                           43239, 59, Esophagogastroduodenoscopy, flexible,                            transoral; with biopsy, single or multiple Diagnosis Code(s):        --- Professional ---                           K22.89, Other specified disease of esophagus                           K44.9, Diaphragmatic hernia without obstruction or                            gangrene                           K31.89, Other diseases of stomach and duodenum                           I89.9, Noninfective disorder of lymphatic vessels                            and lymph nodes, unspecified                           C49.A2, Gastrointestinal stromal tumor of stomach                           R93.3, Abnormal findings on diagnostic imaging of  other parts of digestive tract CPT copyright 2022 American Medical Association. All rights reserved. The codes documented in this report are preliminary and upon coder review may  be revised to meet current  compliance requirements. Corliss Parish, MD 05/29/2023 12:29:54 PM Number of Addenda: 0

## 2023-05-29 NOTE — Anesthesia Preprocedure Evaluation (Addendum)
Anesthesia Evaluation  Patient identified by MRN, date of birth, ID band Patient awake    Reviewed: Allergy & Precautions, NPO status , Patient's Chart, lab work & pertinent test results, reviewed documented beta blocker date and time   History of Anesthesia Complications Negative for: history of anesthetic complications  Airway Mallampati: I  TM Distance: >3 FB Neck ROM: Full    Dental  (+) Dental Advisory Given   Pulmonary neg pulmonary ROS   breath sounds clear to auscultation       Cardiovascular hypertension, Pt. on medications and Pt. on home beta blockers (-) angina + CAD (non-obstructive)   Rhythm:Regular Rate:Normal  02/2023 ECHO: EF 60 to 65%.  1. The LV has normal function, no regional wall motion abnormalities. Left ventricular diastolic parameters were normal.   2. RVF is normal. The right ventricular size is normal.   3. The mitral valve is normal in structure. No evidence of MR. No evidence of mitral stenosis.   4. The aortic valve is normal in structure. AI is not visualized. No aortic stenosis is present.     Neuro/Psych CVA, No Residual Symptoms    GI/Hepatic Neg liver ROS,GERD  Controlled and Medicated,,  Endo/Other  negative endocrine ROS    Renal/GU H/o stones     Musculoskeletal   Abdominal   Peds  Hematology negative hematology ROS (+)   Anesthesia Other Findings   Reproductive/Obstetrics                             Anesthesia Physical Anesthesia Plan  ASA: 2  Anesthesia Plan: MAC   Post-op Pain Management: Minimal or no pain anticipated   Induction:   PONV Risk Score and Plan: 1 and Treatment may vary due to age or medical condition  Airway Management Planned: Natural Airway and Nasal Cannula  Additional Equipment: None  Intra-op Plan:   Post-operative Plan:   Informed Consent: I have reviewed the patients History and Physical, chart, labs and  discussed the procedure including the risks, benefits and alternatives for the proposed anesthesia with the patient or authorized representative who has indicated his/her understanding and acceptance.     Dental advisory given  Plan Discussed with: CRNA and Surgeon  Anesthesia Plan Comments:         Anesthesia Quick Evaluation

## 2023-05-30 LAB — SURGICAL PATHOLOGY

## 2023-05-31 ENCOUNTER — Encounter: Payer: Self-pay | Admitting: Gastroenterology

## 2023-06-01 ENCOUNTER — Encounter (HOSPITAL_COMMUNITY): Payer: Self-pay | Admitting: Gastroenterology

## 2023-06-01 NOTE — Progress Notes (Signed)
Stomach not painfully upset, is taking break from foods. Has instructions to follow up with clinic in AM or come to the ED if worsening; encouraged pt to drink fluids as tolerated and call 24hr number if any changes. Pt verbalized understanding.

## 2023-06-03 ENCOUNTER — Inpatient Hospital Stay: Payer: 59 | Admitting: Family

## 2023-06-03 ENCOUNTER — Inpatient Hospital Stay: Payer: 59 | Attending: Family

## 2023-06-03 ENCOUNTER — Encounter: Payer: Self-pay | Admitting: Family

## 2023-06-03 VITALS — BP 138/90 | HR 103 | Temp 98.2°F | Resp 17 | Wt 196.4 lb

## 2023-06-03 DIAGNOSIS — Z79899 Other long term (current) drug therapy: Secondary | ICD-10-CM | POA: Insufficient documentation

## 2023-06-03 DIAGNOSIS — Z886 Allergy status to analgesic agent status: Secondary | ICD-10-CM | POA: Insufficient documentation

## 2023-06-03 DIAGNOSIS — R140 Abdominal distension (gaseous): Secondary | ICD-10-CM | POA: Diagnosis not present

## 2023-06-03 DIAGNOSIS — C49A0 Gastrointestinal stromal tumor, unspecified site: Secondary | ICD-10-CM | POA: Diagnosis present

## 2023-06-03 DIAGNOSIS — R202 Paresthesia of skin: Secondary | ICD-10-CM | POA: Diagnosis not present

## 2023-06-03 DIAGNOSIS — D509 Iron deficiency anemia, unspecified: Secondary | ICD-10-CM | POA: Insufficient documentation

## 2023-06-03 DIAGNOSIS — R002 Palpitations: Secondary | ICD-10-CM | POA: Diagnosis not present

## 2023-06-03 DIAGNOSIS — Z888 Allergy status to other drugs, medicaments and biological substances status: Secondary | ICD-10-CM | POA: Insufficient documentation

## 2023-06-03 LAB — CBC WITH DIFFERENTIAL (CANCER CENTER ONLY)
Abs Immature Granulocytes: 0.01 10*3/uL (ref 0.00–0.07)
Basophils Absolute: 0.1 10*3/uL (ref 0.0–0.1)
Basophils Relative: 1 %
Eosinophils Absolute: 0.4 10*3/uL (ref 0.0–0.5)
Eosinophils Relative: 6 %
HCT: 48.2 % (ref 39.0–52.0)
Hemoglobin: 16.4 g/dL (ref 13.0–17.0)
Immature Granulocytes: 0 %
Lymphocytes Relative: 29 %
Lymphs Abs: 2 10*3/uL (ref 0.7–4.0)
MCH: 28.8 pg (ref 26.0–34.0)
MCHC: 34 g/dL (ref 30.0–36.0)
MCV: 84.6 fL (ref 80.0–100.0)
Monocytes Absolute: 0.8 10*3/uL (ref 0.1–1.0)
Monocytes Relative: 11 %
Neutro Abs: 3.6 10*3/uL (ref 1.7–7.7)
Neutrophils Relative %: 53 %
Platelet Count: 173 10*3/uL (ref 150–400)
RBC: 5.7 MIL/uL (ref 4.22–5.81)
RDW: 15.2 % (ref 11.5–15.5)
WBC Count: 6.8 10*3/uL (ref 4.0–10.5)
nRBC: 0 % (ref 0.0–0.2)

## 2023-06-03 LAB — FERRITIN: Ferritin: 244 ng/mL (ref 24–336)

## 2023-06-03 LAB — RETICULOCYTES
Immature Retic Fract: 5.4 % (ref 2.3–15.9)
RBC.: 5.65 MIL/uL (ref 4.22–5.81)
Retic Count, Absolute: 70.6 10*3/uL (ref 19.0–186.0)
Retic Ct Pct: 1.3 % (ref 0.4–3.1)

## 2023-06-03 LAB — IRON AND IRON BINDING CAPACITY (CC-WL,HP ONLY)
Iron: 110 ug/dL (ref 45–182)
Saturation Ratios: 40 % — ABNORMAL HIGH (ref 17.9–39.5)
TIBC: 274 ug/dL (ref 250–450)
UIBC: 164 ug/dL (ref 117–376)

## 2023-06-03 NOTE — Progress Notes (Signed)
Hematology and Oncology Follow Up Visit  Tyler Obrien [id number] 12/19/1960 62 y.o. 06/03/2023   Principle Diagnosis:  Iron deficiency anemia   Current Therapy:   IV iron as indicated    Interim History:  Tyler Obrien is here today for follow-up. He tolerated IV iron well but has noted recent constipation and is unsure if this is related.  He has positive bowel sounds throughout all 4 quadrants. He has some abdominal discomfort, bloating and flatus. No blood loss noted. No bruising or petechiae.  He was recently diagnosed with GIST and is waiting to follow-up with Tyler Obrien regarding treatment plan.  He has occasional palpitations.  No fever, chills, n/v, cough, rash, dizziness, SOB, chest pain or changes in bladder habits.  No swelling in his extremities.  He has occasional tingling in his left arm and sides of his feet.  No falls or syncope reported.  Appetite is down over the last week due to abdominal discomfort. He is doing his best to stay well hydrated. His weight is stable at 196 lbs.   ECOG Performance Status: 1 - Symptomatic but completely ambulatory  Medications:  Allergies as of 06/03/2023       Reactions   Nsaids Other (See Comments)   BLEEDING/ULCER   Lidocaine-menthol Rash   Prednisone Palpitations        Medication List        Accurate as of June 03, 2023  8:52 AM. If you have any questions, ask your nurse or doctor.          aspirin EC 81 MG tablet Take 81 mg by mouth daily.   azelastine 0.1 % nasal spray Commonly known as: ASTELIN Place 2 sprays into both nostrils 2 (two) times daily as needed for allergies or rhinitis.   b complex vitamins tablet Take 1 tablet by mouth daily.   carvedilol 3.125 MG tablet Commonly known as: COREG Take 1 tablet (3.125 mg total) by mouth 2 (two) times daily.   Collagen Hydrolysate Powd Take 1 tablet by mouth daily at 6 (six) AM. Collagen powder   COQ-10 PO Take 1 capsule by mouth daily. 100 mg    fluticasone 50 MCG/ACT nasal spray Commonly known as: FLONASE Place 2 sprays into both nostrils daily.   gabapentin 100 MG capsule Commonly known as: NEURONTIN 1 po q hs. Can increase to 2 po q hs as needed. What changed:  how much to take how to take this when to take this   guaiFENesin 600 MG 12 hr tablet Commonly known as: MUCINEX Take 600 mg by mouth 2 (two) times daily as needed for cough or to loosen phlegm.   Magnesium 250 MG Tabs Take 250 mg by mouth daily at 6 (six) AM.   OMEGA 3 PO Take 1 capsule by mouth daily.   omeprazole 20 MG capsule Commonly known as: PRILOSEC Take 20 mg by mouth daily as needed (acid reflux).   ondansetron 4 MG disintegrating tablet Commonly known as: ZOFRAN-ODT Dissolve 1 tablet on the tongue every 6-8 hours as needed for nausea   vitamin C 100 MG tablet Take 100 mg by mouth daily.   Vitamin D3 75 MCG (3000 UT) Tabs Take 3,000 Units by mouth daily.        Allergies:  Allergies  Allergen Reactions   Nsaids Other (See Comments)    BLEEDING/ULCER   Lidocaine-Menthol Rash   Prednisone Palpitations    Past Medical History, Surgical history, Social history, and Family History were reviewed  and updated.  Review of Systems: All other 10 point review of systems is negative.   Physical Exam:  vitals were not taken for this visit.   Wt Readings from Last 3 Encounters:  05/29/23 199 lb 15.3 oz (90.7 kg)  05/15/23 200 lb (90.7 kg)  05/13/23 200 lb (90.7 kg)    Ocular: Sclerae unicteric, pupils equal, round and reactive to light Ear-nose-throat: Oropharynx clear, dentition fair Lymphatic: No cervical or supraclavicular adenopathy Lungs no rales or rhonchi, good excursion bilaterally Heart regular rate and rhythm, no murmur appreciated Abd soft, nontender, positive bowel sounds MSK no focal spinal tenderness, no joint edema Neuro: non-focal, well-oriented, appropriate affect Breasts: Deferred   Lab Results  Component  Value Date   WBC 6.8 06/03/2023   HGB 16.4 06/03/2023   HCT 48.2 06/03/2023   MCV 84.6 06/03/2023   PLT 173 06/03/2023   Lab Results  Component Value Date   FERRITIN 213.8 05/21/2023   IRON 107 05/21/2023   IRON 107 05/21/2023   TIBC 274.4 05/21/2023   IRONPCTSAT 39.0 05/21/2023   Lab Results  Component Value Date   RETICCTPCT 1.3 06/03/2023   RBC 5.70 06/03/2023   RBC 5.65 06/03/2023   No results found for: "KPAFRELGTCHN", "LAMBDASER", "KAPLAMBRATIO" No results found for: "IGGSERUM", "IGA", "IGMSERUM" No results found for: "TOTALPROTELP", "ALBUMINELP", "A1GS", "A2GS", "BETS", "BETA2SER", "GAMS", "MSPIKE", "SPEI"   Chemistry      Component Value Date/Time   NA 141 02/25/2023 1436   K 4.5 02/25/2023 1436   CL 104 02/25/2023 1436   CO2 23 02/25/2023 1436   BUN 18 02/25/2023 1436   CREATININE 1.03 02/25/2023 1436   CREATININE 1.37 (H) 09/10/2018 0844      Component Value Date/Time   CALCIUM 9.5 02/25/2023 1436   ALKPHOS 56 01/06/2023 0749   AST 24 01/06/2023 0749   ALT 22 01/06/2023 0749   BILITOT 0.3 01/06/2023 0749       Impression and Plan: Tyler Obrien is a pleasant 62 yo gentleman with recent diagnosis of IDA and GIST.  Iron studies are pending.  Follow-up in 8 weeks.   Eileen Stanford, NP 8/27/20248:52 AM

## 2023-06-04 ENCOUNTER — Other Ambulatory Visit: Payer: Self-pay

## 2023-06-04 DIAGNOSIS — C49A4 Gastrointestinal stromal tumor of large intestine: Secondary | ICD-10-CM

## 2023-06-04 LAB — CYTOLOGY - NON PAP

## 2023-06-04 NOTE — Progress Notes (Signed)
Noted  

## 2023-06-06 ENCOUNTER — Telehealth: Payer: Self-pay | Admitting: Gastroenterology

## 2023-06-06 NOTE — Telephone Encounter (Signed)
I spoke with Tyler Obrien and she confirms that the appt has been made. Nothing further needed

## 2023-06-06 NOTE — Telephone Encounter (Signed)
Inbound call from [place] [facility] giving a call back in regards to patient referral. Please advise.

## 2023-06-11 ENCOUNTER — Ambulatory Visit (HOSPITAL_BASED_OUTPATIENT_CLINIC_OR_DEPARTMENT_OTHER)
Admission: RE | Admit: 2023-06-11 | Discharge: 2023-06-11 | Disposition: A | Discharge status: Home / Self Care | Payer: 59 | Source: Ambulatory Visit | Attending: Gastroenterology | Admitting: Gastroenterology

## 2023-06-11 ENCOUNTER — Ambulatory Visit: Payer: 59 | Admitting: Orthopedic Surgery

## 2023-06-11 DIAGNOSIS — C49A4 Gastrointestinal stromal tumor of large intestine: Secondary | ICD-10-CM | POA: Diagnosis present

## 2023-06-11 MED ORDER — IOHEXOL 350 MG/ML SOLN: 75.0000 mL | Freq: Once | INTRAVENOUS | Status: DC | PRN

## 2023-06-11 MED ORDER — IOHEXOL 300 MG/ML  SOLN
100.0000 mL | Freq: Once | INTRAMUSCULAR | Status: AC | PRN
  Administered 2023-06-11: 100 mL via INTRAVENOUS

## 2023-06-17 ENCOUNTER — Other Ambulatory Visit: Payer: Self-pay

## 2023-06-17 ENCOUNTER — Ambulatory Visit: Payer: 59 | Attending: Orthopedic Surgery

## 2023-06-17 DIAGNOSIS — R293 Abnormal posture: Secondary | ICD-10-CM | POA: Diagnosis present

## 2023-06-17 DIAGNOSIS — M6281 Muscle weakness (generalized): Secondary | ICD-10-CM | POA: Insufficient documentation

## 2023-06-17 DIAGNOSIS — R29898 Other symptoms and signs involving the musculoskeletal system: Secondary | ICD-10-CM | POA: Insufficient documentation

## 2023-06-17 DIAGNOSIS — M5030 Other cervical disc degeneration, unspecified cervical region: Secondary | ICD-10-CM | POA: Diagnosis not present

## 2023-06-17 DIAGNOSIS — M5412 Radiculopathy, cervical region: Secondary | ICD-10-CM | POA: Diagnosis not present

## 2023-06-17 DIAGNOSIS — M47812 Spondylosis without myelopathy or radiculopathy, cervical region: Secondary | ICD-10-CM | POA: Insufficient documentation

## 2023-06-17 NOTE — Therapy (Signed)
OUTPATIENT PHYSICAL THERAPY CERVICAL EVALUATION   Patient Name: Tyler Obrien MRN: [id number] DOB:01/11/1961, 62 y.o., male Today's Date: 06/17/2023  END OF SESSION:  PT End of Session - 06/17/23 1443     Visit Number 1    Date for PT Re-Evaluation 08/12/23    PT Start Time 1531    PT Stop Time 1615    PT Time Calculation (min) 44 min    Activity Tolerance Patient tolerated treatment well    Behavior During Therapy [facility] for tasks assessed/performed             Past Medical History:  Diagnosis Date   Allergy    Arthritis    Bleeding gastric ulcer    Blood transfusion without reported diagnosis    CVA (cerebral infarction) 2011   Diverticulosis    GERD (gastroesophageal reflux disease)    prilosec as needed- upset stomach mostly - started after gastric bleeding ulcer   Kidney stone    Stroke [facility]) 2011   Past Surgical History:  Procedure Laterality Date   APPENDECTOMY     BIOPSY  05/29/2023   Procedure: BIOPSY;  Surgeon: Lemar Lofty., MD;  Location: [facility];  Service: Gastroenterology;;   COLONOSCOPY     10 yrs ago    cyst removal from scrotum     cyst removed from elbow     CYSTOSCOPY/URETEROSCOPY/HOLMIUM LASER/STENT PLACEMENT Left 11/20/2022   Procedure: LEFT URETEROSCOPY/HOLMIUM LASER/STENT PLACEMENT;  Surgeon: Despina Arias, MD;  Location: [facility];  Service: Urology;  Laterality: Left;   ESOPHAGOGASTRODUODENOSCOPY N/A 05/29/2023   Procedure: ESOPHAGOGASTRODUODENOSCOPY (EGD);  Surgeon: Lemar Lofty., MD;  Location: [facility];  Service: Gastroenterology;  Laterality: N/A;   EUS N/A 05/29/2023   Procedure: UPPER ENDOSCOPIC ULTRASOUND (EUS) RADIAL;  Surgeon: Lemar Lofty., MD;  Location: [facility];  Service: Gastroenterology;  Laterality: N/A;   FINE NEEDLE ASPIRATION N/A 05/29/2023   Procedure: FINE NEEDLE ASPIRATION (FNA) LINEAR;  Surgeon: Lemar Lofty., MD;  Location: [facility];  Service: Gastroenterology;   Laterality: N/A;   FINGER FRACTURE SURGERY Right    5th finger   KIDNEY STONE SURGERY     NASAL SINUS SURGERY  08/2009   OPEN ANTERIOR SHOULDER RECONSTRUCTION     UPPER GASTROINTESTINAL ENDOSCOPY     10 yrs ago    WISDOM TOOTH EXTRACTION     Patient Active Problem List   Diagnosis Date Noted   Coronary artery disease minimal disease based on coronary scheduled from 2024 05/13/2023   IDA (iron deficiency anemia) 04/04/2023   Palpitations 02/17/2023   Atypical chest pain 02/17/2023   Dyspnea on exertion 02/17/2023   Dyslipidemia 02/17/2023   Late effect of cerebrovascular accident (CVA) 02/17/2023   DDD (degenerative disc disease), cervical 03/24/2019   Right shoulder pain 02/24/2019   Cyst of scrotum 08/27/2016   Bladder pain 08/27/2016   Microcytosis 08/27/2016   Vitamin D deficiency 08/27/2016   Ethmoid sinusitis 08/27/2016   Kidney stone 08/21/2015    PCP: Marisue [place]  REFERRING PROVIDER: Drake Leach, PA-C  REFERRING DIAG:  cervical spondylosis  THERAPY DIAG:  Cervical spondylosis  Decreased ROM of neck  Abnormal posture  Muscle weakness (generalized)  Rationale for Evaluation and Treatment: Rehabilitation  ONSET DATE: 8 months  SUBJECTIVE:  SUBJECTIVE STATEMENT: Feel like my head is jutted forward, difficulty looking up, also loss of muscle mass, weakness with all of the issues with kidney stones, then anemia, have lost a lot of endurance and strength Hand dominance: Right  PERTINENT HISTORY:   January kidney stones, then anemic, 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  Had L UE radicular Sx when cervical spine pain initiated and he saw neuro, now since he had blood transfusion for anemia now the radicular Sx are much better.  PAIN:  Are  you having pain? Yes: NPRS scale: 0-2/10 Pain location: L base of post neck upper traps Pain description: hurts with turning head L and with looking up Aggravating factors: shaving Relieving factors: avoiding neck extension  PRECAUTIONS: Other: chronic anemia  RED FLAGS: None     WEIGHT BEARING RESTRICTIONS: No  FALLS:  Has patient fallen in last 6 months? 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 At C3-4 there is a mild broad-based disc bulge with a left paracentral disc osteophyte complex. Bilateral uncovertebral degenerative changes, left greater than right. Severe left and moderate right foraminal stenosis. Mild spinal stenosis. 2. At C4-5 there is a mild broad-based disc bulge. Bilateral uncovertebral degenerative changes. Severe bilateral foraminal stenosis. Mild spinal stenosis. 3. At C5-6 there is a mild broad-based disc bulge. Bilateral uncovertebral degenerative changes. Moderate left and severe right foraminal stenosis. Mild spinal stenosis. 4. At C6-7 there is a mild broad-based disc bulge with a left paracentral disc extrusion. Moderate left foraminal stenosis. Moderate right foraminal stenosis. 5. No acute osseous injury of the cervical spine.     Electronically Signed   By: Elige Ko M.D.   On: 04/06/2023 15:38  PATIENT SURVEYS:  NDI 12/50 or 24%  COGNITION: Overall cognitive status: Within functional limits for tasks assessed  SENSATION: WFL  POSTURE:  sloped shoulders B and forward head at cervicothoracic junction  PALPATION: Normal lateral glides cervical spine, also upper cervical ROM with flex/rot test wnl, mild  tenderness noted by pt L upper traps Very stiff, hypomobile with segmental PA glides T 2 to T 8 vertebrae and corresponding ribs.   CERVICAL ROM:   Active ROM A/PROM (deg) eval  Flexion wfl  Extension 10 degrees  Right lateral flexion 50  Left lateral flexion 50  Right rotation 80  Left rotation 60   (Blank rows = not tested)  UPPER EXTREMITY ROM: all UE, shoulder ROM wnl B UPPER EXTREMITY MMT:  MMT Right eval Left eval  Shoulder flexion    Shoulder extension    Shoulder abduction    Shoulder adduction    Shoulder extension    Shoulder internal rotation    Shoulder external rotation    Middle trapezius 3 3+  Lower trapezius 3+ 3+  Elbow flexion    Elbow extension    Wrist flexion    Wrist extension    Wrist ulnar deviation    Wrist radial deviation    Wrist pronation    Wrist supination    Grip strength     (Blank rows = wfl)  CERVICAL SPECIAL TESTS:  Cranial cervical flexion test: Negative and Distraction test: Negative  FUNCTIONAL TESTS:  TODAY'S TREATMENT:                                                                                                                              DATE: 06/17/23  Prone for PA glides gr 3 transverse processes and post ribs 3 to 9  Therex:  self mobs for thoracic spine, lying on 1/2 foam roller , for pec stretch, with pec flies, also B shoulder flexion lat stretch with 4# cuff weight Short thoracic roller placed perpendicular to spine for segmental ext stretch  Instructed in home ex to strengthen middle traps, periscapular as in HEP below  PATIENT EDUCATION:  Education details: POC, goals Person educated: Patient Education method: Explanation, Demonstration, Tactile cues, and Verbal cues Education comprehension: verbalized understanding, returned demonstration, and verbal cues required  HOME EXERCISE PROGRAM: Access Code: [email or web address] URL: [email or web address] Date: 06/17/2023 Prepared by: Hughes Wyndham  Exercises - Shoulder External Rotation and Scapular Retraction with Resistance  - 1 x daily - 7 x weekly - 3 sets - 10 reps - Standing Shoulder Horizontal Abduction with Resistance  - 1 x daily - 7 x weekly - 3 sets - 10 reps  ASSESSMENT:  CLINICAL IMPRESSION: Patient is a 62 y.o. male who was evaluated today by skilled physical therapy for cervical spine stiffness and L UE radiculopathy.  His radiculopathy has resolved , he thinks due to anemia treatment, had iron infusion.  Also he has has kidney stones and testing for GIST GI . Has lost strength and endurance in the last 9 months due to his co-morbidities, which he thinks is contributing to his neck pain. He is very stiff upper thoracic spine and weakness evident with periscapular musculature as well.  Not very painful today with palpation, just more stiff. Will benefit from skilled PT to address his lower cervical and mid. Upper thoracic spine stiffness to improve his function and activity tolerance.  He is motivated to improve his strength and endurance as well, wants a guided ex program in order to safely return to his prior level of function.  OBJECTIVE IMPAIRMENTS: decreased ROM, decreased strength, hypomobility, increased fascial restrictions, impaired flexibility, improper body mechanics, postural dysfunction, and pain.   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   REHAB POTENTIAL: Good  CLINICAL DECISION MAKING: Stable/uncomplicated  EVALUATION COMPLEXITY: Low   GOALS: Goals reviewed with patient? Yes  SHORT TERM GOALS: Target date: 07/01/23  I HEP Baseline: initiated today Goal status: INITIAL  LONG  TERM GOALS: Target date: 08/12/23: 8 weeks   NDI score: 2/50 Baseline: 12/50 Goal status: INITIAL  2.  Resolution of radicular Sx L UE Baseline:  Goal status: INITIAL  3.  Improve horizontal abduction strength( middle traps) to 5/5 with MMT for  improve control of periscapular musculature Baseline:  Goal status: INITIAL  4.  Improve ability(ROM) to extend cervical spine from 10% to 50% or better Baseline:  Goal status: INITIAL    PLAN:  PT FREQUENCY: 2x/week  PT DURATION: 8 weeks  PLANNED INTERVENTIONS: Therapeutic exercises, Therapeutic activity, Neuromuscular re-education, Balance training, Gait training, Patient/Family education, Self Care, and Joint mobilization  PLAN FOR NEXT SESSION: recommend cardiovascular conditioning, strengthening ex for periscapular musculature, shoulders, manual techniques, jt mobs, TPDN as needed to manage pain/stiffness   Alister Staver L Jaelynne Hockley, PT, DPT, OCS 06/17/2023, 5:47 PM

## 2023-06-23 ENCOUNTER — Encounter: Payer: Self-pay | Admitting: Dermatology

## 2023-06-23 ENCOUNTER — Ambulatory Visit (INDEPENDENT_AMBULATORY_CARE_PROVIDER_SITE_OTHER): Payer: 59 | Admitting: Dermatology

## 2023-06-23 VITALS — BP 138/82 | HR 96

## 2023-06-23 DIAGNOSIS — L814 Other melanin hyperpigmentation: Secondary | ICD-10-CM

## 2023-06-23 DIAGNOSIS — L570 Actinic keratosis: Secondary | ICD-10-CM | POA: Diagnosis not present

## 2023-06-23 DIAGNOSIS — L578 Other skin changes due to chronic exposure to nonionizing radiation: Secondary | ICD-10-CM | POA: Diagnosis not present

## 2023-06-23 DIAGNOSIS — B078 Other viral warts: Secondary | ICD-10-CM | POA: Diagnosis not present

## 2023-06-23 DIAGNOSIS — D1801 Hemangioma of skin and subcutaneous tissue: Secondary | ICD-10-CM

## 2023-06-23 DIAGNOSIS — L821 Other seborrheic keratosis: Secondary | ICD-10-CM

## 2023-06-23 DIAGNOSIS — D229 Melanocytic nevi, unspecified: Secondary | ICD-10-CM

## 2023-06-23 DIAGNOSIS — L858 Other specified epidermal thickening: Secondary | ICD-10-CM | POA: Diagnosis not present

## 2023-06-23 DIAGNOSIS — D485 Neoplasm of uncertain behavior of skin: Secondary | ICD-10-CM

## 2023-06-23 DIAGNOSIS — Z1283 Encounter for screening for malignant neoplasm of skin: Secondary | ICD-10-CM | POA: Diagnosis not present

## 2023-06-23 DIAGNOSIS — Z808 Family history of malignant neoplasm of other organs or systems: Secondary | ICD-10-CM

## 2023-06-23 DIAGNOSIS — W908XXA Exposure to other nonionizing radiation, initial encounter: Secondary | ICD-10-CM

## 2023-06-23 NOTE — Progress Notes (Signed)
New Patient Visit   Subjective  Tyler Obrien is a 62 y.o. male who presents for the following: Skin Cancer Screening and Upper Body Skin Exam  The patient presents for Upper Body Skin Exam (UBSE) for skin cancer screening and mole check. The patient has spots, moles and lesions to be evaluated, some may be new or changing and the patient may have concern these could be cancer. Pt has never had a skin cancer but there is family hx of skin cancer. Pt has had spots frozen off in the past. His last skin check was around 10 years ago. Pt uses sunscreen but not daily.   The following portions of the chart were reviewed this encounter and updated as appropriate: medications, allergies, medical history  Review of Systems:  No other skin or systemic complaints except as noted in HPI or Assessment and Plan.  Objective  Well appearing patient in no apparent distress; mood and affect are within normal limits.  All skin waist up examined. Relevant physical exam findings are noted in the Assessment and Plan.  face and left hand (9) Pink crusted papules clinically suggestive of AK on forehead and cheeks  Left chest 5mm pink pearly papule       Left Forearm 8mm pink papule         Assessment & Plan   AK (actinic keratosis) (9) face and left hand  Destruction of lesion - face and left hand (9)  Destruction method: cryotherapy   Timeout:  patient name, date of birth, surgical site, and procedure verified Lesion destroyed using liquid nitrogen: Yes   Region frozen until ice ball extended beyond lesion: Yes    Neoplasm of uncertain behavior of skin (2) Left chest  Skin / nail biopsy Type of biopsy: tangential   Informed consent: discussed and consent obtained   Timeout: patient name, date of birth, surgical site, and procedure verified   Procedure prep:  Patient was prepped and draped in usual sterile fashion Prep type:  Isopropyl alcohol Anesthesia: the lesion was  anesthetized in a standard fashion   Anesthetic:  1% lidocaine w/ epinephrine 1-100,000 buffered w/ 8.4% NaHCO3 Instrument used: DermaBlade   Hemostasis achieved with: aluminum chloride   Outcome: patient tolerated procedure well   Post-procedure details: sterile dressing applied and wound care instructions given   Dressing type: petrolatum gauze and bandage    Specimen 1 - Surgical pathology Differential Diagnosis: R/O BCC  Check Margins: No  Left Forearm  Skin / nail biopsy Type of biopsy: tangential   Informed consent: discussed and consent obtained   Timeout: patient name, date of birth, surgical site, and procedure verified   Procedure prep:  Patient was prepped and draped in usual sterile fashion Prep type:  Isopropyl alcohol Anesthesia: the lesion was anesthetized in a standard fashion   Anesthetic:  1% lidocaine w/ epinephrine 1-100,000 buffered w/ 8.4% NaHCO3 Instrument used: DermaBlade   Hemostasis achieved with: aluminum chloride   Outcome: patient tolerated procedure well   Post-procedure details: sterile dressing applied and wound care instructions given   Dressing type: petrolatum gauze and bandage    Specimen 2 - Surgical pathology Differential Diagnosis: R/O BCC  Check Margins: No   Skin cancer screening performed today.  Actinic Damage - Chronic condition, secondary to cumulative UV/sun exposure - diffuse scaly erythematous macules with underlying dyspigmentation - Recommend daily broad spectrum sunscreen SPF 30+ to sun-exposed areas, reapply every 2 hours as needed.  - Staying in the shade or  wearing long sleeves, sun glasses (UVA+UVB protection) and wide brim hats (4-inch brim around the entire circumference of the hat) are also recommended for sun protection.  - Call for new or changing lesions.   Melanocytic Nevi - Tan-brown and/or pink-flesh-colored symmetric macules and papules - Benign appearing on exam today - Observation - Call clinic for new  or changing moles - Recommend daily use of broad spectrum spf 30+ sunscreen to sun-exposed areas.   SEBORRHEIC KERATOSIS - Stuck-on, waxy, tan-brown papules and/or plaques  - Benign-appearing - Discussed benign etiology and prognosis. - Observe - Call for any changes   HEMANGIOMA Exam: red papule(s) Discussed benign nature. Recommend observation. Call for changes.   LENTIGINES Exam: scattered tan macules Due to sun exposure Treatment Plan: Benign-appearing, observe. Recommend daily broad spectrum sunscreen SPF 30+ to sun-exposed areas, reapply every 2 hours as needed.  Call for any changes   No follow-ups on file.  Owens Shark, CMA, am acting as scribe for [employer], DO.   Documentation: I have reviewed the above documentation for accuracy and completeness, and I agree with the above.  Tyler Reusing, DO

## 2023-06-23 NOTE — Patient Instructions (Addendum)

## 2023-06-23 NOTE — Telephone Encounter (Signed)
I doubt there would be anything sooner at another facility. Based on how long this patient has likely had this and the way most of these GISTS grow over time, I would stay with [facility] as a recommendation as I believe in their group.. They could see if Dr. Freida Busman or Dr. Michaell Cowing have availability sooner if able, since they take care of GISTs as well. If he is adament about another referral (which is certainly in his rights), then place to Surgical Oncology at [facility] or [facility] (though I don't have any particular surgeon in mind). GM

## 2023-06-24 ENCOUNTER — Ambulatory Visit: Payer: 59 | Admitting: Medical

## 2023-06-24 VITALS — BP 128/88 | HR 103 | Temp 98.1°F | Resp 18 | Ht 69.0 in | Wt 195.4 lb

## 2023-06-24 DIAGNOSIS — F439 Reaction to severe stress, unspecified: Secondary | ICD-10-CM | POA: Diagnosis not present

## 2023-06-24 DIAGNOSIS — E041 Nontoxic single thyroid nodule: Secondary | ICD-10-CM

## 2023-06-24 DIAGNOSIS — M542 Cervicalgia: Secondary | ICD-10-CM

## 2023-06-24 DIAGNOSIS — R5383 Other fatigue: Secondary | ICD-10-CM

## 2023-06-24 DIAGNOSIS — N401 Enlarged prostate with lower urinary tract symptoms: Secondary | ICD-10-CM

## 2023-06-24 DIAGNOSIS — D649 Anemia, unspecified: Secondary | ICD-10-CM

## 2023-06-24 DIAGNOSIS — F419 Anxiety disorder, unspecified: Secondary | ICD-10-CM | POA: Diagnosis not present

## 2023-06-24 DIAGNOSIS — R350 Frequency of micturition: Secondary | ICD-10-CM

## 2023-06-24 NOTE — Patient Instructions (Addendum)
1. Fatigue, unspecified type Work up negative except anemia found. Now corrected but still fatigued.   2. Stress - numerous issues dealing with  3. Anxiety -could consider low dose effexor  xr 37.5 mg daily. Can help with anxiety. Norepinephrine component of med may help with energy  4. Neck pain Start PT and continue gabapentin only at night.  5. Benign prostatic hyperplasia with urinary frequency -continue low dose flomax.   6. Anemia, unspecified type  -treated with iron infusion thru hematologist.  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  8Thyroid nodule- small at this point and no follwow up recommended. But do recommend recheck on exam periodically. If symptoms over area then check sooner. Any changes on exam or symptoms get thyroid US. Tsh in past normal.  Followed up 2-3 months or sooner if needed.

## 2023-06-24 NOTE — Therapy (Signed)
OUTPATIENT PHYSICAL THERAPY CERVICAL TREATMENT   Patient Name: Tyler Obrien MRN: [id number] DOB:1960/11/10, 62 y.o., male Today's Date: 06/25/2023  END OF SESSION:  PT End of Session - 06/25/23 1607     Visit Number 2    Date for PT Re-Evaluation 08/12/23    PT Start Time 1607    PT Stop Time 1648    PT Time Calculation (min) 41 min    Activity Tolerance Patient tolerated treatment well    Behavior During Therapy [facility] for tasks assessed/performed              Past Medical History:  Diagnosis Date   Allergy    Arthritis    Bleeding gastric ulcer    Blood transfusion without reported diagnosis    CVA (cerebral infarction) 2011   Diverticulosis    GERD (gastroesophageal reflux disease)    prilosec as needed- upset stomach mostly - started after gastric bleeding ulcer   Kidney stone    Stroke [facility]) 2011   Past Surgical History:  Procedure Laterality Date   APPENDECTOMY     BIOPSY  05/29/2023   Procedure: BIOPSY;  Surgeon: Lemar Lofty., MD;  Location: [facility];  Service: Gastroenterology;;   COLONOSCOPY     10 yrs ago    cyst removal from scrotum     cyst removed from elbow     CYSTOSCOPY/URETEROSCOPY/HOLMIUM LASER/STENT PLACEMENT Left 11/20/2022   Procedure: LEFT URETEROSCOPY/HOLMIUM LASER/STENT PLACEMENT;  Surgeon: Despina Arias, MD;  Location: [facility] OR[facility];  Service: Urology;  Laterality: Left;   ESOPHAGOGASTRODUODENOSCOPY N/A 05/29/2023   Procedure: ESOPHAGOGASTRODUODENOSCOPY (EGD);  Surgeon: Lemar Lofty., MD;  Location: [facility];  Service: Gastroenterology;  Laterality: N/A;   EUS N/A 05/29/2023   Procedure: UPPER ENDOSCOPIC ULTRASOUND (EUS) RADIAL;  Surgeon: Lemar Lofty., MD;  Location: [facility];  Service: Gastroenterology;  Laterality: N/A;   FINE NEEDLE ASPIRATION N/A 05/29/2023   Procedure: FINE NEEDLE ASPIRATION (FNA) LINEAR;  Surgeon: Lemar Lofty., MD;  Location: [facility];  Service: Gastroenterology;   Laterality: N/A;   FINGER FRACTURE SURGERY Right    5th finger   KIDNEY STONE SURGERY     NASAL SINUS SURGERY  08/2009   OPEN ANTERIOR SHOULDER RECONSTRUCTION     UPPER GASTROINTESTINAL ENDOSCOPY     10 yrs ago    WISDOM TOOTH EXTRACTION     Patient Active Problem List   Diagnosis Date Noted   Coronary artery disease minimal disease based on coronary scheduled from 2024 05/13/2023   IDA (iron deficiency anemia) 04/04/2023   Palpitations 02/17/2023   Atypical chest pain 02/17/2023   Dyspnea on exertion 02/17/2023   Dyslipidemia 02/17/2023   Late effect of cerebrovascular accident (CVA) 02/17/2023   DDD (degenerative disc disease), cervical 03/24/2019   Right shoulder pain 02/24/2019   Cyst of scrotum 08/27/2016   Bladder pain 08/27/2016   Microcytosis 08/27/2016   Vitamin D deficiency 08/27/2016   Ethmoid sinusitis 08/27/2016   Kidney stone 08/21/2015    PCP: Marisue Brooklyn  REFERRING PROVIDER: Drake Leach, PA-C  REFERRING DIAG:  cervical spondylosis  THERAPY DIAG:  Cervical spondylosis  Decreased ROM of neck  Abnormal posture  Muscle weakness (generalized)  Rationale for Evaluation and Treatment: Rehabilitation  ONSET DATE: 8 months  SUBJECTIVE:  SUBJECTIVE STATEMENT: No pain reported. No difficulty with band exercises. 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  Had L UE radicular Sx when cervical spine pain initiated and he saw neuro, now since he had blood transfusion for anemia now the radicular Sx are much better.  PAIN:  Are you having pain? Yes: NPRS scale: 0-2/10 Pain location: L base of post neck upper traps Pain description: hurts with turning head L and  with looking up Aggravating factors: shaving Relieving factors: avoiding neck extension  PRECAUTIONS: Other: chronic anemia  RED FLAGS: None     WEIGHT BEARING RESTRICTIONS: No  FALLS:  Has patient fallen in last 6 months? 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 At C3-4 there is a mild broad-based disc bulge with a left paracentral disc osteophyte complex. Bilateral uncovertebral degenerative changes, left greater than right. Severe left and moderate right foraminal stenosis. Mild spinal stenosis. 2. At C4-5 there is a mild broad-based disc bulge. Bilateral uncovertebral degenerative changes. Severe bilateral foraminal stenosis. Mild spinal stenosis. 3. At C5-6 there is a mild broad-based disc bulge. Bilateral uncovertebral degenerative changes. Moderate left and severe right foraminal stenosis. Mild spinal stenosis. 4. At C6-7 there is a mild broad-based disc bulge with a left paracentral disc extrusion. Moderate left foraminal stenosis. Moderate right foraminal stenosis. 5. No acute osseous injury of the cervical spine.     Electronically Signed   By: Elige Ko M.D.   On: 04/06/2023 15:38  PATIENT SURVEYS:  NDI 12/50 or 24%  COGNITION: Overall cognitive status: Within functional limits for tasks assessed  SENSATION: WFL  POSTURE:  sloped shoulders B and forward head at cervicothoracic junction  PALPATION: Normal lateral glides cervical spine, also upper cervical ROM with flex/rot test wnl, mild tenderness noted by pt L upper traps Very stiff, hypomobile with segmental PA glides T 2 to T 8 vertebrae and corresponding  ribs.   CERVICAL ROM:   Active ROM A/PROM (deg) eval  Flexion wfl  Extension 10 degrees  Right lateral flexion 50  Left lateral flexion 50  Right rotation 80  Left rotation 60   (Blank rows = not tested)  UPPER EXTREMITY ROM: all UE, shoulder ROM wnl B UPPER EXTREMITY MMT:  MMT Right eval Left eval  Shoulder flexion    Shoulder extension    Shoulder abduction    Shoulder adduction    Shoulder extension    Shoulder internal rotation    Shoulder external rotation    Middle trapezius 3 3+  Lower trapezius 3+ 3+  Elbow flexion    Elbow extension    Wrist flexion    Wrist extension    Wrist ulnar deviation    Wrist radial deviation    Wrist pronation    Wrist supination    Grip strength     (Blank rows = wfl)  CERVICAL SPECIAL TESTS:  Cranial cervical flexion test: Negative and Distraction test: Negative  FUNCTIONAL TESTS:    TODAY'S TREATMENT:  DATE:  06/25/23  UBE L 2.5 x 6 min 3 fwd/3bwd ER with retraction x 20 Horizontal ABD green x 20 Open books x 5 Bil with prolonged hold at end with deep breaths Thread the needle quadriped to thoracic rotation x 5 B Seated paraspinal stretch on green physioball x 20 sec 3 way Seated traction with rotation  x 5 Prone on elbows diagonals looking elbow to opp shoulder x 10 B Manual: Skilled palpation and monitoring of soft tissues during DN. STM to L UT, levator and cervical paraspinals. UPA and CPA mobs to mid and upper T and lower Cspines. Trigger Point Dry-Needling  Treatment instructions: Expect mild to moderate muscle soreness. S/S of pneumothorax if dry needled over a lung field, and to seek immediate medical attention should they occur. Patient verbalized understanding of these instructions and education. Patient Consent Given: Yes Education handout provided: Yes Muscles treated: L Levator scap,  UT and cervical multifidi Electrical stimulation performed: No Parameters: N/A Treatment response/outcome: Twitch Response Elicited and Palpable Increase in Muscle Length    06/17/23  Prone for PA glides gr 3 transverse processes and post ribs 3 to 9  Therex:  self mobs for thoracic spine, lying on 1/2 foam roller , for pec stretch, with pec flies, also B shoulder flexion lat stretch with 4# cuff weight Short thoracic roller placed perpendicular to spine for segmental ext stretch  Instructed in home ex to strengthen middle traps, periscapular as in HEP below  PATIENT EDUCATION:  Education details: HEP update and DN rational, procedure, outcomes, potential side effects, and recommended post-treatment exercises/activity  Person educated: Patient Education method: Explanation, Demonstration, Tactile cues, and Verbal cues Education comprehension: verbalized understanding, returned demonstration, and verbal cues required  HOME EXERCISE PROGRAM: Access Code: [email or web address] URL: [email or web address] Date: 06/25/2023 Prepared by: Raynelle Fanning  Exercises - Shoulder External Rotation and Scapular Retraction with Resistance  - 1 x daily - 7 x weekly - 3 sets - 10 reps - Standing Shoulder Horizontal Abduction with Resistance  - 1 x daily - 7 x weekly - 3 sets - 10 reps - Sidelying Thoracic Rotation with Open Book  - 1 x daily - 3-4 x weekly - 2 sets - 5 reps - Quadruped Thoracic Rotation - Reach Under  - 1 x daily - 3-4 x weekly - 1 sets - 10 reps - Cervical Rotation Prone on Elbows  - 1 x daily - 7 x weekly - 1 sets - 10 reps  - DN handout  ASSESSMENT:  CLINICAL IMPRESSION: Kenard Gower did well with thoracic mobility exercises although they were somewhat challenging. He did improve ROM with multiple reps. Initial trial of DN with good response. Pain was abolished in L levator and UT with left cervical rotation afterwards. He reported feeling more tightness in the upper cervical spine at end of  session and may benefit from more DN and/or mobilizations in this area next visit.   OBJECTIVE IMPAIRMENTS: decreased ROM, decreased strength, hypomobility, increased fascial restrictions, impaired flexibility, improper body mechanics, postural dysfunction, and pain.   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   REHAB POTENTIAL: Good  CLINICAL DECISION MAKING: Stable/uncomplicated  EVALUATION COMPLEXITY: Low   GOALS: Goals reviewed with patient? Yes  SHORT TERM GOALS: Target date: 07/01/23  I HEP Baseline:  initiated today Goal status: INITIAL  LONG TERM GOALS: Target date: 08/12/23: 8 weeks   NDI score: 2/50 Baseline: 12/50 Goal status: INITIAL  2.  Resolution of radicular Sx L UE Baseline:  Goal status: INITIAL  3.  Improve horizontal abduction strength( middle traps) to 5/5 with MMT for improve control of periscapular musculature Baseline:  Goal status: INITIAL  4.  Improve ability(ROM) to extend cervical spine from 10% to 50% or better Baseline:  Goal status: INITIAL    PLAN:  PT FREQUENCY: 2x/week  PT DURATION: 8 weeks  PLANNED INTERVENTIONS: Therapeutic exercises, Therapeutic activity, Neuromuscular re-education, Balance training, Gait training, Patient/Family education, Self Care, and Joint mobilization  PLAN FOR NEXT SESSION: recommend cardiovascular conditioning, strengthening ex for periscapular musculature, shoulders, manual techniques, jt mobs, TPDN as needed to manage pain/stiffness   Solon Palm, PT  06/25/2023, 4:55 PM

## 2023-06-24 NOTE — Progress Notes (Signed)
Subjective:    Patient ID: Tyler Obrien, male    DOB: March 15, 1961, 62 y.o.   MRN: [id number]  HPI  Pt in for follow up.  Pt states he had surgery that was canceled for(. gastrointestinal stromal tumor). Pt is seeing [facility].     Pt also has bph symptoms. Pt has has seen urologist and just started on 0.4 mg flomax. Urologist advised urinary symptoms may improve up to 2 weeks mark.  Hx of anemia. His anemia is at good range recently.   Pt ct abd pelvis showed.  IMPRESSION: 1. 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 Further evaluation with endoscopy/tissue sampling is again recommended if not already performed. 2. No acute intra-abdominal pathology identified. 3. Multiple other nonacute observations, as described above.    Impression and Plan: Tyler Obrien is a pleasant 62 yo gentleman with recent diagnosis of IDA and GIST.  Iron studies are pending.  Follow-up in 8 weeks.    Pt also has neck pain. No longer having pain running down his arm.  Pt Mri below.  1. At C3-4 there is a mild broad-based disc bulge with a left paracentral disc osteophyte complex. Bilateral uncovertebral degenerative changes, left greater than right. Severe left and moderate right foraminal stenosis. Mild spinal stenosis. 2. At C4-5 there is a mild broad-based disc bulge. Bilateral uncovertebral degenerative changes. Severe bilateral foraminal stenosis. Mild spinal stenosis. 3. At C5-6 there is a mild broad-based disc bulge. Bilateral uncovertebral degenerative changes. Moderate left and severe right foraminal stenosis. Mild spinal stenosis. 4. At C6-7 there is a mild broad-based disc bulge with a left paracentral disc extrusion. Moderate left foraminal stenosis. Moderate right foraminal stenosis. 5. No acute osseous injury of the cervical spine.   Pt no longer has  pain radiating down left arm. About to start PT. Pt using gabapentin at night.    Pt saw Dr. Kirtland Bouchard.  Pt has mild tachycardia. He states coreg.   Stress related to canceled sx and diagnosis. Now having to wait until January. Also some business challenges.  He is still feeling fatigued. Work up in past was negative.    Review of Systems     Objective:   Physical Exam  General Mental Status- Alert. General Appearance- Not in acute distress.   Skin General: Color- Normal Color. Moisture- Normal Moisture.  Neck Carotid Arteries- Normal color. Moisture- Normal Moisture. No carotid bruits. No JVD.  Chest and Lung Exam Auscultation: Breath Sounds:-Normal.  Cardiovascular Auscultation:Rythm- Regular. Murmurs & Other Heart Sounds:Auscultation of the heart reveals- No Murmurs.  Abdomen Inspection:-Inspeection Normal. Palpation/Percussion:Note:No mass. Palpation and Percussion of the abdomen reveal- Non Tender, Non Distended + BS, no rebound or guarding.    Neurologic Cranial Nerve exam:- CN III-XII intact(No nystagmus), symmetric smile. Drift Test:- No drift. Romberg Exam:- Negative.  Heal to Toe Gait exam:-Normal. Finger to Nose:- Normal/Intact Strength:- 5/5 equal and symmetric strength both upper and lower extremities.       Assessment & Plan:   Patient Instructions  1. Fatigue, unspecified type Work up negative except anemia found. Now corrected but still fatigued.   2. Stress - numerous issues dealing with  3. Anxiety -could consider low dose effexor  xr 37.5 mg daily. Can help with anxiety. Norepinephrine component of med may help with energy  4. Neck pain Start PT and continue gabapentin only at night.  5. Benign prostatic hyperplasia with urinary frequency -continue  low dose flomax.   6. Anemia, unspecified type  -treated with iron infusion thru hematologist.  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  8Thyroid nodule- small at this point  and no follwow up recommended. But do recommend recheck on exam periodically. If symptoms over area then check sooner. Any changes on exam or symptoms get thyroid US. Tsh in past normal.  Followed up 2-3 months or sooner if needed.     Esperanza Richters, PA-C

## 2023-06-25 ENCOUNTER — Encounter: Payer: Self-pay | Admitting: Physical Therapy

## 2023-06-25 ENCOUNTER — Ambulatory Visit: Payer: 59 | Admitting: Physical Therapy

## 2023-06-25 DIAGNOSIS — M6281 Muscle weakness (generalized): Secondary | ICD-10-CM

## 2023-06-25 DIAGNOSIS — M47812 Spondylosis without myelopathy or radiculopathy, cervical region: Secondary | ICD-10-CM | POA: Diagnosis not present

## 2023-06-25 DIAGNOSIS — R29898 Other symptoms and signs involving the musculoskeletal system: Secondary | ICD-10-CM

## 2023-06-25 DIAGNOSIS — R293 Abnormal posture: Secondary | ICD-10-CM

## 2023-06-25 NOTE — Progress Notes (Signed)
Hi Jetta,  Please call patient with his pathology results.  Diagnosis 1. 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 Skin , left forearm HYPERTROPHIC ACTINIC KERATOSIS --> precancer lesion however the bx removed it so we don't need any additional treatment.   Thanks

## 2023-06-26 ENCOUNTER — Ambulatory Visit: Payer: 59

## 2023-06-26 ENCOUNTER — Other Ambulatory Visit: Payer: Self-pay

## 2023-06-26 DIAGNOSIS — R293 Abnormal posture: Secondary | ICD-10-CM

## 2023-06-26 DIAGNOSIS — R29898 Other symptoms and signs involving the musculoskeletal system: Secondary | ICD-10-CM

## 2023-06-26 DIAGNOSIS — M47812 Spondylosis without myelopathy or radiculopathy, cervical region: Secondary | ICD-10-CM

## 2023-06-26 DIAGNOSIS — M6281 Muscle weakness (generalized): Secondary | ICD-10-CM

## 2023-06-26 NOTE — Therapy (Signed)
OUTPATIENT PHYSICAL THERAPY CERVICAL TREATMENT   Patient Name: Tyler Obrien MRN: [id number] DOB:27-Apr-1961, 62 y.o., male Today's Date: 06/26/2023  END OF SESSION:  PT End of Session - 06/26/23 0806     Visit Number 3    Date for PT Re-Evaluation 08/12/23    PT Start Time 0800    PT Stop Time 0845    PT Time Calculation (min) 45 min              Past Medical History:  Diagnosis Date   Allergy    Arthritis    Bleeding gastric ulcer    Blood transfusion without reported diagnosis    CVA (cerebral infarction) 2011   Diverticulosis    GERD (gastroesophageal reflux disease)    prilosec as needed- upset stomach mostly - started after gastric bleeding ulcer   Kidney stone    Stroke [facility]) 2011   Past Surgical History:  Procedure Laterality Date   APPENDECTOMY     BIOPSY  05/29/2023   Procedure: BIOPSY;  Surgeon: Lemar Lofty., MD;  Location: [facility];  Service: Gastroenterology;;   COLONOSCOPY     10 yrs ago    cyst removal from scrotum     cyst removed from elbow     CYSTOSCOPY/URETEROSCOPY/HOLMIUM LASER/STENT PLACEMENT Left 11/20/2022   Procedure: LEFT URETEROSCOPY/HOLMIUM LASER/STENT PLACEMENT;  Surgeon: Despina Arias, MD;  Location: [facility];  Service: Urology;  Laterality: Left;   ESOPHAGOGASTRODUODENOSCOPY N/A 05/29/2023   Procedure: ESOPHAGOGASTRODUODENOSCOPY (EGD);  Surgeon: Lemar Lofty., MD;  Location: [facility];  Service: Gastroenterology;  Laterality: N/A;   EUS N/A 05/29/2023   Procedure: UPPER ENDOSCOPIC ULTRASOUND (EUS) RADIAL;  Surgeon: Lemar Lofty., MD;  Location: [facility];  Service: Gastroenterology;  Laterality: N/A;   FINE NEEDLE ASPIRATION N/A 05/29/2023   Procedure: FINE NEEDLE ASPIRATION (FNA) LINEAR;  Surgeon: Lemar Lofty., MD;  Location: [facility];  Service: Gastroenterology;  Laterality: N/A;   FINGER FRACTURE SURGERY Right    5th finger   KIDNEY STONE SURGERY     NASAL SINUS SURGERY   08/2009   OPEN ANTERIOR SHOULDER RECONSTRUCTION     UPPER GASTROINTESTINAL ENDOSCOPY     10 yrs ago    WISDOM TOOTH EXTRACTION     Patient Active Problem List   Diagnosis Date Noted   Coronary artery disease minimal disease based on coronary scheduled from 2024 05/13/2023   IDA (iron deficiency anemia) 04/04/2023   Palpitations 02/17/2023   Atypical chest pain 02/17/2023   Dyspnea on exertion 02/17/2023   Dyslipidemia 02/17/2023   Late effect of cerebrovascular accident (CVA) 02/17/2023   DDD (degenerative disc disease), cervical 03/24/2019   Right shoulder pain 02/24/2019   Cyst of scrotum 08/27/2016   Bladder pain 08/27/2016   Microcytosis 08/27/2016   Vitamin D deficiency 08/27/2016   Ethmoid sinusitis 08/27/2016   Kidney stone 08/21/2015    PCP: Marisue Brooklyn  REFERRING PROVIDER: Drake Leach, PA-C  REFERRING DIAG:  cervical spondylosis  THERAPY DIAG:  No diagnosis found.  Rationale for Evaluation and Treatment: Rehabilitation  ONSET DATE: 8 months  SUBJECTIVE:  SUBJECTIVE STATEMENT: No pain reported. No difficulty with band exercises. 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  Had L UE radicular Sx when cervical spine pain initiated and he saw neuro, now since he had blood transfusion for anemia now the radicular Sx are much better.  PAIN:  Are you having pain? Yes: NPRS scale: 0-2/10 Pain location: L base of post neck upper traps Pain description: hurts with turning head L and with looking up Aggravating factors: shaving Relieving factors: avoiding neck extension  PRECAUTIONS: Other: chronic anemia  RED FLAGS: None     WEIGHT BEARING RESTRICTIONS:  No  FALLS:  Has patient fallen in last 6 months? 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 At C3-4 there is a mild broad-based disc bulge with a left paracentral disc osteophyte complex. Bilateral uncovertebral degenerative changes, left greater than right. Severe left and moderate right foraminal stenosis. Mild spinal stenosis. 2. At C4-5 there is a mild broad-based disc bulge. Bilateral uncovertebral degenerative changes. Severe bilateral foraminal stenosis. Mild spinal stenosis. 3. At C5-6 there is a mild broad-based disc bulge. Bilateral uncovertebral degenerative changes. Moderate left and severe right foraminal stenosis. Mild spinal stenosis. 4. At C6-7 there is a mild broad-based disc bulge with a left paracentral disc extrusion. Moderate left foraminal stenosis. Moderate right foraminal stenosis. 5. No acute osseous injury of the cervical spine.     Electronically Signed   By: Elige Ko M.D.   On: 04/06/2023 15:38  PATIENT SURVEYS:  NDI 12/50 or 24%  COGNITION: Overall cognitive status: Within functional limits for tasks assessed  SENSATION: WFL  POSTURE:  sloped shoulders B and forward head at cervicothoracic junction  PALPATION: Normal lateral glides cervical spine, also upper cervical ROM with flex/rot test wnl, mild tenderness noted by pt L upper traps Very stiff, hypomobile with segmental PA glides T 2 to T 8 vertebrae and corresponding ribs.   CERVICAL ROM:   Active ROM A/PROM (deg) eval  Flexion wfl  Extension 10 degrees  Right lateral flexion 50  Left lateral flexion 50  Right rotation 80  Left rotation 60   (Blank rows  = not tested)  UPPER EXTREMITY ROM: all UE, shoulder ROM wnl B UPPER EXTREMITY MMT:  MMT Right eval Left eval  Shoulder flexion    Shoulder extension    Shoulder abduction    Shoulder adduction    Shoulder extension    Shoulder internal rotation    Shoulder external rotation    Middle trapezius 3 3+  Lower trapezius 3+ 3+  Elbow flexion    Elbow extension    Wrist flexion    Wrist extension    Wrist ulnar deviation    Wrist radial deviation    Wrist pronation    Wrist supination    Grip strength     (Blank rows = wfl)  CERVICAL SPECIAL TESTS:  Cranial cervical flexion test: Negative and Distraction test: Negative  FUNCTIONAL TESTS:    TODAY'S TREATMENT:     06/26/23: Therapeutic exercise: instructed and progressed in the following therex to improve strength and endurance for UE and LE  musculature, and mobility, proximal strength for shoulders, thoracic spine: Nustep level 5 x  6 min Ue's and LE's     Manual:  Supine for skilled manual techniques to address stiffness cervical spine:  Cervical manual traction Upper cervical rotation with neck flexed, contract/relax to improve cervical R rotation, 5 sec holds, 2 bouts Manual stretching L upper traps, with depression scapula Manual stretching L levator with bouts of isometric holds    BATCA    seated rows, hands horizontal 25#  chest press:      15# 3 x 15       Lat pull 35# 1 set 15 Lat pull 45# 2 sets 15 Leg press 45# 10 reps 3x10    Seated stretch for thoracic spine hands on 65 cm ball  Quadriped, thread the needle                                                                                                   DATE:   06/25/23  UBE L 2.5 x 6 min 3 fwd/3bwd ER with retraction x 20 Horizontal ABD green x 20 Open books x 5 Bil with prolonged hold at end with deep breaths Thread the needle quadriped to thoracic rotation x 5 B Seated paraspinal stretch on green physioball x 20 sec 3 way Seated traction with  rotation  x 5 Prone on elbows diagonals looking elbow to opp shoulder x 10 B Manual: Skilled palpation and monitoring of soft tissues during DN. STM to L UT, levator and cervical paraspinals. UPA and CPA mobs to mid and upper T and lower Cspines. Trigger Point Dry-Needling  Treatment instructions: Expect mild to moderate muscle soreness. S/S of pneumothorax if dry needled over a lung field, and to seek immediate medical attention should they occur. Patient verbalized understanding of these instructions and education. Patient Consent Given: Yes Education handout provided: Yes Muscles treated: L Levator scap, UT and cervical multifidi Electrical stimulation performed: No Parameters: N/A Treatment response/outcome: Twitch Response Elicited and Palpable Increase in Muscle Length    06/17/23  Prone for PA glides gr 3 transverse processes and post ribs 3 to 9  Therex:  self mobs for thoracic spine, lying on 1/2 foam roller , for pec stretch, with pec flies, also B shoulder flexion lat stretch with 4# cuff weight Short thoracic roller placed perpendicular to spine for segmental ext stretch  Instructed in home ex to strengthen middle traps, periscapular as in HEP below  PATIENT EDUCATION:  Education details: HEP update and DN rational, procedure, outcomes, potential side effects, and recommended post-treatment exercises/activity  Person educated: Patient Education method: Explanation, Demonstration, Tactile cues, and Verbal cues Education comprehension: verbalized understanding, returned demonstration, and verbal cues required  HOME EXERCISE PROGRAM: Access Code: [email or web address] URL: [email or web address] Date: 06/25/2023 Prepared by: Raynelle Fanning  Exercises - Shoulder External Rotation and Scapular Retraction with Resistance  - 1 x daily - 7 x weekly - 3 sets - 10 reps - Standing Shoulder Horizontal Abduction with Resistance  - 1 x daily - 7 x weekly - 3 sets - 10 reps - Sidelying  Thoracic  Rotation with Open Book  - 1 x daily - 3-4 x weekly - 2 sets - 5 reps - Quadruped Thoracic Rotation - Reach Under  - 1 x daily - 3-4 x weekly - 1 sets - 10 reps - Cervical Rotation Prone on Elbows  - 1 x daily - 7 x weekly - 1 sets - 10 reps  - DN handout  ASSESSMENT:  CLINICAL IMPRESSION: Attending skilled PT due to stiffness cervical and thoracic spine and diffuse loss of muscle strength and endurance.  Did not dry needle again today as he was just here yesterday.  Did utilize upper cervical spine manual techniques to improve stiffness in that area.  Also progressed with UE and LE bulk strengthening with isotonic machines, finished with stretching for t spine and shoulders.  He was fatigued but overall tolerated well. OBJECTIVE IMPAIRMENTS: decreased ROM, decreased strength, hypomobility, increased fascial restrictions, impaired flexibility, improper body mechanics, postural dysfunction, and pain.   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   REHAB POTENTIAL: Good  CLINICAL DECISION MAKING: Stable/uncomplicated  EVALUATION COMPLEXITY: Low   GOALS: Goals reviewed with patient? Yes  SHORT TERM GOALS: Target date: 07/01/23  I HEP Baseline: initiated today Goal status: INITIAL  LONG TERM GOALS: Target date: 08/12/23: 8 weeks   NDI score: 2/50 Baseline: 12/50 Goal status: INITIAL  2.  Resolution of radicular Sx L UE Baseline:  Goal status: INITIAL  3.  Improve horizontal abduction strength( middle traps) to 5/5 with MMT for improve control of periscapular musculature Baseline:  Goal status: INITIAL  4.  Improve ability(ROM) to extend cervical spine from  10% to 50% or better Baseline:  Goal status: INITIAL    PLAN:  PT FREQUENCY: 2x/week  PT DURATION: 8 weeks  PLANNED INTERVENTIONS: Therapeutic exercises, Therapeutic activity, Neuromuscular re-education, Balance training, Gait training, Patient/Family education, Self Care, and Joint mobilization  PLAN FOR NEXT SESSION: recommend cardiovascular conditioning, strengthening ex for periscapular musculature, shoulders, manual techniques, jt mobs, TPDN as needed to manage pain/stiffness   Esmeralda Blanford, PT , DPT, OCS 06/26/2023, 10:27 AM

## 2023-07-01 ENCOUNTER — Other Ambulatory Visit: Payer: Self-pay

## 2023-07-01 ENCOUNTER — Ambulatory Visit: Payer: 59

## 2023-07-01 DIAGNOSIS — R29898 Other symptoms and signs involving the musculoskeletal system: Secondary | ICD-10-CM

## 2023-07-01 DIAGNOSIS — M47812 Spondylosis without myelopathy or radiculopathy, cervical region: Secondary | ICD-10-CM | POA: Diagnosis not present

## 2023-07-01 DIAGNOSIS — M6281 Muscle weakness (generalized): Secondary | ICD-10-CM

## 2023-07-01 DIAGNOSIS — R293 Abnormal posture: Secondary | ICD-10-CM

## 2023-07-01 NOTE — Therapy (Signed)
OUTPATIENT PHYSICAL THERAPY CERVICAL TREATMENT   Patient Name: Tyler Obrien MRN: [id number] DOB:02-12-1961, 62 y.o., male Today's Date: 07/01/2023  END OF SESSION:  PT End of Session - 07/01/23 1255     Progress Note Due on Visit 10    PT Start Time 0803    PT Stop Time 0847    PT Time Calculation (min) 44 min    Activity Tolerance Patient tolerated treatment well    Behavior During Therapy [facility] for tasks assessed/performed               Past Medical History:  Diagnosis Date   Allergy    Arthritis    Bleeding gastric ulcer    Blood transfusion without reported diagnosis    CVA (cerebral infarction) 2011   Diverticulosis    GERD (gastroesophageal reflux disease)    prilosec as needed- upset stomach mostly - started after gastric bleeding ulcer   Kidney stone    Stroke [facility]) 2011   Past Surgical History:  Procedure Laterality Date   APPENDECTOMY     BIOPSY  05/29/2023   Procedure: BIOPSY;  Surgeon: Lemar Lofty., MD;  Location: [facility];  Service: Gastroenterology;;   COLONOSCOPY     10 yrs ago    cyst removal from scrotum     cyst removed from elbow     CYSTOSCOPY/URETEROSCOPY/HOLMIUM LASER/STENT PLACEMENT Left 11/20/2022   Procedure: LEFT URETEROSCOPY/HOLMIUM LASER/STENT PLACEMENT;  Surgeon: Despina Arias, MD;  Location: [facility];  Service: Urology;  Laterality: Left;   ESOPHAGOGASTRODUODENOSCOPY N/A 05/29/2023   Procedure: ESOPHAGOGASTRODUODENOSCOPY (EGD);  Surgeon: Lemar Lofty., MD;  Location: [facility];  Service: Gastroenterology;  Laterality: N/A;   EUS N/A 05/29/2023   Procedure: UPPER ENDOSCOPIC ULTRASOUND (EUS) RADIAL;  Surgeon: Lemar Lofty., MD;  Location: [facility];  Service: Gastroenterology;  Laterality: N/A;   FINE NEEDLE ASPIRATION N/A 05/29/2023   Procedure: FINE NEEDLE ASPIRATION (FNA) LINEAR;  Surgeon: Lemar Lofty., MD;  Location: [facility];  Service: Gastroenterology;  Laterality: N/A;    FINGER FRACTURE SURGERY Right    5th finger   KIDNEY STONE SURGERY     NASAL SINUS SURGERY  08/2009   OPEN ANTERIOR SHOULDER RECONSTRUCTION     UPPER GASTROINTESTINAL ENDOSCOPY     10 yrs ago    WISDOM TOOTH EXTRACTION     Patient Active Problem List   Diagnosis Date Noted   Coronary artery disease minimal disease based on coronary scheduled from 2024 05/13/2023   IDA (iron deficiency anemia) 04/04/2023   Palpitations 02/17/2023   Atypical chest pain 02/17/2023   Dyspnea on exertion 02/17/2023   Dyslipidemia 02/17/2023   Late effect of cerebrovascular accident (CVA) 02/17/2023   DDD (degenerative disc disease), cervical 03/24/2019   Right shoulder pain 02/24/2019   Cyst of scrotum 08/27/2016   Bladder pain 08/27/2016   Microcytosis 08/27/2016   Vitamin D deficiency 08/27/2016   Ethmoid sinusitis 08/27/2016   Kidney stone 08/21/2015    PCP: Marisue Brooklyn  REFERRING PROVIDER: Drake Leach, PA-C  REFERRING DIAG:  cervical spondylosis  THERAPY DIAG:  Cervical spondylosis  Decreased ROM of neck  Abnormal posture  Muscle weakness (generalized)  Rationale for Evaluation and Treatment: Rehabilitation  ONSET DATE: 8 months  SUBJECTIVE:  SUBJECTIVE STATEMENT: Some pain L upper traps after last week which lasted for 2 days, a little more sore than expected. 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  Had L UE radicular Sx when cervical spine pain initiated and he saw neuro, now since he had blood transfusion for anemia now the radicular Sx are much better.  PAIN:  Are you having pain? Yes: NPRS scale: 0-2/10 Pain location: L base of post neck upper traps Pain description: hurts  with turning head L and with looking up Aggravating factors: shaving Relieving factors: avoiding neck extension  PRECAUTIONS: Other: chronic anemia  RED FLAGS: None     WEIGHT BEARING RESTRICTIONS: No  FALLS:  Has patient fallen in last 6 months? 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 At C3-4 there is a mild broad-based disc bulge with a left paracentral disc osteophyte complex. Bilateral uncovertebral degenerative changes, left greater than right. Severe left and moderate right foraminal stenosis. Mild spinal stenosis. 2. At C4-5 there is a mild broad-based disc bulge. Bilateral uncovertebral degenerative changes. Severe bilateral foraminal stenosis. Mild spinal stenosis. 3. At C5-6 there is a mild broad-based disc bulge. Bilateral uncovertebral degenerative changes. Moderate left and severe right foraminal stenosis. Mild spinal stenosis. 4. At C6-7 there is a mild broad-based disc bulge with a left paracentral disc extrusion. Moderate left foraminal stenosis. Moderate right foraminal stenosis. 5. No acute osseous injury of the cervical spine.     Electronically Signed   By: Elige Ko M.D.   On: 04/06/2023 15:38  PATIENT SURVEYS:  NDI 12/50 or 24%  COGNITION: Overall cognitive status: Within functional limits for tasks assessed  SENSATION: WFL  POSTURE:  sloped shoulders B and forward head at cervicothoracic junction  PALPATION: Normal lateral glides cervical spine, also upper cervical ROM with flex/rot test wnl, mild tenderness noted by pt L upper traps Very stiff, hypomobile with segmental PA glides T 2 to T 8 vertebrae and  corresponding ribs.   CERVICAL ROM:   Active ROM A/PROM (deg) eval  Flexion wfl  Extension 10 degrees  Right lateral flexion 50  Left lateral flexion 50  Right rotation 80  Left rotation 60   (Blank rows = not tested)  UPPER EXTREMITY ROM: all UE, shoulder ROM wnl B UPPER EXTREMITY MMT:  MMT Right eval Left eval  Shoulder flexion    Shoulder extension    Shoulder abduction    Shoulder adduction    Shoulder extension    Shoulder internal rotation    Shoulder external rotation    Middle trapezius 3 3+  Lower trapezius 3+ 3+  Elbow flexion    Elbow extension    Wrist flexion    Wrist extension    Wrist ulnar deviation    Wrist radial deviation    Wrist pronation    Wrist supination    Grip strength     (Blank rows = wfl)  CERVICAL SPECIAL TESTS:  Cranial cervical flexion test: Negative and Distraction test: Negative  FUNCTIONAL TESTS:    TODAY'S TREATMENT:     07/01/23: Manual techniques: sidelying B for rib recoil  technique Supine for manual cervical traction/distraction ,  Manual stretching L upper traps with contract/relax Rotational PA glides on R for L rotation Prone for HVLA mob cervico thoracic jxn  BATCA    seated rows, hands horizontal 25# 3 x 12  chest press:      15# 3 x 12 Lat rows 45#, 3 x 12  Seated thoracic stretch with medium physioball at end of session 06/26/23: Therapeutic exercise: instructed and progressed in the following therex to improve strength and endurance for UE and LE musculature, and mobility, proximal strength for shoulders, thoracic spine: Nustep level 5 x  6 min Ue's and LE's BATCA    seated rows, hands horizontal 25# 3 x 12  chest press:      15# 3 x 12 Lat pulls 45# 3 x 12   Manual:  Supine for skilled manual techniques to address stiffness cervical spine:  Cervical manual traction Upper cervical rotation with neck flexed, contract/relax to improve cervical R rotation, 5 sec holds, 2 bouts Manual stretching L upper  traps, with depression scapula Manual stretching L levator with bouts of isometric holds    BATCA    seated rows, hands horizontal 25#  chest press:      15# 3 x 15       Lat pull 35# 1 set 15 Lat pull 45# 2 sets 15 Leg press 45# 10 reps 3x10    Seated stretch for thoracic spine hands on 65 cm ball  Quadriped, thread the needle                                                                                                   DATE:   06/25/23  UBE L 2.5 x 6 min 3 fwd/3bwd ER with retraction x 20 Horizontal ABD green x 20 Open books x 5 Bil with prolonged hold at end with deep breaths Thread the needle quadriped to thoracic rotation x 5 B Seated paraspinal stretch on green physioball x 20 sec 3 way Seated traction with rotation  x 5 Prone on elbows diagonals looking elbow to opp shoulder x 10 B Manual: Skilled palpation and monitoring of soft tissues during DN. STM to L UT, levator and cervical paraspinals. UPA and CPA mobs to mid and upper T and lower Cspines. Trigger Point Dry-Needling  Treatment instructions: Expect mild to moderate muscle soreness. S/S of pneumothorax if dry needled over a lung field, and to seek immediate medical attention should they occur. Patient verbalized understanding of these instructions and education. Patient Consent Given: Yes Education handout provided: Yes Muscles treated: L Levator scap, UT and cervical multifidi Electrical stimulation performed: No Parameters: N/A Treatment response/outcome: Twitch Response Elicited and Palpable Increase in Muscle Length    06/17/23  Prone for PA glides gr 3 transverse processes and post ribs 3 to 9  Therex:  self mobs for thoracic spine, lying on 1/2 foam roller , for pec stretch, with pec flies, also B shoulder flexion lat stretch with 4# cuff weight Short thoracic roller placed perpendicular to spine for segmental ext stretch  Instructed in home  ex to strengthen middle traps, periscapular as in HEP  below  PATIENT EDUCATION:  Education details: HEP update and DN rational, procedure, outcomes, potential side effects, and recommended post-treatment exercises/activity  Person educated: Patient Education method: Explanation, Demonstration, Tactile cues, and Verbal cues Education comprehension: verbalized understanding, returned demonstration, and verbal cues required  HOME EXERCISE PROGRAM: Access Code: [email or web address] URL: [email or web address] Date: 06/25/2023 Prepared by: Raynelle Fanning  Exercises - Shoulder External Rotation and Scapular Retraction with Resistance  - 1 x daily - 7 x weekly - 3 sets - 10 reps - Standing Shoulder Horizontal Abduction with Resistance  - 1 x daily - 7 x weekly - 3 sets - 10 reps - Sidelying Thoracic Rotation with Open Book  - 1 x daily - 3-4 x weekly - 2 sets - 5 reps - Quadruped Thoracic Rotation - Reach Under  - 1 x daily - 3-4 x weekly - 1 sets - 10 reps - Cervical Rotation Prone on Elbows  - 1 x daily - 7 x weekly - 1 sets - 10 reps  - DN handout  ASSESSMENT:  CLINICAL IMPRESSION: Attending skilled PT due to stiffness cervical and thoracic spine and diffuse loss of muscle strength and endurance.  Utilized additional manual techniques due to stiffness and pain L upper traps region with cervical spine extension and R rotation.  Also reduced repetitions for the isotonic strengthening.  He is extending neck more easily with increased ROM noted. OBJECTIVE IMPAIRMENTS: decreased ROM, decreased strength, hypomobility, increased fascial restrictions, impaired flexibility, improper body mechanics, postural dysfunction, and pain.   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   REHAB POTENTIAL: Good  CLINICAL DECISION MAKING: Stable/uncomplicated  EVALUATION COMPLEXITY: Low   GOALS: Goals reviewed with patient? Yes  SHORT TERM GOALS: Target date: 07/01/23  I HEP Baseline: initiated today Goal status: INITIAL  LONG TERM GOALS: Target date: 08/12/23: 8 weeks   NDI score: 2/50 Baseline: 12/50 Goal status: INITIAL  2.  Resolution of radicular Sx L UE Baseline:  Goal status: INITIAL  3.  Improve horizontal abduction strength( middle traps) to 5/5 with MMT for improve control of periscapular musculature Baseline:  Goal status: INITIAL  4.  Improve ability(ROM) to extend cervical spine from 10% to 50% or better Baseline:  Goal status: INITIAL    PLAN:  PT FREQUENCY: 2x/week  PT DURATION: 8 weeks  PLANNED INTERVENTIONS: Therapeutic exercises, Therapeutic activity, Neuromuscular re-education, Balance training, Gait training, Patient/Family education, Self Care, and Joint mobilization  PLAN FOR NEXT SESSION: recommend cardiovascular conditioning, strengthening ex for periscapular musculature, shoulders, manual techniques, jt mobs, TPDN as needed to manage pain/stiffness   Calen Posch, PT , DPT, OCS 07/01/2023, 12:56 PM

## 2023-07-03 ENCOUNTER — Ambulatory Visit: Payer: 59

## 2023-07-03 ENCOUNTER — Other Ambulatory Visit: Payer: Self-pay

## 2023-07-03 DIAGNOSIS — M47812 Spondylosis without myelopathy or radiculopathy, cervical region: Secondary | ICD-10-CM | POA: Diagnosis not present

## 2023-07-03 DIAGNOSIS — M6281 Muscle weakness (generalized): Secondary | ICD-10-CM

## 2023-07-03 DIAGNOSIS — R29898 Other symptoms and signs involving the musculoskeletal system: Secondary | ICD-10-CM

## 2023-07-03 DIAGNOSIS — R293 Abnormal posture: Secondary | ICD-10-CM

## 2023-07-03 NOTE — Therapy (Signed)
OUTPATIENT PHYSICAL THERAPY CERVICAL TREATMENT   Patient Name: Tyler Obrien MRN: [id number] DOB:Dec 05, 1960, 62 y.o., male Today's Date: 07/03/2023  END OF SESSION:  PT End of Session - 07/03/23 1550     Visit Number 5    Date for PT Re-Evaluation 08/12/23    Progress Note Due on Visit 10    PT Start Time 1315    PT Stop Time 1400    PT Time Calculation (min) 45 min    Activity Tolerance Patient tolerated treatment well    Behavior During Therapy [facility] for tasks assessed/performed                Past Medical History:  Diagnosis Date   Allergy    Arthritis    Bleeding gastric ulcer    Blood transfusion without reported diagnosis    CVA (cerebral infarction) 2011   Diverticulosis    GERD (gastroesophageal reflux disease)    prilosec as needed- upset stomach mostly - started after gastric bleeding ulcer   Kidney stone    Stroke [facility]) 2011   Past Surgical History:  Procedure Laterality Date   APPENDECTOMY     BIOPSY  05/29/2023   Procedure: BIOPSY;  Surgeon: Lemar Lofty., MD;  Location: [facility];  Service: Gastroenterology;;   COLONOSCOPY     10 yrs ago    cyst removal from scrotum     cyst removed from elbow     CYSTOSCOPY/URETEROSCOPY/HOLMIUM LASER/STENT PLACEMENT Left 11/20/2022   Procedure: LEFT URETEROSCOPY/HOLMIUM LASER/STENT PLACEMENT;  Surgeon: Despina Arias, MD;  Location: [facility];  Service: Urology;  Laterality: Left;   ESOPHAGOGASTRODUODENOSCOPY N/A 05/29/2023   Procedure: ESOPHAGOGASTRODUODENOSCOPY (EGD);  Surgeon: Lemar Lofty., MD;  Location: [facility];  Service: Gastroenterology;  Laterality: N/A;   EUS N/A 05/29/2023   Procedure: UPPER ENDOSCOPIC ULTRASOUND (EUS) RADIAL;  Surgeon: Lemar Lofty., MD;  Location: [facility];  Service: Gastroenterology;  Laterality: N/A;   FINE NEEDLE ASPIRATION N/A 05/29/2023   Procedure: FINE NEEDLE ASPIRATION (FNA) LINEAR;  Surgeon: Lemar Lofty., MD;  Location: [facility]  ENDOSCOPY;  Service: Gastroenterology;  Laterality: N/A;   FINGER FRACTURE SURGERY Right    5th finger   KIDNEY STONE SURGERY     NASAL SINUS SURGERY  08/2009   OPEN ANTERIOR SHOULDER RECONSTRUCTION     UPPER GASTROINTESTINAL ENDOSCOPY     10 yrs ago    WISDOM TOOTH EXTRACTION     Patient Active Problem List   Diagnosis Date Noted   Coronary artery disease minimal disease based on coronary scheduled from 2024 05/13/2023   IDA (iron deficiency anemia) 04/04/2023   Palpitations 02/17/2023   Atypical chest pain 02/17/2023   Dyspnea on exertion 02/17/2023   Dyslipidemia 02/17/2023   Late effect of cerebrovascular accident (CVA) 02/17/2023   DDD (degenerative disc disease), cervical 03/24/2019   Right shoulder pain 02/24/2019   Cyst of scrotum 08/27/2016   Bladder pain 08/27/2016   Microcytosis 08/27/2016   Vitamin D deficiency 08/27/2016   Ethmoid sinusitis 08/27/2016   Kidney stone 08/21/2015    PCP: Marisue Brooklyn  REFERRING PROVIDER: Drake Leach, PA-C  REFERRING DIAG:  cervical spondylosis  THERAPY DIAG:  Cervical spondylosis  Decreased ROM of neck  Abnormal posture  Muscle weakness (generalized)  Rationale for Evaluation and Treatment: Rehabilitation  ONSET DATE: 8 months  SUBJECTIVE:  SUBJECTIVE STATEMENT: still pinching L upper traps, sore, doesn't let up much , creeping up into back of head some   Hand dominance: Right  PERTINENT HISTORY:   January kidney stones, then anemic, 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  Had L UE radicular Sx when cervical spine pain initiated and he saw neuro, now since he had blood transfusion for anemia now the radicular Sx are much better.  PAIN:  Are you having pain? Yes: NPRS scale: 0-2/10 Pain location: L  base of post neck upper traps Pain description: hurts with turning head L and with looking up Aggravating factors: shaving Relieving factors: avoiding neck extension  PRECAUTIONS: Other: chronic anemia  RED FLAGS: None     WEIGHT BEARING RESTRICTIONS: No  FALLS:  Has patient fallen in last 6 months? 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 At C3-4 there is a mild broad-based disc bulge with a left paracentral disc osteophyte complex. Bilateral uncovertebral degenerative changes, left greater than right. Severe left and moderate right foraminal stenosis. Mild spinal stenosis. 2. At C4-5 there is a mild broad-based disc bulge. Bilateral uncovertebral degenerative changes. Severe bilateral foraminal stenosis. Mild spinal stenosis. 3. At C5-6 there is a mild broad-based disc bulge. Bilateral uncovertebral degenerative changes. Moderate left and severe right foraminal stenosis. Mild spinal stenosis. 4. At C6-7 there is a mild broad-based disc bulge with a left paracentral disc extrusion. Moderate left foraminal stenosis. Moderate right foraminal stenosis. 5. No acute osseous injury of the cervical spine.     Electronically Signed   By: Elige Ko M.D.   On: 04/06/2023 15:38  PATIENT SURVEYS:  NDI 12/50 or 24%  COGNITION: Overall cognitive status: Within functional limits for tasks assessed  SENSATION: WFL  POSTURE:  sloped shoulders B and forward head at cervicothoracic junction  PALPATION: Normal lateral glides cervical spine, also upper cervical ROM with flex/rot test wnl, mild tenderness noted by pt L upper traps Very stiff, hypomobile  with segmental PA glides T 2 to T 8 vertebrae and corresponding ribs.   CERVICAL ROM:   Active ROM A/PROM (deg) eval  Flexion wfl  Extension 10 degrees  Right lateral flexion 50  Left lateral flexion 50  Right rotation 80  Left rotation 60   (Blank rows = not tested)  UPPER EXTREMITY ROM: all UE, shoulder ROM wnl B UPPER EXTREMITY MMT:  MMT Right eval Left eval  Shoulder flexion    Shoulder extension    Shoulder abduction    Shoulder adduction    Shoulder extension    Shoulder internal rotation    Shoulder external rotation    Middle trapezius 3 3+  Lower trapezius 3+ 3+  Elbow flexion    Elbow extension    Wrist flexion    Wrist extension    Wrist ulnar deviation    Wrist radial deviation    Wrist pronation    Wrist supination    Grip strength     (Blank rows = wfl)  CERVICAL SPECIAL TESTS:  Cranial cervical flexion test: Negative and Distraction test: Negative  FUNCTIONAL TESTS:    TODAY'S TREATMENT:   07/03/23: Manual techniques:  utilized the following skilled manual  techniques to address the patient's stiffness and pain cervical spine:  Supine for manual cervical traction/distraction ,  Manual stretching L upper traps, L levator scapulae with contract/relax, B upper cervical extension traction alternating with contract/relax Trigger Point Dry-Needling  Treatment instructions: Expect mild to moderate muscle soreness. S/S of pneumothorax if dry needled over a lung field, and to seek immediate medical attention should they occur. Patient verbalized understanding of these instructions and education. Patient Consent Given: Yes Education handout provided: No Muscles treated: L upper traps 2 pts, L C5 multifidus,   Treatment response/outcome: Twitch Response Elicited  Therex:  patient participated in the following ex to address his core/ spinal strength , hips, abdominals, periscapular BATCA    seated rows, hands horizontal 25# 3 x 12  chest press:      15# 3  x 12 Lat rows 45#, 3 x 12 B Leg press 35# 2 x 15 needed cues/ difficulty with eccentric lowering Seated with blue t band around knees for alt marching, mass practice  07/01/23: Manual techniques: sidelying B for rib recoil technique Supine for manual cervical traction/distraction ,  Manual stretching L upper traps with contract/relax Rotational PA glides on R for L rotation Prone for HVLA mob cervico thoracic jxn  BATCA    seated rows, hands horizontal 25# 3 x 12  chest press:      15# 3 x 12 Lat rows 45#, 3 x 12  Seated thoracic stretch with medium physioball at end of session 06/26/23: Therapeutic exercise: instructed and progressed in the following therex to improve strength and endurance for UE and LE musculature, and mobility, proximal strength for shoulders, thoracic spine: Nustep level 5 x  6 min Ue's and LE's BATCA    seated rows, hands horizontal 25# 3 x 12  chest press:      15# 3 x 12 Lat pulls 45# 3 x 12   Manual:  Supine for skilled manual techniques to address stiffness cervical spine:  Cervical manual traction Upper cervical rotation with neck flexed, contract/relax to improve cervical R rotation, 5 sec holds, 2 bouts Manual stretching L upper traps, with depression scapula Manual stretching L levator with bouts of isometric holds    BATCA    seated rows, hands horizontal 25#  chest press:      15# 3 x 15       Lat pull 35# 1 set 15 Lat pull 45# 2 sets 15 Leg press 45# 10 reps 3x10    Seated stretch for thoracic spine hands on 65 cm ball  Quadriped, thread the needle                                                                                                   DATE:   06/25/23  UBE L 2.5 x 6 min 3 fwd/3bwd ER with retraction x 20 Horizontal ABD green x 20 Open books x 5 Bil with prolonged hold at end with deep breaths Thread the needle quadriped to thoracic rotation x 5 B Seated paraspinal stretch on green physioball x 20 sec 3 way Seated traction with  rotation  x 5 Prone on elbows diagonals looking elbow to opp shoulder x 10 B Manual: Skilled palpation and monitoring of soft tissues during DN. STM to L UT, levator and cervical paraspinals. UPA and CPA mobs to mid and upper T and lower Cspines. Trigger Point Dry-Needling  Treatment instructions: Expect mild to moderate muscle soreness. S/S of pneumothorax if dry needled over a lung field, and to seek immediate medical attention should they occur. Patient verbalized understanding of these instructions and education. Patient Consent Given: Yes Education handout provided: Yes Muscles treated: L Levator scap, UT and cervical multifidi Electrical stimulation performed: No Parameters: N/A Treatment response/outcome: Twitch Response Elicited and Palpable Increase in Muscle Length    06/17/23  Prone for PA glides gr 3 transverse processes and post ribs 3 to 9  Therex:  self mobs for thoracic spine, lying on 1/2 foam roller , for pec stretch, with pec flies, also B shoulder flexion lat stretch with 4# cuff weight Short thoracic roller placed perpendicular to spine for segmental ext stretch  Instructed in home ex to strengthen middle traps, periscapular as in HEP below  PATIENT EDUCATION:  Education details: HEP update and DN rational, procedure, outcomes, potential side effects, and recommended post-treatment exercises/activity  Person educated: Patient Education method: Explanation, Demonstration, Tactile cues, and Verbal cues Education comprehension: verbalized understanding, returned demonstration, and verbal cues required  HOME EXERCISE PROGRAM: Access Code: [email or web address] URL: [email or web address] Date: 06/25/2023 Prepared by: Raynelle Fanning  Exercises - Shoulder External Rotation and Scapular Retraction with Resistance  - 1 x daily - 7 x weekly - 3 sets - 10 reps - Standing Shoulder Horizontal Abduction with Resistance  - 1 x daily - 7 x weekly - 3 sets - 10 reps - Sidelying  Thoracic Rotation with Open Book  - 1 x daily - 3-4 x weekly - 2 sets - 5 reps - Quadruped Thoracic Rotation - Reach Under  - 1 x daily - 3-4 x weekly - 1 sets - 10 reps - Cervical Rotation Prone on Elbows  - 1 x daily - 7 x weekly - 1 sets - 10 reps  - DN handout  ASSESSMENT:  CLINICAL IMPRESSION: Attending skilled PT due to stiffness cervical and thoracic spine and diffuse loss of muscle strength and endurance. Still consistent pain L upper traps region so resumed the dry needling again.   He is extending neck more easily with increased ROM noted , also better tolerance, less fatigue for strength Ue's with the [facility] machines.  Pain L upper traps region still an issue, may benefit from trial of cervical traction . OBJECTIVE IMPAIRMENTS: decreased ROM, decreased strength, hypomobility, increased fascial restrictions, impaired flexibility, improper body mechanics, postural dysfunction, and pain.   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   REHAB POTENTIAL: Good  CLINICAL DECISION MAKING: Stable/uncomplicated  EVALUATION COMPLEXITY: Low   GOALS: Goals reviewed with patient? Yes  SHORT TERM GOALS: Target date: 07/01/23  I HEP Baseline: initiated today Goal status: INITIAL  LONG TERM GOALS: Target date: 08/12/23: 8 weeks   NDI score: 2/50 Baseline: 12/50 Goal status: INITIAL  2.  Resolution of radicular Sx L UE Baseline:  Goal status: INITIAL  3.  Improve horizontal abduction strength( middle traps) to 5/5 with MMT for improve control of  periscapular musculature Baseline:  Goal status: INITIAL  4.  Improve ability(ROM) to extend cervical spine from 10% to 50% or  better Baseline:  Goal status: INITIAL    PLAN:  PT FREQUENCY: 2x/week  PT DURATION: 8 weeks  PLANNED INTERVENTIONS: Therapeutic exercises, Therapeutic activity, Neuromuscular re-education, Balance training, Gait training, Patient/Family education, Self Care, and Joint mobilization  PLAN FOR NEXT SESSION: recommend cardiovascular conditioning, strengthening ex for periscapular musculature, shoulders, manual techniques, jt mobs, TPDN as needed to manage pain/stiffness, add cervical traction?   Caralee Ates, PT , DPT, OCS 07/03/2023, 3:51 PM

## 2023-07-07 NOTE — Telephone Encounter (Signed)
CeraVe Anti Itch cream is a good start.

## 2023-07-08 ENCOUNTER — Ambulatory Visit: Payer: 59

## 2023-07-08 ENCOUNTER — Ambulatory Visit
Admission: RE | Admit: 2023-07-08 | Discharge: 2023-07-08 | Disposition: A | Discharge status: Home / Self Care | Payer: 59 | Source: Ambulatory Visit

## 2023-07-08 VITALS — BP 147/89 | HR 82 | Temp 98.2°F | Resp 16

## 2023-07-08 DIAGNOSIS — R059 Cough, unspecified: Secondary | ICD-10-CM | POA: Diagnosis not present

## 2023-07-08 DIAGNOSIS — J069 Acute upper respiratory infection, unspecified: Secondary | ICD-10-CM | POA: Diagnosis not present

## 2023-07-08 LAB — RESPIRATORY PANEL BY PCR

## 2023-07-08 LAB — POC SARS CORONAVIRUS 2 AG -  ED: SARS Coronavirus 2 Ag: NEGATIVE

## 2023-07-08 MED ORDER — HYDROCOD POLI-CHLORPHE POLI ER 10-8 MG/5ML PO SUER: 5.0000 mL | Freq: Two times a day (BID) | ORAL | 0 refills | Status: DC | PRN

## 2023-07-08 NOTE — ED Provider Notes (Signed)
[facility]    CSN: [id number] Arrival date & time: 07/08/23  1303      History   Chief Complaint Chief Complaint  Patient presents with   Cough    Entered by patient    HPI Tyler Obrien is a 62 y.o. male.   Pleasant 62 year old male with a known history of GIST tumor presents today due to a 4-day history of cough, congestion, body aches, fatigue.  States the cough is intermittently productive, but mostly dry.  Occurs all throughout the day, no changes with positions.  He denies any significant shortness of breath or wheezing, but he does report a slight pressure "like bronchitis".  He denies any chest pain or palpitations.  Denies any known sick contacts.  No recent travel.  He has been taking Zyrtec, Tylenol, Flonase with no improvement to his symptoms.  He has not taken any cough medications.  He is scheduled to have stromal tumor surgery soon and would like to be well for this.   Cough   Past Medical History:  Diagnosis Date   Allergy    Arthritis    Bleeding gastric ulcer    Blood transfusion without reported diagnosis    CVA (cerebral infarction) 2011   Diverticulosis    GERD (gastroesophageal reflux disease)    prilosec as needed- upset stomach mostly - started after gastric bleeding ulcer   Kidney stone    Stroke [facility]) 2011    Patient Active Problem List   Diagnosis Date Noted   Coronary artery disease minimal disease based on coronary scheduled from 2024 05/13/2023   IDA (iron deficiency anemia) 04/04/2023   Palpitations 02/17/2023   Atypical chest pain 02/17/2023   Dyspnea on exertion 02/17/2023   Dyslipidemia 02/17/2023   Late effect of cerebrovascular accident (CVA) 02/17/2023   DDD (degenerative disc disease), cervical 03/24/2019   Right shoulder pain 02/24/2019   Cyst of scrotum 08/27/2016   Bladder pain 08/27/2016   Microcytosis 08/27/2016   Vitamin D deficiency 08/27/2016   Ethmoid sinusitis 08/27/2016   Kidney stone 08/21/2015     Past Surgical History:  Procedure Laterality Date   APPENDECTOMY     BIOPSY  05/29/2023   Procedure: BIOPSY;  Surgeon: Lemar Lofty., MD;  Location: [facility];  Service: Gastroenterology;;   COLONOSCOPY     10 yrs ago    cyst removal from scrotum     cyst removed from elbow     CYSTOSCOPY/URETEROSCOPY/HOLMIUM LASER/STENT PLACEMENT Left 11/20/2022   Procedure: LEFT URETEROSCOPY/HOLMIUM LASER/STENT PLACEMENT;  Surgeon: Despina Arias, MD;  Location: [facility];  Service: Urology;  Laterality: Left;   ESOPHAGOGASTRODUODENOSCOPY N/A 05/29/2023   Procedure: ESOPHAGOGASTRODUODENOSCOPY (EGD);  Surgeon: Lemar Lofty., MD;  Location: [facility];  Service: Gastroenterology;  Laterality: N/A;   EUS N/A 05/29/2023   Procedure: UPPER ENDOSCOPIC ULTRASOUND (EUS) RADIAL;  Surgeon: Lemar Lofty., MD;  Location: [facility];  Service: Gastroenterology;  Laterality: N/A;   FINE NEEDLE ASPIRATION N/A 05/29/2023   Procedure: FINE NEEDLE ASPIRATION (FNA) LINEAR;  Surgeon: Lemar Lofty., MD;  Location: [facility];  Service: Gastroenterology;  Laterality: N/A;   FINGER FRACTURE SURGERY Right    5th finger   KIDNEY STONE SURGERY     NASAL SINUS SURGERY  08/2009   OPEN ANTERIOR SHOULDER RECONSTRUCTION     UPPER GASTROINTESTINAL ENDOSCOPY     10 yrs ago    WISDOM TOOTH EXTRACTION         Home Medications  Prior to Admission medications   Medication Sig Start Date End Date Taking? Authorizing Provider  cetirizine (ZYRTEC) 10 MG tablet Take 10 mg by mouth daily.   Yes [provider]  chlorpheniramine-HYDROcodone (TUSSIONEX) 10-8 MG/5ML Take 5 mLs by mouth every 12 (twelve) hours as needed for cough. 07/08/23  Yes Forestine Macho L, PA  Ascorbic Acid (VITAMIN C) 100 MG tablet Take 100 mg by mouth daily.    [provider]  aspirin EC 81 MG tablet Take 81 mg by mouth daily.    [provider]  azelastine (ASTELIN) 0.1 % nasal spray  Place 2 sprays into both nostrils 2 (two) times daily as needed for allergies or rhinitis. 07/20/22   [provider]  b complex vitamins tablet Take 1 tablet by mouth daily.    [provider]  Coenzyme Q10 (COQ-10 PO) Take 1 capsule by mouth daily. 100 mg    [provider]  Collagen Hydrolysate POWD Take 1 tablet by mouth daily at 6 (six) AM. Collagen powder    [provider]  diphenhydrAMINE (BENADRYL) 25 mg capsule Take 25 mg by mouth at bedtime as needed for sleep.    [provider]  fluticasone (FLONASE) 50 MCG/ACT nasal spray Place 2 sprays into both nostrils daily. 05/15/23   Eustace Moore, MD  guaiFENesin (MUCINEX) 600 MG 12 hr tablet Take 600 mg by mouth 2 (two) times daily as needed for cough or to loosen phlegm.    [provider]  Magnesium 250 MG TABS Take 250 mg by mouth daily at 6 (six) AM.    [provider]  omeprazole (PRILOSEC) 20 MG capsule Take 20 mg by mouth daily as needed (acid reflux).    [provider]  ondansetron (ZOFRAN-ODT) 4 MG disintegrating tablet Dissolve 1 tablet on the tongue every 6-8 hours as needed for nausea Patient not taking: Reported on 06/03/2023 05/21/23   Beverley Fiedler, MD  tamsulosin (FLOMAX) 0.4 MG CAPS capsule Take 0.4 mg by mouth.    [provider]    Family History Family History  Problem Relation Age of Onset   Cancer Mother    Endometrial cancer Mother    Cancer Father        Lung CA   Hyperlipidemia Father    Hypertension Father    Lung cancer Father    Stomach cancer Maternal Grandfather    Colon cancer Neg Hx    Colon polyps Neg Hx    Esophageal cancer Neg Hx    Rectal cancer Neg Hx     Social History Social History   Tobacco Use   Smoking status: Never   Smokeless tobacco: Former  Building services engineer status: Never Used  Substance Use Topics   Alcohol use: Not Currently   Drug use: No     Allergies   Nsaids, Lidocaine-menthol,  and Prednisone   Review of Systems Review of Systems  Respiratory:  Positive for cough.      Physical Exam Triage Vital Signs ED Triage Vitals  Encounter Vitals Group     BP 07/08/23 1314 (!) 147/89     Systolic BP Percentile --      Diastolic BP Percentile --      Pulse Rate 07/08/23 1314 82     Resp 07/08/23 1314 16     Temp 07/08/23 1314 98.2 F (36.8 C)     Temp Source 07/08/23 1314 Oral     SpO2 07/08/23 1314  96 %     Weight --      Height --      Head Circumference --      Peak Flow --      Pain Score 07/08/23 1316 4     Pain Loc --      Pain Education --      Exclude from Growth Chart --    No data found.  Updated Vital Signs BP (!) 147/89 (BP Location: Right Arm)   Pulse 82   Temp 98.2 F (36.8 C) (Oral)   Resp 16   SpO2 96%   Visual Acuity Right Eye Distance:   Left Eye Distance:   Bilateral Distance:    Right Eye Near:   Left Eye Near:    Bilateral Near:     Physical Exam Vitals and nursing note reviewed.  Constitutional:      General: He is not in acute distress.    Appearance: Normal appearance. He is normal weight. He is not ill-appearing, toxic-appearing or diaphoretic.  HENT:     Head: Normocephalic and atraumatic.     Right Ear: Tympanic membrane and ear canal normal. No drainage, swelling or tenderness. No middle ear effusion. Tympanic membrane is not erythematous.     Left Ear: Tympanic membrane and ear canal normal. No drainage, swelling or tenderness.  No middle ear effusion. Tympanic membrane is not erythematous.     Nose: No congestion or rhinorrhea.     Mouth/Throat:     Mouth: Mucous membranes are moist. No oral lesions.     Pharynx: Oropharynx is clear. No pharyngeal swelling, oropharyngeal exudate, posterior oropharyngeal erythema or uvula swelling.     Tonsils: No tonsillar exudate or tonsillar abscesses.  Eyes:     Extraocular Movements:     Left eye: Normal extraocular motion.     Conjunctiva/sclera: Conjunctivae normal.      Pupils: Pupils are equal, round, and reactive to light.  Neck:     Thyroid: No thyromegaly.  Cardiovascular:     Rate and Rhythm: Normal rate.     Heart sounds: Normal heart sounds. No murmur heard.    No friction rub. No gallop.  Pulmonary:     Effort: Pulmonary effort is normal. No respiratory distress.     Breath sounds: No stridor. Rhonchi (fine crackles noted to LUL with deep inspiration) present. No wheezing or rales.  Chest:     Chest wall: No tenderness.  Abdominal:     Palpations: Abdomen is soft.  Musculoskeletal:     Cervical back: Normal range of motion and neck supple.  Lymphadenopathy:     Cervical: No cervical adenopathy.  Skin:    General: Skin is warm.     Capillary Refill: Capillary refill takes less than 2 seconds.     Coloration: Skin is not pale.     Findings: No erythema or rash.  Neurological:     General: No focal deficit present.     Mental Status: He is alert.  Psychiatric:        Mood and Affect: Mood normal.        Behavior: Behavior normal.      UC Treatments / Results  Labs (all labs ordered are listed, but only abnormal results are displayed) Labs Reviewed  RESPIRATORY PANEL BY PCR  POC SARS CORONAVIRUS 2 AG -  ED    EKG   Radiology DG Chest 2 View  Result Date: 07/08/2023 CLINICAL DATA:  Cough. EXAM: CHEST - 2  VIEW COMPARISON:  June 11, 2023. FINDINGS: The heart size and mediastinal contours are within normal limits. Both lungs are clear. The visualized skeletal structures are unremarkable. IMPRESSION: No active cardiopulmonary disease. Electronically Signed   By: Lupita Raider M.D.   On: 07/08/2023 15:51    Procedures Procedures (including critical care time)  Medications Ordered in UC Medications - No data to display  Initial Impression / Assessment and Plan / UC Course  I have reviewed the triage vital signs and the nursing notes.  Pertinent labs & imaging results that were available during my care of the patient  were reviewed by me and considered in my medical decision making (see chart for details).     Acute URI -CXR negative for signs of pneumonia.  Vital signs appear stable.  COVID test in office is negative, will send out respiratory panel as patient has an upcoming surgical procedure.  Over-the-counter celecoxib recommended.  As needed Tussionex as needed for cough and discomfort. RTC precautions discussed.   Final Clinical Impressions(s) / UC Diagnoses   Final diagnoses:  Acute upper respiratory infection     Discharge Instructions      Your covid test is negative. We will send out the 20 panel respiratory swab to determine the cause of your symptoms. I am awaiting the results of your xray and will call if there are any acute findings. Please start taking the cough medication as needed, preferably at night as it can cause drowsiness. You can take OTC oscillococcinum which can help with fatigue and body aches. Please drink plenty of water and rest. If any new symptoms develop such as high fever or shortness of breath, please return for recheck.      ED Prescriptions     Medication Sig Dispense Auth. Provider   chlorpheniramine-HYDROcodone (TUSSIONEX) 10-8 MG/5ML Take 5 mLs by mouth every 12 (twelve) hours as needed for cough. 70 mL Shariq Puig L, PA      I have reviewed the PDMP during this encounter.   Maretta Bees, [place] 07/08/23 2043

## 2023-07-08 NOTE — ED Triage Notes (Signed)
Since Saturday night experiencing cough, PND, fatigue, generalized body aches. Taking zyrtec, flonase, and tylenol. No known sick exposure. Denies sore throat, fever, abdominal pain, N/V/D, CP, SOB

## 2023-07-08 NOTE — Discharge Instructions (Addendum)
Your covid test is negative. We will send out the 20 panel respiratory swab to determine the cause of your symptoms. I am awaiting the results of your xray and will call if there are any acute findings. Please start taking the cough medication as needed, preferably at night as it can cause drowsiness. You can take OTC oscillococcinum which can help with fatigue and body aches. Please drink plenty of water and rest. If any new symptoms develop such as high fever or shortness of breath, please return for recheck.

## 2023-07-11 ENCOUNTER — Other Ambulatory Visit: Payer: Self-pay | Admitting: General Surgery

## 2023-07-11 DIAGNOSIS — Z87442 Personal history of urinary calculi: Secondary | ICD-10-CM | POA: Insufficient documentation

## 2023-07-11 DIAGNOSIS — Z809 Family history of malignant neoplasm, unspecified: Secondary | ICD-10-CM | POA: Insufficient documentation

## 2023-07-11 DIAGNOSIS — C49A2 Gastrointestinal stromal tumor of stomach: Secondary | ICD-10-CM

## 2023-07-11 DIAGNOSIS — Z8673 Personal history of transient ischemic attack (TIA), and cerebral infarction without residual deficits: Secondary | ICD-10-CM | POA: Insufficient documentation

## 2023-07-17 ENCOUNTER — Ambulatory Visit: Payer: 59 | Admitting: Medical

## 2023-07-17 ENCOUNTER — Ambulatory Visit (HOSPITAL_BASED_OUTPATIENT_CLINIC_OR_DEPARTMENT_OTHER)
Admission: RE | Admit: 2023-07-17 | Discharge: 2023-07-17 | Disposition: A | Discharge status: Home / Self Care | Payer: 59 | Source: Ambulatory Visit | Attending: Medical | Admitting: Medical

## 2023-07-17 ENCOUNTER — Encounter: Payer: Self-pay | Admitting: Medical

## 2023-07-17 VITALS — BP 137/84 | HR 75 | Temp 98.8°F | Resp 16 | Wt 194.0 lb

## 2023-07-17 DIAGNOSIS — R0982 Postnasal drip: Secondary | ICD-10-CM

## 2023-07-17 DIAGNOSIS — J40 Bronchitis, not specified as acute or chronic: Secondary | ICD-10-CM | POA: Diagnosis not present

## 2023-07-17 DIAGNOSIS — R059 Cough, unspecified: Secondary | ICD-10-CM | POA: Diagnosis present

## 2023-07-17 LAB — CBC WITH DIFFERENTIAL/PLATELET
Basophils Absolute: 0.1 10*3/uL (ref 0.0–0.1)
Basophils Relative: 0.7 % (ref 0.0–3.0)
Eosinophils Absolute: 0.2 10*3/uL (ref 0.0–0.7)
Eosinophils Relative: 3.1 % (ref 0.0–5.0)
HCT: 50.9 % (ref 39.0–52.0)
Hemoglobin: 16.8 g/dL (ref 13.0–17.0)
Lymphocytes Relative: 29.9 % (ref 12.0–46.0)
Lymphs Abs: 2 10*3/uL (ref 0.7–4.0)
MCHC: 33.1 g/dL (ref 30.0–36.0)
MCV: 90.1 fL (ref 78.0–100.0)
Monocytes Absolute: 0.6 10*3/uL (ref 0.1–1.0)
Monocytes Relative: 9.5 % (ref 3.0–12.0)
Neutro Abs: 3.8 10*3/uL (ref 1.4–7.7)
Neutrophils Relative %: 56.8 % (ref 43.0–77.0)
Platelets: 184 10*3/uL (ref 150.0–400.0)
RBC: 5.65 Mil/uL (ref 4.22–5.81)
RDW: 15 % (ref 11.5–15.5)
WBC: 6.8 10*3/uL (ref 4.0–10.5)

## 2023-07-17 MED ORDER — ALBUTEROL SULFATE HFA 108 (90 BASE) MCG/ACT IN AERS: 2.0000 | INHALATION_SPRAY | Freq: Four times a day (QID) | RESPIRATORY_TRACT | 0 refills | Status: DC | PRN

## 2023-07-17 MED ORDER — MONTELUKAST SODIUM 10 MG PO TABS: 10.0000 mg | ORAL_TABLET | Freq: Every day | ORAL | 0 refills | Status: DC

## 2023-07-17 MED ORDER — HYDROCOD POLI-CHLORPHE POLI ER 10-8 MG/5ML PO SUER: ORAL | 0 refills | Status: DC

## 2023-07-17 MED ORDER — AZITHROMYCIN 250 MG PO TABS
ORAL_TABLET | ORAL | 0 refills | Status: AC
Start: 2023-07-17 — End: 2023-07-22

## 2023-07-17 NOTE — Progress Notes (Addendum)
Subjective:    Patient ID: Tyler Obrien, male    DOB: 07-Feb-1961, 62 y.o.   MRN: [id number]  HPI Pt in reporting cough for 2 weeks.  He states not getting better. He went to urgent care on 07-08-2023.    Below hpi from UC HPI Tyler Obrien is a 62 y.o. male.    Pleasant 62 year old male with a known history of GIST tumor presents today due to a 4-day history of cough, congestion, body aches, fatigue.  States the cough is intermittently productive, but mostly dry.  Occurs all throughout the day, no changes with positions.  He denies any significant shortness of breath or wheezing, but he does report a slight pressure "like bronchitis".  He denies any chest pain or palpitations.  Denies any known sick contacts.  No recent travel.  He has been taking Zyrtec, Tylenol, Flonase with no improvement to his symptoms.  He has not taken any cough medications.  He is scheduled to have stromal tumor surgery soon and would like to be well for this.  Pulmonary:     Effort: Pulmonary effort is normal. No respiratory distress.     Breath sounds: No stridor. Rhonchi (fine crackles noted to LUL with deep inspiration) present. No wheezing or rales.    Acute URI -CXR negative for signs of pneumonia.  Vital signs appear stable.  COVID test in office is negative, will send out respiratory panel as patient has an upcoming surgical procedure.  Over-the-counter celecoxib recommended.  As needed Tussionex as needed for cough and discomfort. RTC precautions discussed.      Pt states he is now getting productive cough but only occasional. Feels like mild wet cough. No fever, no chills or sweats. Some mild bodyaches the whole time.   Tussionex- pt state he can sleep with med. He was using this and has helped him sleep. Pt thinks UC provider thought about giving singulari.  No obvious wheezing. No hx of asthma. Only one time years ago use albuterol when was in [place].   Pt has upcoming  appointment for partial gastrectomy on 07-30-2023. So he wants to be better. Avoid cancellation. Preop appt this Friday.       Review of Systems  Constitutional:  Negative for fatigue.  HENT:  Positive for congestion and postnasal drip.   Respiratory:  Positive for cough. Negative for shortness of breath and wheezing.   Cardiovascular:  Negative for chest pain and palpitations.  Gastrointestinal:  Negative for abdominal pain.  Musculoskeletal:  Negative for back pain and myalgias.  Skin:  Negative for rash.  Neurological:  Negative for dizziness, weakness and light-headedness.  Hematological:  Negative for adenopathy. Does not bruise/bleed easily.  Psychiatric/Behavioral:  Negative for behavioral problems and decreased concentration.     Past Medical History:  Diagnosis Date   Allergy    Arthritis    Bleeding gastric ulcer    Blood transfusion without reported diagnosis    CVA (cerebral infarction) 2011   Diverticulosis    GERD (gastroesophageal reflux disease)    prilosec as needed- upset stomach mostly - started after gastric bleeding ulcer   Kidney stone    Stroke [facility]) 2011     Social History   Socioeconomic History   Marital status: Married    Spouse name: Not on file   Number of children: Not on file   Years of education: Not on file   Highest education level: Bachelor's degree (e.g., BA, AB, BS)  Occupational History  Not on file  Tobacco Use   Smoking status: Never   Smokeless tobacco: Former  Building services engineer status: Never Used  Substance and Sexual Activity   Alcohol use: Not Currently   Drug use: No   Sexual activity: Not Currently  Other Topics Concern   Not on file  Social History Narrative   Not on file   Social Determinants of Health   Financial Resource Strain: Low Risk  (01/20/2023)   Overall Financial Resource Strain (CARDIA)    Difficulty of Paying Living Expenses: Not hard at all  Food Insecurity: No Food Insecurity (04/04/2023)    Hunger Vital Sign    Worried About Running Out of Food in the Last Year: Never true    Ran Out of Food in the Last Year: Never true  Transportation Needs: No Transportation Needs (01/20/2023)   PRAPARE - Administrator, Civil Service (Medical): No    Lack of Transportation (Non-Medical): No  Physical Activity: Insufficiently Active (01/20/2023)   Exercise Vital Sign    Days of Exercise per Week: 1 day    Minutes of Exercise per Session: 20 min  Stress: Stress Concern Present (01/20/2023)   [employer] of Occupational Health - Occupational Stress Questionnaire    Feeling of Stress : To some extent  Social Connections: Socially Integrated (01/20/2023)   Social Connection and Isolation Panel [NHANES]    Frequency of Communication with Friends and Family: More than three times a week    Frequency of Social Gatherings with Friends and Family: Twice a week    Attends Religious Services: More than 4 times per year    Active Member of Clubs or Organizations: Yes    Attends Banker Meetings: More than 4 times per year    Marital Status: Married  Catering manager Violence: Not At Risk (04/04/2023)   Humiliation, Afraid, Rape, and Kick questionnaire    Fear of Current or Ex-Partner: No    Emotionally Abused: No    Physically Abused: No    Sexually Abused: No    Past Surgical History:  Procedure Laterality Date   APPENDECTOMY     BIOPSY  05/29/2023   Procedure: BIOPSY;  Surgeon: Lemar Lofty., MD;  Location: [facility];  Service: Gastroenterology;;   COLONOSCOPY     10 yrs ago    cyst removal from scrotum     cyst removed from elbow     CYSTOSCOPY/URETEROSCOPY/HOLMIUM LASER/STENT PLACEMENT Left 11/20/2022   Procedure: LEFT URETEROSCOPY/HOLMIUM LASER/STENT PLACEMENT;  Surgeon: Despina Arias, MD;  Location: [facility];  Service: Urology;  Laterality: Left;   ESOPHAGOGASTRODUODENOSCOPY N/A 05/29/2023   Procedure: ESOPHAGOGASTRODUODENOSCOPY (EGD);  Surgeon:  Lemar Lofty., MD;  Location: [facility];  Service: Gastroenterology;  Laterality: N/A;   EUS N/A 05/29/2023   Procedure: UPPER ENDOSCOPIC ULTRASOUND (EUS) RADIAL;  Surgeon: Lemar Lofty., MD;  Location: [facility];  Service: Gastroenterology;  Laterality: N/A;   FINE NEEDLE ASPIRATION N/A 05/29/2023   Procedure: FINE NEEDLE ASPIRATION (FNA) LINEAR;  Surgeon: Lemar Lofty., MD;  Location: [facility];  Service: Gastroenterology;  Laterality: N/A;   FINGER FRACTURE SURGERY Right    5th finger   KIDNEY STONE SURGERY     NASAL SINUS SURGERY  08/2009   OPEN ANTERIOR SHOULDER RECONSTRUCTION     UPPER GASTROINTESTINAL ENDOSCOPY     10 yrs ago    WISDOM TOOTH EXTRACTION      Family History  Problem Relation Age of Onset  Cancer Mother    Endometrial cancer Mother    Cancer Father        Lung CA   Hyperlipidemia Father    Hypertension Father    Lung cancer Father    Stomach cancer Maternal Grandfather    Colon cancer Neg Hx    Colon polyps Neg Hx    Esophageal cancer Neg Hx    Rectal cancer Neg Hx     Allergies  Allergen Reactions   Nsaids Other (See Comments)    BLEEDING/ULCER   Lidocaine-Menthol Rash   Prednisone Palpitations    Current Outpatient Medications on File Prior to Visit  Medication Sig Dispense Refill   aspirin EC 81 MG tablet Take 81 mg by mouth daily.     azelastine (ASTELIN) 0.1 % nasal spray Place 2 sprays into both nostrils 2 (two) times daily as needed for allergies or rhinitis.     b complex vitamins tablet Take 1 tablet by mouth daily.     cetirizine (ZYRTEC) 10 MG tablet Take 10 mg by mouth daily as needed for allergies.     chlorpheniramine-HYDROcodone (TUSSIONEX) 10-8 MG/5ML Take 5 mLs by mouth every 12 (twelve) hours as needed for cough. 70 mL 0   Coenzyme Q10 (COQ-10 PO) Take 1 capsule by mouth daily.     Collagen Hydrolysate POWD Take 1 Scoop by mouth daily at 6 (six) AM. Collagen powder     diphenhydrAMINE  (BENADRYL) 25 mg capsule Take 25-50 mg by mouth at bedtime as needed for sleep or allergies.     fluticasone (FLONASE) 50 MCG/ACT nasal spray Place 2 sprays into both nostrils daily. (Patient taking differently: Place 2 sprays into both nostrils daily as needed for allergies.) 16 g 0   gabapentin (NEURONTIN) 100 MG capsule Take 100 mg by mouth at bedtime as needed (pain).     guaiFENesin (MUCINEX) 600 MG 12 hr tablet Take 600 mg by mouth 2 (two) times daily as needed for cough or to loosen phlegm.     guaiFENesin-dextromethorphan (ROBITUSSIN DM) 100-10 MG/5ML syrup Take 5 mLs by mouth every 4 (four) hours as needed for cough.     Magnesium 250 MG TABS Take 250 mg by mouth daily.     omeprazole (PRILOSEC) 20 MG capsule Take 20 mg by mouth daily.     No current facility-administered medications on file prior to visit.    BP 137/84 (BP Location: Right Arm, Patient Position: Sitting, Cuff Size: Normal)   Pulse 75   Temp 98.8 F (37.1 C) (Oral)   Resp 16   Wt 194 lb (88 kg)   SpO2 96%   BMI 28.65 kg/m        Objective:   Physical Exam  General Mental Status- Alert. General Appearance- Not in acute distress.   Skin General: Color- Normal Color. Moisture- Normal Moisture.  Neck Carotid Arteries- Normal color. Moisture- Normal Moisture. No carotid bruits. No JVD.  Chest and Lung Exam Auscultation: Breath Sounds:-clear, even and unlabored.  Cardiovascular Auscultation:Rythm- RRR Murmurs & Other Heart Sounds:Auscultation of the heart reveals- No Murmurs.  Abdomen Inspection:-Inspeection Normal. Palpation/Percussion:Note:No mass. Palpation and Percussion of the abdomen reveal- Non Tender, Non Distended + BS, no rebound or guarding.   Neurologic Cranial Nerve exam:- CN III-XII intact(No nystagmus), symmetric smile. Strength:- 5/5 equal and symmetric strength both upper and lower extremities.    Lower ext- calfs symmetric, no pedal edema. Negative homans signs.      Assessment & Plan:   Patient Instructions  Cough for 2 weeks.  At this point considering bronchitis.  -About 10 days ago chest x-ray was negative and 20 panel respiratory studies all negative.  However some recent reported intermittent productive cough and upcoming surgery on October 23.  Preop appointment next Friday. -At this point think best to go ahead and prescribe azithromycin antibiotic, get chest x-ray and follow infection fighting cells. -Prescribe Tussionex to use as needed.  Rx advisement given.  Would recommend limited use around Thursday prior to your preop appointment. - DG Chest 2 View; Future - CBC w/Diff  Post nasal drainage-concern for allergic rhinitis component to cough -Can continue Flonase and adding on montelukast 10 mg to use at night.  Overall differential diagnoses for cough discussed.  Making albuterol available if you were to note any wheezing.  Also if you notice any belching with eating or any reflux then recommend getting famotidine/pepcid over-the-counter.  Hopefully your surgery will be delayed.  Follow-up date to be determined after lab and imaging review.      Esperanza Richters, PA-C

## 2023-07-17 NOTE — Telephone Encounter (Signed)
Pt called and appt moved.   

## 2023-07-17 NOTE — Patient Instructions (Addendum)
Cough for 2 weeks.  At this point considering bronchitis.  -About 10 days ago chest x-ray was negative and 20 panel respiratory studies all negative.  However some recent reported intermittent productive cough and upcoming surgery on October 23.  Preop appointment next Friday. -At this point think best to go ahead and prescribe azithromycin antibiotic, get chest x-ray and follow infection fighting cells. -Prescribe Tussionex to use as needed.  Rx advisement given.  Would recommend limited use around Thursday prior to your preop appointment. - DG Chest 2 View; Future - CBC w/Diff  Post nasal drainage-concern for allergic rhinitis component to cough -Can continue Flonase and adding on montelukast 10 mg to use at night.  Overall differential diagnoses for cough discussed.  Making albuterol available if you were to note any wheezing.  Also if you notice any belching with eating or any reflux then recommend getting famotidine/pepcid over-the-counter.  Hopefully your surgery will be delayed.  Follow-up date to be determined after lab and imaging review.

## 2023-07-18 ENCOUNTER — Ambulatory Visit: Payer: 59 | Admitting: Medical

## 2023-07-22 ENCOUNTER — Ambulatory Visit: Payer: 59 | Admitting: Orthopedic Surgery

## 2023-07-23 ENCOUNTER — Encounter: Payer: Self-pay | Admitting: Medical Oncology

## 2023-07-23 ENCOUNTER — Inpatient Hospital Stay (HOSPITAL_BASED_OUTPATIENT_CLINIC_OR_DEPARTMENT_OTHER): Payer: 59 | Admitting: Medical Oncology

## 2023-07-23 ENCOUNTER — Other Ambulatory Visit: Payer: Self-pay

## 2023-07-23 ENCOUNTER — Inpatient Hospital Stay: Payer: 59 | Attending: Family

## 2023-07-23 VITALS — BP 119/87 | HR 81 | Temp 97.9°F | Resp 18 | Ht 69.0 in | Wt 193.0 lb

## 2023-07-23 DIAGNOSIS — Z79899 Other long term (current) drug therapy: Secondary | ICD-10-CM | POA: Diagnosis not present

## 2023-07-23 DIAGNOSIS — C49A0 Gastrointestinal stromal tumor, unspecified site: Secondary | ICD-10-CM | POA: Diagnosis present

## 2023-07-23 DIAGNOSIS — Z888 Allergy status to other drugs, medicaments and biological substances status: Secondary | ICD-10-CM | POA: Diagnosis not present

## 2023-07-23 DIAGNOSIS — R000 Tachycardia, unspecified: Secondary | ICD-10-CM | POA: Diagnosis not present

## 2023-07-23 DIAGNOSIS — Z886 Allergy status to analgesic agent status: Secondary | ICD-10-CM | POA: Insufficient documentation

## 2023-07-23 DIAGNOSIS — D509 Iron deficiency anemia, unspecified: Secondary | ICD-10-CM | POA: Diagnosis not present

## 2023-07-23 DIAGNOSIS — R202 Paresthesia of skin: Secondary | ICD-10-CM | POA: Diagnosis not present

## 2023-07-23 DIAGNOSIS — R5383 Other fatigue: Secondary | ICD-10-CM | POA: Diagnosis not present

## 2023-07-23 LAB — CBC WITH DIFFERENTIAL (CANCER CENTER ONLY)
Abs Immature Granulocytes: 0.04 10*3/uL (ref 0.00–0.07)
Basophils Absolute: 0.1 10*3/uL (ref 0.0–0.1)
Basophils Relative: 1 %
Eosinophils Absolute: 0.3 10*3/uL (ref 0.0–0.5)
Eosinophils Relative: 5 %
HCT: 49.6 % (ref 39.0–52.0)
Hemoglobin: 17 g/dL (ref 13.0–17.0)
Immature Granulocytes: 1 %
Lymphocytes Relative: 42 %
Lymphs Abs: 2.5 10*3/uL (ref 0.7–4.0)
MCH: 30.3 pg (ref 26.0–34.0)
MCHC: 34.3 g/dL (ref 30.0–36.0)
MCV: 88.4 fL (ref 80.0–100.0)
Monocytes Absolute: 0.5 10*3/uL (ref 0.1–1.0)
Monocytes Relative: 9 %
Neutro Abs: 2.4 10*3/uL (ref 1.7–7.7)
Neutrophils Relative %: 42 %
Platelet Count: 175 10*3/uL (ref 150–400)
RBC: 5.61 MIL/uL (ref 4.22–5.81)
RDW: 13.5 % (ref 11.5–15.5)
WBC Count: 5.9 10*3/uL (ref 4.0–10.5)
nRBC: 0 % (ref 0.0–0.2)

## 2023-07-23 LAB — IRON AND IRON BINDING CAPACITY (CC-WL,HP ONLY)
Iron: 146 ug/dL (ref 45–182)
Saturation Ratios: 50 % — ABNORMAL HIGH (ref 17.9–39.5)
TIBC: 290 ug/dL (ref 250–450)
UIBC: 144 ug/dL (ref 117–376)

## 2023-07-23 LAB — FERRITIN: Ferritin: 199 ng/mL (ref 24–336)

## 2023-07-23 LAB — RETICULOCYTES
Immature Retic Fract: 3.5 % (ref 2.3–15.9)
RBC.: 5.64 MIL/uL (ref 4.22–5.81)
Retic Count, Absolute: 54.1 10*3/uL (ref 19.0–186.0)
Retic Ct Pct: 1 % (ref 0.4–3.1)

## 2023-07-23 NOTE — Progress Notes (Signed)
Hematology and Oncology Follow Up Visit  Tyler Obrien [id number] 1961/06/24 62 y.o. 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   He is currently on the tail end of a viral illness.   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 He is followed by Dr. Meridee Score for this. Surgery is set for next Wednesday.   No blood loss noted. No bruising or petechiae. He reports that he tolerated the iron infusions well. They helped his fatigue, nerve pain and racing heart sensation.  No fever, chills, n/v, cough, rash, dizziness, SOB, chest pain or changes in bladder habits.  No swelling in his extremities.  He has occasional tingling in his left arm and sides of his feet.  No falls or syncope reported.  Appetite is down over the last week due to abdominal discomfort. He is doing his best to stay well hydrated.  Wt Readings from Last 3 Encounters:  07/23/23 193 lb 0.6 oz (87.6 kg)  07/17/23 194 lb (88 kg)  06/24/23 195 lb 6.4 oz (88.6 kg)   ECOG Performance Status: 1 - Symptomatic but completely ambulatory  Medications:  Allergies as of 07/23/2023       Reactions   Nsaids Other (See Comments)   BLEEDING/ULCER   Lidocaine-menthol Rash   Only the patches   Prednisone Palpitations        Medication List        Accurate as of July 23, 2023 10:33 AM. If you have any questions, ask your nurse or doctor.          albuterol 108 (90 Base) MCG/ACT inhaler Commonly known as: VENTOLIN HFA Inhale 2 puffs into the lungs every 6 (six) hours as needed.   aspirin EC 81 MG tablet Take 81 mg by mouth daily.   azelastine 0.1 % nasal spray Commonly known as: ASTELIN Place 2 sprays into both nostrils 2 (two) times daily as needed for allergies or rhinitis.   b complex vitamins tablet Take  1 tablet by mouth daily.   cetirizine 10 MG tablet Commonly known as: ZYRTEC Take 10 mg by mouth daily as needed for allergies.   chlorpheniramine-HYDROcodone 10-8 MG/5ML Commonly known as: TUSSIONEX Take 5 mLs by mouth every 12 (twelve) hours as needed for cough.   chlorpheniramine-HYDROcodone 10-8 MG/5ML Commonly known as: TUSSIONEX 5 ml po q 12 hours prn cough   Collagen Hydrolysate Powd Take 1 Scoop by mouth daily at 6 (six) AM. Collagen powder   COQ-10 PO Take 1 capsule by mouth daily.   diphenhydrAMINE 25 mg capsule Commonly known as: BENADRYL Take 25-50 mg by mouth at bedtime as needed for sleep or allergies.   fluticasone 50 MCG/ACT nasal spray Commonly known as: FLONASE Place 2 sprays into both nostrils daily. What changed:  when to take this reasons to take this   gabapentin 100 MG capsule Commonly known as: NEURONTIN Take 100 mg by mouth at bedtime as needed (pain).   guaiFENesin 600 MG 12 hr tablet Commonly known as: MUCINEX Take 600 mg by mouth 2 (two) times daily as needed for cough or to loosen phlegm.   guaiFENesin-dextromethorphan 100-10 MG/5ML syrup Commonly known as: ROBITUSSIN DM Take 5 mLs by mouth every 4 (four) hours as needed for cough.   Magnesium 250 MG Tabs Take 250  mg by mouth daily.   montelukast 10 MG tablet Commonly known as: SINGULAIR Take 1 tablet (10 mg total) by mouth at bedtime.   omeprazole 20 MG capsule Commonly known as: PRILOSEC Take 20 mg by mouth daily.        Allergies:  Allergies  Allergen Reactions   Nsaids Other (See Comments)    BLEEDING/ULCER   Lidocaine-Menthol Rash    Only the patches   Prednisone Palpitations    Past Medical History, Surgical history, Social history, and Family History were reviewed and updated.  Review of Systems: All other 10 point review of systems is negative.   Physical Exam:  height is 5\' 9"  (1.753 m) and weight is 193 lb 0.6 oz (87.6 kg). His oral temperature is 97.9 F  (36.6 C). His blood pressure is 119/87 and his pulse is 81. His respiration is 18 and oxygen saturation is 97%.   Wt Readings from Last 3 Encounters:  07/23/23 193 lb 0.6 oz (87.6 kg)  07/17/23 194 lb (88 kg)  06/24/23 195 lb 6.4 oz (88.6 kg)    Ocular: Sclerae unicteric, pupils equal, round and reactive to light Ear-nose-throat: Oropharynx clear, dentition fair Lymphatic: No cervical or supraclavicular adenopathy Lungs no rales or rhonchi, good excursion bilaterally Heart regular rate and rhythm, no murmur appreciated Abd soft, nontender, positive bowel sounds MSK no focal spinal tenderness, no joint edema Neuro: non-focal, well-oriented, appropriate affect   Lab Results  Component Value Date   WBC 5.9 07/23/2023   HGB 17.0 07/23/2023   HCT 49.6 07/23/2023   MCV 88.4 07/23/2023   PLT 175 07/23/2023   Lab Results  Component Value Date   FERRITIN 244 06/03/2023   IRON 110 06/03/2023   TIBC 274 06/03/2023   UIBC 164 06/03/2023   IRONPCTSAT 40 (H) 06/03/2023   Lab Results  Component Value Date   RETICCTPCT 1.0 07/23/2023   RBC 5.61 07/23/2023   No results found for: "KPAFRELGTCHN", "LAMBDASER", "KAPLAMBRATIO" No results found for: "IGGSERUM", "IGA", "IGMSERUM" No results found for: "TOTALPROTELP", "ALBUMINELP", "A1GS", "A2GS", "BETS", "BETA2SER", "GAMS", "MSPIKE", "SPEI"   Chemistry      Component Value Date/Time   NA 141 02/25/2023 1436   K 4.5 02/25/2023 1436   CL 104 02/25/2023 1436   CO2 23 02/25/2023 1436   BUN 18 02/25/2023 1436   CREATININE 1.03 02/25/2023 1436   CREATININE 1.37 (H) 09/10/2018 0844      Component Value Date/Time   CALCIUM 9.5 02/25/2023 1436   ALKPHOS 56 01/06/2023 0749   AST 24 01/06/2023 0749   ALT 22 01/06/2023 0749   BILITOT 0.3 01/06/2023 0749      Encounter Diagnosis  Name Primary?   Iron deficiency anemia, unspecified iron deficiency anemia type Yes    Impression and Plan: Mr. Vise is a pleasant 62 yo gentleman with  recent diagnosis of IDA and GIST. Currently on PRN IV iron.   Today his Hgb is 17 with MCV of 88.4.  Iron studies are pending. Will replace if needed.  RTC 2 months APP, lab -[place]   Rushie Chestnut, PA-C 10/16/202410:33 AM

## 2023-07-24 NOTE — Pre-Procedure Instructions (Signed)
Surgical Instructions    Your procedure is scheduled on July 30, 2023.  Report to [facility] Main Entrance "A" at 11:45 A.M., then check in with the Admitting office.  Call this number if you have problems the morning of surgery:  [phone]   If you have any questions prior to your surgery date call [phone]: Open Monday-Friday 8am-4pm If you experience any cold or flu symptoms such as cough, fever, chills, shortness of breath, etc. between now and your scheduled surgery, please notify us at the above number     Remember:  Do not eat after midnight the night before your surgery  You may drink clear liquids until 10:45AM the morning of your surgery.   Clear liquids allowed are: Water, Non-Citrus Juices (without pulp), Carbonated Beverages, Clear Tea, Black Coffee ONLY (NO MILK, CREAM OR POWDERED CREAMER of any kind), and Gatorade    Take these medicines the morning of surgery with A SIP OF WATER:  omeprazole (PRILOSEC)   If needed: albuterol (VENTOLIN HFA) 108 (90 Base) MCG/ACT inhaler - Please bring all inhalers with you the day of surgery.  azelastine (ASTELIN) 0.1 % nasal spray  cetirizine (ZYRTEC)  diphenhydrAMINE (BENADRYL)  fluticasone (FLONASE) 50 MCG/ACT nasal spray   Follow your surgeon's instructions regarding stopping Aspirin. If no instructions were given, please contact you surgeon's office.   As of today, STOP taking any Aleve, Naproxen, Ibuprofen, Motrin, Advil, Goody's, BC's, all herbal medications, fish oil, and all vitamins.  [facility] is not responsible for any belongings or valuables.    Do NOT Smoke (Tobacco/Vaping)  24 hours prior to your procedure  If you use a CPAP at night, you may bring your mask for your overnight stay.   Contacts, glasses, hearing aids, dentures or partials may not be worn into surgery, please bring cases for these belongings   For patients admitted to the hospital, discharge time will be determined by your treatment  team.   Patients discharged the day of surgery will not be allowed to drive home, and someone needs to stay with them for 24 hours.   SURGICAL WAITING ROOM VISITATION Patients having surgery or a procedure may have no more than 2 support people in the waiting area - these visitors may rotate.   Children under the age of 65 must have an adult with them who is not the patient. If the patient needs to stay at the hospital during part of their recovery, the visitor guidelines for inpatient rooms apply. Pre-op nurse will coordinate an appropriate time for 1 support person to accompany patient in pre-op.  This support person may not rotate.   Please refer to [email or web address] for the visitor guidelines for Inpatients (after your surgery is over and you are in a regular room).    Special instructions:    Oral Hygiene is also important to reduce your risk of infection.  Remember - BRUSH YOUR TEETH THE MORNING OF SURGERY WITH YOUR REGULAR TOOTHPASTE   [facility]- Preparing For Surgery  Before surgery, you can play an important role. Because skin is not sterile, your skin needs to be as free of germs as possible. You can reduce the number of germs on your skin by washing with CHG (chlorahexidine gluconate) Soap before surgery.  CHG is an antiseptic cleaner which kills germs and bonds with the skin to continue killing germs even after washing.     Please do not use if you have an allergy to CHG or antibacterial soaps. If your  skin becomes reddened/irritated stop using the CHG.  Do not shave (including legs and underarms) for at least 48 hours prior to first CHG shower. It is OK to shave your face.  Please follow these instructions carefully.     Shower the NIGHT BEFORE SURGERY and the MORNING OF SURGERY with CHG Soap.   If you chose to wash your hair, wash your hair first as usual with your normal shampoo. After you shampoo, rinse your hair and  body thoroughly to remove the shampoo.  Then [employer] and genitals (private parts) with your normal soap and rinse thoroughly to remove soap.  After that Use CHG Soap as you would any other liquid soap. You can apply CHG directly to the skin and wash gently with a scrungie or a clean washcloth.   Apply the CHG Soap to your body ONLY FROM THE NECK DOWN.  Do not use on open wounds or open sores. Avoid contact with your eyes, ears, mouth and genitals (private parts). Wash Face and genitals (private parts)  with your normal soap.   Wash thoroughly, paying special attention to the area where your surgery will be performed.  Thoroughly rinse your body with warm water from the neck down.  DO NOT shower/wash with your normal soap after using and rinsing off the CHG Soap.  Pat yourself dry with a CLEAN TOWEL.  Wear CLEAN PAJAMAS to bed the night before surgery  Place CLEAN SHEETS on your bed the night before your surgery  DO NOT SLEEP WITH PETS.   Day of Surgery:  Take a shower with CHG soap. Wear Clean/Comfortable clothing the morning of surgery Do not wear jewelry or makeup. Do not wear lotions, powders, perfumes/cologne or deodorant. Do not shave 48 hours prior to surgery.  Men may shave face and neck. Do not bring valuables to the hospital. Do not wear nail polish, gel polish, artificial nails, or any other type of covering on natural nails (fingers and toes) If you have artificial nails or gel coating that need to be removed by a nail salon, please have this removed prior to surgery. Artificial nails or gel coating may interfere with anesthesia's ability to adequately monitor your vital signs.   Remember to brush your teeth WITH YOUR REGULAR TOOTHPASTE.    If you received a COVID test during your pre-op visit, it is requested that you wear a mask when out in public, stay away from anyone that may not be feeling well, and notify your surgeon if you develop symptoms. If you have been in  contact with anyone that has tested positive in the last 10 days, please notify your surgeon.    Please read over the following fact sheets that you were given.

## 2023-07-25 ENCOUNTER — Encounter (HOSPITAL_COMMUNITY)
Admission: RE | Admit: 2023-07-25 | Discharge: 2023-07-25 | Disposition: A | Discharge status: Home / Self Care | Payer: 59 | Source: Ambulatory Visit | Attending: General Surgery | Admitting: General Surgery

## 2023-07-25 ENCOUNTER — Other Ambulatory Visit: Payer: Self-pay

## 2023-07-25 ENCOUNTER — Encounter (HOSPITAL_COMMUNITY): Payer: Self-pay

## 2023-07-25 DIAGNOSIS — C49A2 Gastrointestinal stromal tumor of stomach: Secondary | ICD-10-CM

## 2023-07-25 HISTORY — DX: Other complications of anesthesia, initial encounter: T88.59XA

## 2023-07-25 HISTORY — DX: Concussion with loss of consciousness status unknown, initial encounter: S06.0XAA

## 2023-07-25 HISTORY — DX: Anemia, unspecified: D64.9

## 2023-07-25 HISTORY — DX: Personal history of urinary calculi: Z87.442

## 2023-07-25 LAB — BASIC METABOLIC PANEL
Anion gap: 8 (ref 5–15)
BUN: 15 mg/dL (ref 8–23)
CO2: 27 mmol/L (ref 22–32)
Calcium: 9.1 mg/dL (ref 8.9–10.3)
Chloride: 103 mmol/L (ref 98–111)
Creatinine, Ser: 1.12 mg/dL (ref 0.61–1.24)
GFR, Estimated: >60 mL/min (ref >60)
Glucose, Bld: 92 mg/dL (ref 70–99)
Potassium: 4.4 mmol/L (ref 3.5–5.1)
Sodium: 138 mmol/L (ref 135–145)

## 2023-07-25 NOTE — Progress Notes (Signed)
PCP - Esperanza Richters, PA-C  Cardiologist - no  EP-no  Endocrine-no  Pulm-no  GI- Dr. Rhea Belton  Hematologist-  Clent Jacks, PA-C  Chest x-ray - 02/17/23  EKG - 07/17/23  Stress Test -   ECHO - 02/20/23  Cardiac Cath - no  AICD-no PM-no LOOP-no  Nerve Stimulator-no  Sleep Study - many years ago-  CPAP - no  LABS-BMP= 07/25/23  CBC- 07/23/23  ASA- hold 5 days    ERAS-yes -   HA1C-na GLP-1-no Fasting Blood Sugar - na Checks Blood Sugar ___0_ times a day  Anesthesia-  Pt denies having chest pain, sob, or fever at this time. All instructions explained to the pt, with a verbal understanding of the material. Pt agrees to go over the instructions while at home for a better understanding. The opportunity to ask questions was provided.

## 2023-07-25 NOTE — Plan of Care (Signed)
CHL Tonsillectomy/Adenoidectomy, Postoperative PEDS care plan entered in error.

## 2023-07-29 NOTE — Plan of Care (Signed)
CHL Tonsillectomy/Adenoidectomy, Postoperative PEDS care plan entered in error.

## 2023-07-29 NOTE — Progress Notes (Signed)
patient voiced understanding of arrival time of 1100 tomorrow and clear liquids okay until 1000

## 2023-07-30 ENCOUNTER — Other Ambulatory Visit: Payer: Self-pay

## 2023-07-30 ENCOUNTER — Encounter (HOSPITAL_COMMUNITY): Payer: Self-pay | Admitting: General Surgery

## 2023-07-30 ENCOUNTER — Ambulatory Visit (HOSPITAL_COMMUNITY): Payer: 59 | Admitting: Certified Registered"

## 2023-07-30 ENCOUNTER — Ambulatory Visit (HOSPITAL_COMMUNITY)
Admission: RE | Admit: 2023-07-30 | Discharge: 2023-08-01 | Disposition: A | Discharge status: Home / Self Care | Payer: 59 | Attending: General Surgery | Admitting: General Surgery

## 2023-07-30 ENCOUNTER — Encounter (HOSPITAL_COMMUNITY)
Admission: RE | Disposition: A | Discharge status: Home / Self Care | Payer: Self-pay | Source: Home / Self Care | Attending: General Surgery

## 2023-07-30 ENCOUNTER — Ambulatory Visit (HOSPITAL_BASED_OUTPATIENT_CLINIC_OR_DEPARTMENT_OTHER): Payer: 59 | Admitting: Certified Registered"

## 2023-07-30 DIAGNOSIS — Z87442 Personal history of urinary calculi: Secondary | ICD-10-CM | POA: Insufficient documentation

## 2023-07-30 DIAGNOSIS — Z8049 Family history of malignant neoplasm of other genital organs: Secondary | ICD-10-CM | POA: Insufficient documentation

## 2023-07-30 DIAGNOSIS — C49A2 Gastrointestinal stromal tumor of stomach: Secondary | ICD-10-CM | POA: Diagnosis present

## 2023-07-30 DIAGNOSIS — Z87891 Personal history of nicotine dependence: Secondary | ICD-10-CM | POA: Insufficient documentation

## 2023-07-30 DIAGNOSIS — Z8673 Personal history of transient ischemic attack (TIA), and cerebral infarction without residual deficits: Secondary | ICD-10-CM | POA: Insufficient documentation

## 2023-07-30 DIAGNOSIS — Z800 Family history of malignant neoplasm of digestive organs: Secondary | ICD-10-CM | POA: Insufficient documentation

## 2023-07-30 DIAGNOSIS — K219 Gastro-esophageal reflux disease without esophagitis: Secondary | ICD-10-CM | POA: Diagnosis not present

## 2023-07-30 DIAGNOSIS — Z9049 Acquired absence of other specified parts of digestive tract: Secondary | ICD-10-CM | POA: Insufficient documentation

## 2023-07-30 DIAGNOSIS — Z801 Family history of malignant neoplasm of trachea, bronchus and lung: Secondary | ICD-10-CM | POA: Diagnosis not present

## 2023-07-30 DIAGNOSIS — Z8249 Family history of ischemic heart disease and other diseases of the circulatory system: Secondary | ICD-10-CM | POA: Diagnosis not present

## 2023-07-30 DIAGNOSIS — Z8711 Personal history of peptic ulcer disease: Secondary | ICD-10-CM | POA: Insufficient documentation

## 2023-07-30 HISTORY — PX: LAPAROSCOPIC GASTRECTOMY: SHX5894

## 2023-07-30 SURGERY — GASTRECTOMY, LAPAROSCOPIC
Anesthesia: General

## 2023-07-30 MED ORDER — METHOCARBAMOL 1000 MG/10ML IJ SOLN
500.0000 mg | Freq: Four times a day (QID) | INTRAMUSCULAR | Status: DC | PRN
  Administered 2023-07-31 (×2): 500 mg via INTRAVENOUS
  Filled 2023-07-30 (×2): qty 10

## 2023-07-30 MED ORDER — HYDROMORPHONE HCL 1 MG/ML IJ SOLN
0.2500 mg | INTRAMUSCULAR | Status: DC | PRN
  Administered 2023-07-30 (×2): 0.5 mg via INTRAVENOUS

## 2023-07-30 MED ORDER — ONDANSETRON HCL 4 MG/2ML IJ SOLN
INTRAMUSCULAR | Status: DC | PRN
  Administered 2023-07-30: 4 mg via INTRAVENOUS

## 2023-07-30 MED ORDER — LABETALOL HCL 5 MG/ML IV SOLN
INTRAVENOUS | Status: DC | PRN
  Administered 2023-07-30 (×2): 5 mg via INTRAVENOUS

## 2023-07-30 MED ORDER — PHENYLEPHRINE HCL-NACL 20-0.9 MG/250ML-% IV SOLN
INTRAVENOUS | Status: DC | PRN
  Administered 2023-07-30: 50 ug/min via INTRAVENOUS

## 2023-07-30 MED ORDER — BUPIVACAINE LIPOSOME 1.3 % IJ SUSP
INTRAMUSCULAR | Status: AC
  Filled 2023-07-30: qty 20

## 2023-07-30 MED ORDER — PHENYLEPHRINE 80 MCG/ML (10ML) SYRINGE FOR IV PUSH (FOR BLOOD PRESSURE SUPPORT)
PREFILLED_SYRINGE | INTRAVENOUS | Status: AC
  Filled 2023-07-30: qty 10

## 2023-07-30 MED ORDER — PROCHLORPERAZINE MALEATE 10 MG PO TABS: 10.0000 mg | ORAL_TABLET | Freq: Four times a day (QID) | ORAL | Status: DC | PRN

## 2023-07-30 MED ORDER — LACTATED RINGERS IV SOLN: INTRAVENOUS | Status: DC | PRN

## 2023-07-30 MED ORDER — ACETAMINOPHEN 500 MG PO TABS
1000.0000 mg | ORAL_TABLET | ORAL | Status: AC
  Administered 2023-07-30: 1000 mg via ORAL
  Filled 2023-07-30: qty 2

## 2023-07-30 MED ORDER — MONTELUKAST SODIUM 10 MG PO TABS
10.0000 mg | ORAL_TABLET | Freq: Every day | ORAL | Status: DC
  Administered 2023-07-30 – 2023-07-31 (×2): 10 mg via ORAL
  Filled 2023-07-30 (×2): qty 1

## 2023-07-30 MED ORDER — LIDOCAINE 2% (20 MG/ML) 5 ML SYRINGE
INTRAMUSCULAR | Status: DC | PRN
  Administered 2023-07-30: 100 mg via INTRAVENOUS

## 2023-07-30 MED ORDER — PROCHLORPERAZINE EDISYLATE 10 MG/2ML IJ SOLN: 5.0000 mg | Freq: Four times a day (QID) | INTRAMUSCULAR | Status: DC | PRN

## 2023-07-30 MED ORDER — KCL IN DEXTROSE-NACL 20-5-0.45 MEQ/L-%-% IV SOLN
INTRAVENOUS | Status: DC
  Filled 2023-07-30: qty 1000

## 2023-07-30 MED ORDER — FENTANYL CITRATE (PF) 250 MCG/5ML IJ SOLN
INTRAMUSCULAR | Status: DC | PRN
  Administered 2023-07-30 (×2): 100 ug via INTRAVENOUS
  Administered 2023-07-30: 50 ug via INTRAVENOUS

## 2023-07-30 MED ORDER — CEFAZOLIN SODIUM-DEXTROSE 2-4 GM/100ML-% IV SOLN
2.0000 g | Freq: Three times a day (TID) | INTRAVENOUS | Status: AC
  Administered 2023-07-30: 2 g via INTRAVENOUS
  Filled 2023-07-30: qty 100

## 2023-07-30 MED ORDER — CHLORHEXIDINE GLUCONATE CLOTH 2 % EX PADS: 6.0000 | MEDICATED_PAD | Freq: Once | CUTANEOUS | Status: DC

## 2023-07-30 MED ORDER — FLUTICASONE PROPIONATE 50 MCG/ACT NA SUSP: 2.0000 | Freq: Every day | NASAL | Status: DC | PRN

## 2023-07-30 MED ORDER — CHLORHEXIDINE GLUCONATE 0.12 % MT SOLN
OROMUCOSAL | Status: AC
  Administered 2023-07-30: 15 mL
  Filled 2023-07-30: qty 15

## 2023-07-30 MED ORDER — DIPHENHYDRAMINE HCL 12.5 MG/5ML PO ELIX: 12.5000 mg | ORAL_SOLUTION | Freq: Four times a day (QID) | ORAL | Status: DC | PRN

## 2023-07-30 MED ORDER — DEXMEDETOMIDINE HCL IN NACL 80 MCG/20ML IV SOLN
INTRAVENOUS | Status: DC | PRN
  Administered 2023-07-30: 8 ug via INTRAVENOUS

## 2023-07-30 MED ORDER — ONDANSETRON HCL 4 MG/2ML IJ SOLN: 4.0000 mg | Freq: Once | INTRAMUSCULAR | Status: DC | PRN

## 2023-07-30 MED ORDER — ROCURONIUM BROMIDE 10 MG/ML (PF) SYRINGE
PREFILLED_SYRINGE | INTRAVENOUS | Status: DC | PRN
  Administered 2023-07-30: 50 mg via INTRAVENOUS
  Administered 2023-07-30: 30 mg via INTRAVENOUS

## 2023-07-30 MED ORDER — TRAMADOL HCL 50 MG PO TABS
50.0000 mg | ORAL_TABLET | Freq: Four times a day (QID) | ORAL | Status: DC | PRN
  Administered 2023-07-30 – 2023-08-01 (×5): 50 mg via ORAL
  Filled 2023-07-30 (×5): qty 1

## 2023-07-30 MED ORDER — OXYCODONE HCL 5 MG PO TABS
5.0000 mg | ORAL_TABLET | ORAL | Status: DC | PRN
  Administered 2023-07-31: 5 mg via ORAL
  Filled 2023-07-30: qty 1

## 2023-07-30 MED ORDER — OXYCODONE HCL 5 MG PO TABS: 5.0000 mg | ORAL_TABLET | Freq: Once | ORAL | Status: DC | PRN

## 2023-07-30 MED ORDER — PANTOPRAZOLE SODIUM 40 MG PO TBEC
40.0000 mg | DELAYED_RELEASE_TABLET | Freq: Every day | ORAL | Status: DC
  Administered 2023-07-31 – 2023-08-01 (×2): 40 mg via ORAL
  Filled 2023-07-30 (×2): qty 1

## 2023-07-30 MED ORDER — DEXAMETHASONE SODIUM PHOSPHATE 10 MG/ML IJ SOLN
INTRAMUSCULAR | Status: AC
  Filled 2023-07-30: qty 1

## 2023-07-30 MED ORDER — EPHEDRINE 5 MG/ML INJ
INTRAVENOUS | Status: AC
  Filled 2023-07-30: qty 5

## 2023-07-30 MED ORDER — FENTANYL CITRATE (PF) 250 MCG/5ML IJ SOLN
INTRAMUSCULAR | Status: AC
  Filled 2023-07-30: qty 5

## 2023-07-30 MED ORDER — DEXAMETHASONE SODIUM PHOSPHATE 10 MG/ML IJ SOLN
INTRAMUSCULAR | Status: DC | PRN
  Administered 2023-07-30: 10 mg via INTRAVENOUS

## 2023-07-30 MED ORDER — SODIUM CHLORIDE 0.9 % IR SOLN
Status: DC | PRN
  Administered 2023-07-30: 1000 mL

## 2023-07-30 MED ORDER — HYDROMORPHONE HCL 1 MG/ML IJ SOLN
INTRAMUSCULAR | Status: AC
  Filled 2023-07-30: qty 1

## 2023-07-30 MED ORDER — SUGAMMADEX SODIUM 200 MG/2ML IV SOLN
INTRAVENOUS | Status: DC | PRN
  Administered 2023-07-30: 200 mg via INTRAVENOUS

## 2023-07-30 MED ORDER — BUPIVACAINE LIPOSOME 1.3 % IJ SUSP
INTRAMUSCULAR | Status: DC | PRN
  Administered 2023-07-30: 20 mL

## 2023-07-30 MED ORDER — ONDANSETRON HCL 4 MG/2ML IJ SOLN
INTRAMUSCULAR | Status: AC
  Filled 2023-07-30: qty 2

## 2023-07-30 MED ORDER — MIDAZOLAM HCL 2 MG/2ML IJ SOLN
INTRAMUSCULAR | Status: AC
  Filled 2023-07-30: qty 2

## 2023-07-30 MED ORDER — LIDOCAINE HCL 1 % IJ SOLN
INTRAMUSCULAR | Status: DC | PRN
  Administered 2023-07-30: 6 mL

## 2023-07-30 MED ORDER — MIDAZOLAM HCL 2 MG/2ML IJ SOLN
INTRAMUSCULAR | Status: DC | PRN
  Administered 2023-07-30: 2 mg via INTRAVENOUS

## 2023-07-30 MED ORDER — LIDOCAINE 2% (20 MG/ML) 5 ML SYRINGE
INTRAMUSCULAR | Status: AC
  Filled 2023-07-30: qty 5

## 2023-07-30 MED ORDER — HYDROMORPHONE HCL 1 MG/ML IJ SOLN
INTRAMUSCULAR | Status: DC | PRN
  Administered 2023-07-30: .5 mg via INTRAVENOUS

## 2023-07-30 MED ORDER — ACETAMINOPHEN 500 MG PO TABS
1000.0000 mg | ORAL_TABLET | Freq: Four times a day (QID) | ORAL | Status: DC
  Administered 2023-07-30 – 2023-08-01 (×6): 1000 mg via ORAL
  Filled 2023-07-30 (×6): qty 2

## 2023-07-30 MED ORDER — MORPHINE SULFATE (PF) 2 MG/ML IV SOLN: 1.0000 mg | INTRAVENOUS | Status: DC | PRN

## 2023-07-30 MED ORDER — LIDOCAINE HCL 1 % IJ SOLN
INTRAMUSCULAR | Status: AC
Start: 2023-07-30 — End: ?
  Filled 2023-07-30: qty 20

## 2023-07-30 MED ORDER — PROPOFOL 10 MG/ML IV BOLUS
INTRAVENOUS | Status: DC | PRN
  Administered 2023-07-30: 200 mg via INTRAVENOUS

## 2023-07-30 MED ORDER — ONDANSETRON HCL 4 MG/2ML IJ SOLN
4.0000 mg | Freq: Four times a day (QID) | INTRAMUSCULAR | Status: DC | PRN
Start: 2023-07-30 — End: 2023-08-01
  Administered 2023-07-30: 4 mg via INTRAVENOUS
  Filled 2023-07-30: qty 2

## 2023-07-30 MED ORDER — ROCURONIUM BROMIDE 10 MG/ML (PF) SYRINGE
PREFILLED_SYRINGE | INTRAVENOUS | Status: AC
  Filled 2023-07-30: qty 10

## 2023-07-30 MED ORDER — VASOPRESSIN 20 UNIT/ML IV SOLN
INTRAVENOUS | Status: AC
  Filled 2023-07-30: qty 1

## 2023-07-30 MED ORDER — ALBUTEROL SULFATE (2.5 MG/3ML) 0.083% IN NEBU: 3.0000 mL | INHALATION_SOLUTION | Freq: Four times a day (QID) | RESPIRATORY_TRACT | Status: DC | PRN

## 2023-07-30 MED ORDER — CEFAZOLIN SODIUM-DEXTROSE 2-4 GM/100ML-% IV SOLN
2.0000 g | INTRAVENOUS | Status: AC
  Administered 2023-07-30: 2 g via INTRAVENOUS
  Filled 2023-07-30: qty 100

## 2023-07-30 MED ORDER — GLYCOPYRROLATE 0.2 MG/ML IJ SOLN
INTRAMUSCULAR | Status: DC | PRN
  Administered 2023-07-30: .2 mg via INTRAVENOUS

## 2023-07-30 MED ORDER — ONDANSETRON 4 MG PO TBDP: 4.0000 mg | ORAL_TABLET | Freq: Four times a day (QID) | ORAL | Status: DC | PRN

## 2023-07-30 MED ORDER — OXYCODONE HCL 5 MG/5ML PO SOLN: 5.0000 mg | Freq: Once | ORAL | Status: DC | PRN

## 2023-07-30 MED ORDER — GABAPENTIN 100 MG PO CAPS: 100.0000 mg | ORAL_CAPSULE | Freq: Every evening | ORAL | Status: DC | PRN

## 2023-07-30 MED ORDER — MELATONIN 3 MG PO TABS: 3.0000 mg | ORAL_TABLET | Freq: Every evening | ORAL | Status: DC | PRN

## 2023-07-30 MED ORDER — DIPHENHYDRAMINE HCL 50 MG/ML IJ SOLN: 12.5000 mg | Freq: Four times a day (QID) | INTRAMUSCULAR | Status: DC | PRN

## 2023-07-30 MED ORDER — BUPIVACAINE-EPINEPHRINE (PF) 0.25% -1:200000 IJ SOLN
INTRAMUSCULAR | Status: AC
Start: 2023-07-30 — End: ?
  Filled 2023-07-30: qty 30

## 2023-07-30 MED ORDER — EPHEDRINE SULFATE-NACL 50-0.9 MG/10ML-% IV SOSY
PREFILLED_SYRINGE | INTRAVENOUS | Status: DC | PRN
  Administered 2023-07-30: 5 mg via INTRAVENOUS
  Administered 2023-07-30 (×2): 10 mg via INTRAVENOUS

## 2023-07-30 MED ORDER — HYDROMORPHONE HCL 1 MG/ML IJ SOLN
INTRAMUSCULAR | Status: AC
  Filled 2023-07-30: qty 0.5

## 2023-07-30 SURGICAL SUPPLY — 87 items
ADH SKN CLS APL DERMABOND .7 (GAUZE/BANDAGES/DRESSINGS) ×1
APL LAPSCP 35 DL APL RGD (MISCELLANEOUS)
APL PRP STRL LF DISP 70% ISPRP (MISCELLANEOUS) ×1
APPLICATOR VISTASEAL 35 (MISCELLANEOUS) IMPLANT
APPLIER CLIP 5 13 M/L LIGAMAX5 (MISCELLANEOUS) ×1
APR CLP MED LRG 5 ANG JAW (MISCELLANEOUS) ×1
BAG COUNTER SPONGE SURGICOUNT (BAG) ×1 IMPLANT
BAG SPNG CNTER NS LX DISP (BAG) ×1
BLADE CLIPPER SURG (BLADE) IMPLANT
BLADE SURG 10 STRL SS (BLADE) ×1 IMPLANT
CANISTER SUCT 3000ML PPV (MISCELLANEOUS) ×1 IMPLANT
CHLORAPREP W/TINT 26 (MISCELLANEOUS) ×1 IMPLANT
CLIP APPLIE 5 13 M/L LIGAMAX5 (MISCELLANEOUS) IMPLANT
COVER SURGICAL LIGHT HANDLE (MISCELLANEOUS) ×1 IMPLANT
DERMABOND ADVANCED .7 DNX12 (GAUZE/BANDAGES/DRESSINGS) IMPLANT
DEVICE TROCAR PUNCTURE CLOSURE (ENDOMECHANICALS) IMPLANT
DRAIN CHANNEL 19F RND (DRAIN) IMPLANT
DRAIN PENROSE 0.5X18 (DRAIN) IMPLANT
DRAPE WARM FLUID 44X44 (DRAPES) ×1 IMPLANT
DRSG COVADERM 4X10 (GAUZE/BANDAGES/DRESSINGS) IMPLANT
DRSG COVADERM 4X14 (GAUZE/BANDAGES/DRESSINGS) IMPLANT
ELECT BLADE 6.5 EXT (BLADE) IMPLANT
ELECT REM PT RETURN 9FT ADLT (ELECTROSURGICAL) ×1
ELECTRODE REM PT RTRN 9FT ADLT (ELECTROSURGICAL) ×1 IMPLANT
EVACUATOR SILICONE 100CC (DRAIN) IMPLANT
GAUZE SPONGE 4X4 12PLY STRL (GAUZE/BANDAGES/DRESSINGS) IMPLANT
GLOVE BIO SURGEON STRL SZ 6 (GLOVE) ×1 IMPLANT
GLOVE INDICATOR 6.5 STRL GRN (GLOVE) ×1 IMPLANT
GOWN STRL REUS W/ TWL LRG LVL3 (GOWN DISPOSABLE) ×3 IMPLANT
GOWN STRL REUS W/ TWL XL LVL3 (GOWN DISPOSABLE) ×1 IMPLANT
GOWN STRL REUS W/TWL LRG LVL3 (GOWN DISPOSABLE) ×3
GOWN STRL REUS W/TWL XL LVL3 (GOWN DISPOSABLE) ×1
HEMOSTAT SURGICEL 2X14 (HEMOSTASIS) IMPLANT
IRRIG SUCT STRYKERFLOW 2 WTIP (MISCELLANEOUS)
IRRIGATION SUCT STRKRFLW 2 WTP (MISCELLANEOUS) IMPLANT
J-TUBE MIC 16FX51 UNV ENFIT (TUBING) IMPLANT
KIT BASIN OR (CUSTOM PROCEDURE TRAY) ×1 IMPLANT
KIT TURNOVER KIT B (KITS) ×1 IMPLANT
L-HOOK LAP DISP 36CM (ELECTROSURGICAL) ×1
LHOOK LAP DISP 36CM (ELECTROSURGICAL) ×1 IMPLANT
LOOP VASCLR MAXI BLUE 18IN ST (MISCELLANEOUS) IMPLANT
NS IRRIG 1000ML POUR BTL (IV SOLUTION) ×2 IMPLANT
PAD ARMBOARD 7.5X6 YLW CONV (MISCELLANEOUS) ×2 IMPLANT
PENCIL SMOKE EVACUATOR (MISCELLANEOUS) ×1 IMPLANT
RELOAD PROXIMATE 75MM BLUE (ENDOMECHANICALS)
RELOAD STAPLE 60 2.6 WHT THN (STAPLE) IMPLANT
RELOAD STAPLE 60 3.6 BLU REG (STAPLE) IMPLANT
RELOAD STAPLE 75 3.8 BLU REG (ENDOMECHANICALS) IMPLANT
SHEARS 1100 HARMONIC 36 (ELECTROSURGICAL) ×1 IMPLANT
SHEARS FOC LG CVD HARMONIC 17C (MISCELLANEOUS) IMPLANT
SLEEVE Z-THREAD 5X100MM (TROCAR) ×2 IMPLANT
STAPLE ECHEON FLEX 60 POW ENDO (STAPLE) IMPLANT
STAPLER PROXIMATE 75MM BLUE (STAPLE) IMPLANT
STAPLER RELOAD BLUE 60MM (STAPLE) ×3
STAPLER RELOAD WHITE 60MM (STAPLE)
STAPLER VISISTAT 35W (STAPLE) IMPLANT
SUT ETHILON 2 0 FS 18 (SUTURE) IMPLANT
SUT MNCRL AB 4-0 PS2 18 (SUTURE) ×1 IMPLANT
SUT PDS AB 1 TP1 96 (SUTURE) IMPLANT
SUT PDS AB 3-0 SH 27 (SUTURE) ×2 IMPLANT
SUT PDS II 0 TP-1 LOOPED 60 (SUTURE) IMPLANT
SUT SILK 2 0 SH CR/8 (SUTURE) ×1 IMPLANT
SUT SILK 2 0 TIES 10X30 (SUTURE) ×1 IMPLANT
SUT SILK 3 0 SH CR/8 (SUTURE) ×1 IMPLANT
SUT SILK 3 0 TIES 10X30 (SUTURE) ×1 IMPLANT
SUT VICRYL 0 UR6 27IN ABS (SUTURE) IMPLANT
SYS BAG RETRIEVAL 10MM (BASKET) ×1
SYS LAPSCP GELPORT 120MM (MISCELLANEOUS)
SYSTEM BAG RETRIEVAL 10MM (BASKET) ×1 IMPLANT
SYSTEM LAPSCP GELPORT 120MM (MISCELLANEOUS) IMPLANT
TIP INNERVISION DETACH 40FR (MISCELLANEOUS) IMPLANT
TIP INNERVISION DETACH 50FR (MISCELLANEOUS) IMPLANT
TIP INNERVISION DETACH 56FR (MISCELLANEOUS) IMPLANT
TOWEL GREEN STERILE (TOWEL DISPOSABLE) ×1 IMPLANT
TOWEL GREEN STERILE FF (TOWEL DISPOSABLE) ×1 IMPLANT
TRAY FOLEY MTR SLVR 14FR STAT (SET/KITS/TRAYS/PACK) ×1 IMPLANT
TRAY LAPAROSCOPIC MC (CUSTOM PROCEDURE TRAY) ×1 IMPLANT
TROCAR BALLN 12MMX100 BLUNT (TROCAR) IMPLANT
TROCAR Z THREAD OPTICAL 12X100 (TROCAR) IMPLANT
TROCAR Z-THREAD OPTICAL 5X100M (TROCAR) ×1 IMPLANT
TUBE CONNECTING 12X1/4 (SUCTIONS) IMPLANT
TUBE JEJUNAL 16FR ENFIT (TUBING)
TUBING EVAC SMOKE HEATED PNEUM (TUBING) ×1 IMPLANT
VASCULAR TIE MAXI BLUE 18IN ST (MISCELLANEOUS)
WARMER LAPAROSCOPE (MISCELLANEOUS) ×1 IMPLANT
WATER STERILE IRR 1000ML POUR (IV SOLUTION) ×1 IMPLANT
YANKAUER SUCT BULB TIP NO VENT (SUCTIONS) IMPLANT

## 2023-07-30 NOTE — Transfer of Care (Signed)
Immediate Anesthesia Transfer of Care Note  Patient: Tyler Obrien  Procedure(s) Performed: LAPAROSCOPIC PARTIAL GASTRECTOMY  Patient Location: PACU  Anesthesia Type:General  Level of Consciousness: awake, alert , and oriented  Airway & Oxygen Therapy: Patient Spontanous Breathing  Post-op Assessment: Report given to RN and Post -op Vital signs reviewed and stable  Post vital signs: Reviewed and stable  Last Vitals:  Vitals Value Taken Time  BP 147/87 07/30/23 1619  Temp 36.4 C 07/30/23 1619  Pulse 95 07/30/23 1629  Resp 18 07/30/23 1629  SpO2 93 % 07/30/23 1629  Vitals shown include unfiled device data.  Last Pain:  Vitals:   07/30/23 1142  TempSrc:   PainSc: 0-No pain         Complications: No notable events documented.

## 2023-07-30 NOTE — Anesthesia Preprocedure Evaluation (Signed)
Anesthesia Evaluation  Patient identified by MRN, date of birth, ID band Patient awake    Reviewed: Allergy & Precautions, H&P , NPO status , Patient's Chart, lab work & pertinent test results  Airway Mallampati: II  TM Distance: >3 FB Neck ROM: Full    Dental no notable dental hx.    Pulmonary neg pulmonary ROS   Pulmonary exam normal breath sounds clear to auscultation       Cardiovascular Normal cardiovascular exam Rhythm:Regular Rate:Normal     Neuro/Psych CVA  negative psych ROS   GI/Hepatic Neg liver ROS, PUD,GERD  ,,GIST tumor   Endo/Other  negative endocrine ROS    Renal/GU negative Renal ROS  negative genitourinary   Musculoskeletal negative musculoskeletal ROS (+)    Abdominal   Peds negative pediatric ROS (+)  Hematology negative hematology ROS (+)   Anesthesia Other Findings   Reproductive/Obstetrics negative OB ROS                             Anesthesia Physical Anesthesia Plan  ASA: 3  Anesthesia Plan: General   Post-op Pain Management: Tylenol PO (pre-op)*   Induction: Intravenous  PONV Risk Score and Plan: 2 and Ondansetron, Dexamethasone and Treatment may vary due to age or medical condition  Airway Management Planned: Oral ETT  Additional Equipment:   Intra-op Plan:   Post-operative Plan: Extubation in OR  Informed Consent: I have reviewed the patients History and Physical, chart, labs and discussed the procedure including the risks, benefits and alternatives for the proposed anesthesia with the patient or authorized representative who has indicated his/her understanding and acceptance.     Dental advisory given  Plan Discussed with: CRNA and Surgeon  Anesthesia Plan Comments:        Anesthesia Quick Evaluation

## 2023-07-30 NOTE — Interval H&P Note (Signed)
History and Physical Interval Note:  07/30/2023 1:35 PM  [name]  has presented today for surgery, with the diagnosis of GIST STOMACH.  The various methods of treatment have been discussed with the patient and family. After consideration of risks, benefits and other options for treatment, the patient has consented to  Procedure(s) with comments: LAPAROSCOPIC PARTIAL GASTRECTOMY (N/A) - HARMONIC SCALPEL ECHELON STAPLER as a surgical intervention.  The patient's history has been reviewed, patient examined, no change in status, stable for surgery.  I have reviewed the patient's chart and labs.  Questions were answered to the patient's satisfaction.     Tyler Obrien

## 2023-07-30 NOTE — Op Note (Signed)
PRE-OPERATIVE DIAGNOSIS: GIST body of stomach  POST-OPERATIVE DIAGNOSIS:  Same  PROCEDURE:  Procedure(s): Laparoscopic partial gastrectomy  SURGEON:  Surgeon(s): Almond Lint, MD  ASSISTANT:   Jeronimo Greaves, RNFA  ANESTHESIA:   local and general  DRAINS: none   LOCAL MEDICATIONS USED:  BUPIVICAINE  and LIDOCAINE   SPECIMEN:  Source of Specimen:  gastric mass, lesser curve  DISPOSITION OF SPECIMEN:  PATHOLOGY  COUNTS:  YES  PLAN OF CARE: Admit to inpatient   PATIENT DISPOSITION:  PACU - hemodynamically stable.   FINDINGS:  Rubbery mass in proximal stomach approximately 3.5 cm.  PROCEDURE:  Pt was identified in the holding area and taken to the operating room and placed supine on the operating room table.  General anesthesia was induced.  The abdomen was prepped and draped in sterile fashion.  Timeout was performed according to the surgical safety checklist.  When all was correct, we continued.    The patient was placed in reverse Trendelenberg position and rotated to the left.  A 5 mm Optiview trocar was placed at the left costal margin under direct visualization.  Two 5 mm trocars were placed in the RUQ as well, and one in the left mid abdomen.  A 5 mm incision was placed in the subxiphoid position and the Nathanson retractor was used to hold the left lateral segment up.   The tissue along the lesser curve in the gastrohepatic ligament was taken down with the harmonic scalpel.  The stomach was rotated toward the left and the mass was seen. The RUQ port was upsized to a 12 mm port.    The mass was able to be rotated laterally to elevate the mass.Three fires of the blue load of the Echelon stapler were used to divide the wall of the stomach with the mass from the stomach.  There was no bleeding at the staple line.  The mass was retrieved with an endocatch bag via the 12 mm port.  The skin and fascia had to be opened more due to the size of the mass. The specimen was examined to  make sure the gross margin was negative.    The left upper quadrant was reexamined for hemostasis.  There was no evidence of bleeding.  The liver had a tiny spot of bleeding along the falciform from the nathanson and this was coagulated successfully.  A 4 quadrant inspection was performed and was negative for signs of succus or blood.  The 12 mm port was closed with four interrupted 0-0 vicryls.  There was no air leakage from the port.  The remaining ports were removed and pneumoperitoneum was allowed to evacuate.  The skin of all the incisions was closed with 4-0 Monocryl in subcuticular fashion.  The wounds were cleaned, dried, and dressed with dermabond. Needle, sponge, and instrument counts were correct.  The patient was awakened from anesthesia and taken to the PACU in stable condition.

## 2023-07-30 NOTE — Anesthesia Procedure Notes (Signed)
Procedure Name: Intubation Date/Time: 07/30/2023 1:58 PM  Performed by: Alwyn Ren, CRNAPre-anesthesia Checklist: Patient identified, Emergency Drugs available, Suction available and Patient being monitored Patient Re-evaluated:Patient Re-evaluated prior to induction Oxygen Delivery Method: Circle system utilized Preoxygenation: Pre-oxygenation with 100% oxygen Induction Type: IV induction Ventilation: Mask ventilation without difficulty Laryngoscope Size: Mac and 4 Grade View: Grade I Tube type: Oral Tube size: 7.0 mm Number of attempts: 3 Airway Equipment and Method: Stylet and Oral airway Placement Confirmation: ETT inserted through vocal cords under direct vision, positive ETCO2 and breath sounds checked- equal and bilateral Secured at: 24 cm Tube secured with: Tape Dental Injury: Teeth and Oropharynx as per pre-operative assessment  Comments: Intubated x 1 with Hyacinth Meeker 2-unable to fully advance tube easily and tongue unable to be positioned as normal (pushed back). Tube removed and attempted with Hyacinth Meeker 3, secretions present. Dr. Okey Dupre atttempt x 1 with MAC 4-tube twisted and placed and tongue checked for normal position. Taped at 24 cm at lip.

## 2023-07-30 NOTE — Plan of Care (Signed)

## 2023-07-30 NOTE — H&P (Signed)
REFERRING PHYSICIAN: Mansouraty, Netty Starring.* PROVIDER: Matthias Hughs, MD MRN: [id number] DOB: [birth year] DATE OF ENCOUNTER: 07/11/2023 Subjective   Chief Complaint: GIST  History of Present Illness: Tyler Obrien is a 62 y.o. 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 He found out about this and January 2024 when he was undergoing workup for kidney stones. He has a long history of kidney stones and required treatment for this. He was referred for endoscopy and endoscopic ultrasound. His EGD was essentially normal. However on endoscopic ultrasound he had a 3.2 x 2.5 mm mass in the lesser curve around 10 cm distal to the GE junction. FNA was performed and this was positive for spindle cell proliferation. At least on cytology it appeared to be low-grade without any significant mitotic activity or necrosis.  The patient has had a little bit of bloating, but has not had any upper abdominal pain or GI bleeding. Has not been any nausea or vomiting. He has not had any significant heartburn.  Of note, he has had family cancer history with a mother having endometrial cancer and a maternal grandfather having stomach cancer. On his father side there is a history of lung cancer.  Also, patient is status post CVA in 2011. He has no residual defect and has been on baby aspirin since then. Workup was negative for any known cause of the CVA.  CT renal protocol 10/14/22 IMPRESSION: 1. Two stones in the distal third of the left ureter with moderate proximal left hydroureteronephrosis indicating obstruction, as above. 2. Multiple additional nonobstructive calculi in the collecting systems of both kidneys. 3. Subtle exophytic lesion associated with the lesser curvature in the antral pre-pyloric region of the stomach concerning for neoplasm such as GI stromal tumor (GIST). Outpatient  referral to GI for further evaluation and probable endoscopy and tissue sampling is strongly recommended in the near future to exclude underlying malignancy. 4. Colonic diverticulosis without evidence of acute diverticulitis at this time. 5. Aortic atherosclerosis.   CT abd pel with contrast 04/08/23 IMPRESSION: 1. 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 Further evaluation with endoscopy/tissue sampling is again recommended if not already performed. 2. No acute intra-abdominal pathology identified. 3. Multiple other nonacute observations, as described above.  CT chest 06/11/23 IMPRESSION: 1. No evidence of metastatic disease within the chest. 2. Stable exophytic lesion projecting from the lesser curvature of the stomach, consistent with recently diagnosed GI stromal tumor.  EGD/EUS 05/29/23 mansouraty Impression:  EGD Impression: - No gross lesions in the entire esophagus. Z- line irregular, 40 cm from the incisors. - 1 cm hiatal hernia. - Erythematous mucosa in the gastric body and antrum. No other gross lesions in the entire stomach. Biopsied. - No gross lesions in the duodenal bulb, in the first portion of the duodenum and in the second portion of the duodenum. - Normal major papilla.  Outpatient EUS Impression: - An intramural ( subepithelial) lesion was found in the lesser curve of the stomach. The lesion appeared to originate from within the serosa ( Layer 5) . Cytology results are pending. However, the endosonographic appearance is suspicious for possible stromal cell ( smooth muscle) lesion. Fine needle biopsy performed. - Endosonographic images of the rest of the stomach were unremarkable. - No malignant- appearing lymph nodes were visualized in the paracardial region (  level 16) , left gastric region ( level 17) , gastrohepatic ligament ( level 18) , celiac region ( level 20)  and perigastric region.  Cytology 05/29/23 FINAL MICROSCOPIC DIAGNOSIS:  - Spindle cell proliferation  DIAGNOSTIC COMMENTS:  Immunohistochemical stain show that the spindle cells are positive for  CD117 and CD34 while they are negative for SMA. This immunoprofile is  consistent with a gastrointestinal stromal tumor (GIST). The lesion  appears low-grade without any significant mitotic activity or necrosis  but optimal grading is deferred to the final resection specimen due to  very scant amount of lesion present in the cytology specimen.  .   Review of Systems: A complete review of systems was obtained from the patient. I have reviewed this information and discussed as appropriate with the patient. See HPI as well for other ROS.  Review of Systems  HENT: Positive for congestion.  Respiratory: Positive for shortness of breath.  Cardiovascular: Positive for palpitations.  Gastrointestinal: Positive for abdominal pain, constipation and nausea.  Musculoskeletal: Positive for neck pain.  Skin: Positive for itching and rash.  Neurological: Positive for headaches.  Psychiatric/Behavioral: Positive for memory loss.    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 Ebbie Ridge  UPPER ENDOSCOPIC ULTRASOUND (EUS) RADIAL ESOPHAGOGASTRODUODENOSCOPY (EGD) BIOPSY FINE NEEDLE ASPIRATION (FNA) LINEAR 05/29/2023  Dr. Meridee Score    Allergies  Allergen Reactions  Nsaids (Non-Steroidal Anti-Inflammatory Drug) Other (See Comments)  BLEEDING/ULCER  Lidocaine-Menthol Hives,  Itching and Rash  Prednisone Palpitations   Current Outpatient Medications on File Prior to Visit  Medication Sig Dispense Refill  cetirizine (ZYRTEC) 10 MG tablet Take 10 mg by mouth once daily  FLONASE ALLERGY RELIEF 50 mcg/actuation nasal spray  aspirin 81 MG EC tablet Take 81 mg by mouth once daily  omeprazole (PRILOSEC) 20 MG DR capsule Take by mouth   No current facility-administered medications on file prior to visit.   Family History  Problem Relation Age of Onset  Endometrial cancer (Uterus cancer) Mother  Hyperlipidemia (Elevated cholesterol) Father  High blood pressure (Hypertension) Father  Lung cancer Father  Stomach cancer Maternal Grandfather    Social History   Tobacco Use  Smoking Status Never  Smokeless Tobacco Former    Social History   Socioeconomic History  Marital status: Married  Tobacco Use  Smoking status: Never  Smokeless tobacco: Former  Substance and Sexual Activity  Alcohol use: Yes  Alcohol/week: 7.0 standard drinks of alcohol  Types: 7 Standard drinks or equivalent per week  Drug use: Never   Social Determinants of Health   Financial Resource Strain: Low Risk (01/20/2023)  Received from [facility]  Overall Financial Resource Strain (CARDIA)  Difficulty of Paying Living Expenses: Not hard at all  Food Insecurity: No Food Insecurity (04/04/2023)  Received from [facility]  Hunger Vital Sign  Worried About Running Out of Food in the Last Year: Never true  Ran Out of Food in the Last Year: Never true  Transportation Needs: No Transportation Needs (01/20/2023)  Received from [facility] - Transportation  Lack of Transportation (Medical): No  Lack of Transportation (Non-Medical): No  Physical Activity: Insufficiently Active (01/20/2023)  Received from [facility]  Exercise Vital Sign  Days of Exercise per Week: 1 day  Minutes of Exercise per Session: 20 min  Stress: Stress Concern Present (01/20/2023)  Received from [facility] - Occupational Stress Questionnaire  Feeling of Stress : To some extent  Social Connections: Socially Integrated (01/20/2023)  Received from [facility]  Social Connection and Isolation Panel [NHANES]  Frequency of Communication with Friends and Family: More than three times a week  Frequency of Social Gatherings with Friends and Family: Twice a week  Attends Religious Services: More than 4 times per year  Active Member of [employer] or Organizations: Yes  Attends Engineer, structural: More than 4 times per year  Marital Status: Married   Objective:   Vitals:  07/11/23 0909  BP: (!) 144/90  Pulse: 94  Temp: 36.6 C (97.9 F)  SpO2: 96%  Weight: 89 kg (196 lb 3.2 oz)  Height: 175.3 cm (5\' 9" )  PainSc: 0-No pain   Body mass index is 28.97 kg/m.  Head: Normocephalic and atraumatic.  Mouth/Throat: Oropharynx is clear and moist. No oropharyngeal exudate.  Eyes: Conjunctivae are normal. Pupils are equal, round, and reactive to light. No scleral icterus.  Neck: Normal range of motion. Neck supple. No tracheal deviation present. No thyromegaly present.  Cardiovascular: Normal rate, regular rhythm, normal heart sounds and intact distal pulses. Exam reveals no gallop and no friction rub.  No murmur heard. Respiratory: Effort normal and breath sounds normal. No respiratory distress. No wheezes, rales or rhonchi. No chest wall tenderness.  GI: Soft. Bowel sounds are normal. Abdomen is soft, non tender, non distended. No masses or hepatosplenomegaly is present. There is no rebound and no guarding.  Musculoskeletal: . Extremities are non tender and without deformity.  Lymphadenopathy: No cervical or axillary adenopathy.  Neurological: Alert and oriented to person, place, and time. Coordination normal.  Skin: Skin is warm and dry. No rash noted. No diaphoresis. No erythema. No pallor.  Psychiatric: Normal mood and affect.Behavior is normal.  Judgment and thought content normal.   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 This is amenable to laparoscopic resection. It sounds as though it is relatively exophytic. I discussed laparoscopic partial gastrectomy with the patient and his spouse. I reviewed timing of surgery. I discussed that usually takes around an hour and 1/2 to 2 hours. I advised that I would keep him in the hospital at least overnight and that some patients needed a second night if they were having issues with oral intake.  I discussed risks of surgery including bleeding, infection, damage to adjacent structures, possible heart or lung complications, possible blood clot, possible change in dietary habits. I reviewed that I recommended no strenuous activity or heavy lifting for at least 2 weeks. I also advised that I would stay on a soft diet for around 2 weeks. I discussed that sometimes these are classified as malignancy, but that with the size and appearance in most cases did not require additional treatment beyond surgery. I did review that we may need to  do surveillance imaging depending on the final pathology.  He is offered genetics referral given the fact that his mother had endometrial cancer and maternal grandfather had stomach cancer. We will address this later in recovery.  The patient wishes to proceed.

## 2023-07-30 NOTE — Discharge Instructions (Addendum)
[facility] ______[facility] LAPAROSCOPIC SURGERY: POST OP INSTRUCTIONS Always review your discharge instruction sheet given to you by the facility where your surgery was performed. IF YOU HAVE DISABILITY OR FAMILY LEAVE FORMS, YOU MUST BRING THEM TO THE OFFICE FOR PROCESSING.   DO NOT GIVE THEM TO YOUR DOCTOR.  A prescription for pain medication may be given to you upon discharge.  Take your pain medication as prescribed, if needed.  If narcotic pain medicine is not needed, then you may take acetaminophen (Tylenol) or ibuprofen (Advil) as needed. Take your usually prescribed medications unless otherwise directed. If you need a refill on your pain medication, please contact your pharmacy.  They will contact our office to request authorization. Prescriptions will not be filled after 5pm or on week-ends. You should follow a light diet the first few days after arrival home, such as soup and crackers, etc.  Be sure to include lots of fluids daily. Most patients will experience some swelling and bruising in the area of the incisions.  Ice packs will help.  Swelling and bruising can take several days to resolve.  It is common to experience some constipation if taking pain medication after surgery.  Increasing fluid intake and taking a stool softener (such as Colace) will usually help or prevent this problem from occurring.  A mild laxative (Milk of Magnesia or Miralax) should be taken according to package instructions if there are no bowel movements after 48 hours. Unless discharge instructions indicate otherwise, you may remove your bandages 24-48 hours after surgery, and you may shower at that time.  You may have steri-strips (small skin tapes) in place directly over the incision.  These strips should be left on the skin for 7-10 days.  If your surgeon used skin glue on the incision, you may shower in 24 hours.  The glue will flake off over the next 2-3 weeks.  Any sutures or staples will be  removed at the office during your follow-up visit. ACTIVITIES:  You may resume regular (light) daily activities beginning the next day--such as daily self-care, walking, climbing stairs--gradually increasing activities as tolerated.  You may have sexual intercourse when it is comfortable.  Refrain from any heavy lifting or straining until approved by your doctor. (2-4 weeks) You may drive when you are no longer taking prescription pain medication, you can comfortably wear a seatbelt, and you can safely maneuver your car and apply brakes. RETURN TO WORK:  ___________________to be determined.____________________________ Tyler Obrien should see your doctor in the office for a follow-up appointment approximately 2-3 weeks after your surgery.  Make sure that you call for this appointment within a day or two after you arrive home to insure a convenient appointment time. OTHER INSTRUCTIONS: __________________________________________________________________________________________________________________________ __________________________________________________________________________________________________________________________ WHEN TO CALL YOUR DOCTOR: Fever over 101.0 Inability to urinate Continued bleeding from incision. Increased pain, redness, or drainage from the incision. Increasing abdominal pain  The clinic staff is available to answer your questions during regular business hours.  Please don't hesitate to call and ask to speak to one of the nurses for clinical concerns.  If you have a medical emergency, go to the nearest emergency room or call 911.  A surgeon from [facility] is always on call at the hospital. [address], [address], [place], [place]  [address] ? [address] [address], [place], [place]   [address] [phone] ? [phone] ? FAX [phone] Web site: [email or web address]

## 2023-07-30 NOTE — Anesthesia Postprocedure Evaluation (Signed)
Anesthesia Post Note  Patient: Tyler Obrien  Procedure(s) Performed: LAPAROSCOPIC PARTIAL GASTRECTOMY     Patient location during evaluation: PACU Anesthesia Type: General Level of consciousness: awake and alert Pain management: pain level controlled Vital Signs Assessment: post-procedure vital signs reviewed and stable Respiratory status: spontaneous breathing, nonlabored ventilation, respiratory function stable and patient connected to nasal cannula oxygen Cardiovascular status: blood pressure returned to baseline and stable Postop Assessment: no apparent nausea or vomiting Anesthetic complications: no  No notable events documented.  Last Vitals:  Vitals:   07/30/23 1630 07/30/23 1645  BP: (!) 146/86 (!) 142/91  Pulse: 85 92  Resp: (!) 9 18  Temp:    SpO2: 94% 96%    Last Pain:  Vitals:   07/30/23 1645  TempSrc:   PainSc: 3                  Ahlam Piscitelli S

## 2023-07-31 ENCOUNTER — Encounter (HOSPITAL_COMMUNITY): Payer: Self-pay | Admitting: General Surgery

## 2023-07-31 DIAGNOSIS — C49A2 Gastrointestinal stromal tumor of stomach: Secondary | ICD-10-CM | POA: Diagnosis not present

## 2023-07-31 LAB — BASIC METABOLIC PANEL
Anion gap: 10 (ref 5–15)
BUN: 12 mg/dL (ref 8–23)
CO2: 22 mmol/L (ref 22–32)
Calcium: 9.7 mg/dL (ref 8.9–10.3)
Chloride: 104 mmol/L (ref 98–111)
Creatinine, Ser: 1.26 mg/dL — ABNORMAL HIGH (ref 0.61–1.24)
GFR, Estimated: >60 mL/min (ref >60)
Glucose, Bld: 133 mg/dL — ABNORMAL HIGH (ref 70–99)
Potassium: 4.1 mmol/L (ref 3.5–5.1)
Sodium: 136 mmol/L (ref 135–145)

## 2023-07-31 LAB — CBC
HCT: 48.3 % (ref 39.0–52.0)
Hemoglobin: 16.9 g/dL (ref 13.0–17.0)
MCH: 30.2 pg (ref 26.0–34.0)
MCHC: 35 g/dL (ref 30.0–36.0)
MCV: 86.4 fL (ref 80.0–100.0)
Platelets: 222 10*3/uL (ref 150–400)
RBC: 5.59 MIL/uL (ref 4.22–5.81)
RDW: 13.2 % (ref 11.5–15.5)
WBC: 15.2 10*3/uL — ABNORMAL HIGH (ref 4.0–10.5)
nRBC: 0 % (ref 0.0–0.2)

## 2023-07-31 NOTE — Plan of Care (Signed)

## 2023-07-31 NOTE — Progress Notes (Signed)
   07/31/23 1513  TOC Brief Assessment  Insurance and Status Reviewed  Patient has primary care physician Yes  Home environment has been reviewed yes  Prior level of function: independent  Prior/Current Home Services No current home services  Social Determinants of Health Reivew SDOH reviewed no interventions necessary  Readmission risk has been reviewed Yes  Transition of care needs no transition of care needs at this time

## 2023-07-31 NOTE — Progress Notes (Signed)
1 Day Post-Op   Subjective/Chief Complaint: Belching some.  No n/v.  Tolerated some coffee.  No flatus.  Pain LUQ   Objective: Vital signs in last 24 hours: Temp:  [97.4 F (36.3 C)-97.9 F (36.6 C)] 97.6 F (36.4 C) (10/24 1238) Pulse Rate:  [85-110] 103 (10/24 1238) Resp:  [9-20] 17 (10/24 1238) BP: (133-158)/(81-101) 151/92 (10/24 1238) SpO2:  [90 %-97 %] 95 % (10/24 1238)    Intake/Output from previous day: 10/23 0701 - 10/24 0700 In: 1780.4 [I.V.:1780.4] Out: 15 [Blood:15] Intake/Output this shift: Total I/O In: -  Out: 750 [Urine:750]  General appearance: alert, cooperative, and no distress Resp: breathing comfortably GI: soft, bloated. Tender at RUQ incision.  Extremities: extremities normal, atraumatic, no cyanosis or edema  Lab Results:  Recent Labs    07/31/23 0838  WBC 15.2*  HGB 16.9  HCT 48.3  PLT 222   BMET Recent Labs    07/31/23 0838  NA 136  K 4.1  CL 104  CO2 22  GLUCOSE 133*  BUN 12  CREATININE 1.26*  CALCIUM 9.7   PT/INR No results for input(s): "LABPROT", "INR" in the last 72 hours. ABG No results for input(s): "PHART", "HCO3" in the last 72 hours.  Invalid input(s): "PCO2", "PO2"  Studies/Results: No results found.  Anti-infectives: Anti-infectives (From admission, onward)    Start     Dose/Rate Route Frequency Ordered Stop   07/30/23 2200  ceFAZolin (ANCEF) IVPB 2g/100 mL premix        2 g 200 mL/hr over 30 Minutes Intravenous Every 8 hours 07/30/23 1708 07/30/23 2152   07/30/23 1147  ceFAZolin (ANCEF) IVPB 2g/100 mL premix        2 g 200 mL/hr over 30 Minutes Intravenous On call to O.R. 07/30/23 1119 07/30/23 1400       Assessment/Plan: s/p Procedure(s) with comments: LAPAROSCOPIC PARTIAL GASTRECTOMY (N/A) - HARMONIC SCALPEL ECHELON STAPLER Pt fairly bloated and belching. Concerned that this will become n/v and gastric ileus. Will plan to keep until at least some flatus.  Pain control Incentive spirometry.     LOS: 0 days    Almond Lint 07/31/2023

## 2023-08-01 DIAGNOSIS — C49A2 Gastrointestinal stromal tumor of stomach: Secondary | ICD-10-CM | POA: Diagnosis not present

## 2023-08-01 MED ORDER — METHOCARBAMOL 500 MG PO TABS: 500.0000 mg | ORAL_TABLET | Freq: Four times a day (QID) | ORAL | 1 refills | Status: DC | PRN

## 2023-08-01 MED ORDER — ONDANSETRON 4 MG PO TBDP: 4.0000 mg | ORAL_TABLET | Freq: Four times a day (QID) | ORAL | 0 refills | Status: DC | PRN

## 2023-08-01 MED ORDER — OXYCODONE HCL 5 MG PO TABS: 5.0000 mg | ORAL_TABLET | ORAL | 0 refills | Status: DC | PRN

## 2023-08-01 NOTE — Discharge Summary (Signed)
Physician Discharge Summary  Patient ID: Tyler Obrien MRN: [id number] DOB/AGE: 1961/02/21 62 y.o.  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  On POD 1 he had good pain control overall, but was belching a lot and was quite bloated.  He also hadn't had very much PO intake.  By POD 2, his abdomen was much softer and less bloated.  He had been able to take in more food and had decreased belching.  He was discharged in stable condition.   Consults: None  Significant Diagnostic Studies: labs: POD 1 HCT 48.3. Cr 1.26  Treatments: surgery: see above  Discharge Exam: Blood pressure (!) 153/103, pulse 86, temperature 98.4 F (36.9 C), temperature source Oral, resp. rate 16, height 5\' 9"  (1.753 m), weight 86.2 kg, SpO2 93%. General appearance: alert, cooperative, and no distress Resp: breathing comfortably GI: soft, minimally distended. RUQ incision with some bruising.   Disposition: Discharge disposition: 01-Home or Self Care      Discharge Instructions     Call MD for:  difficulty breathing, headache or visual disturbances   Complete by: As directed    Call MD for:  hives   Complete by: As directed    Call MD for:  persistant nausea and vomiting   Complete by: As directed    Call MD for:  redness, tenderness, or signs of infection (pain, swelling, redness, odor or green/yellow discharge around incision site)   Complete by: As directed    Call MD for:  severe uncontrolled pain   Complete by: As directed    Call MD for:  temperature >100.4   Complete by: As directed    Discharge diet:   Complete by: As directed    Soft diet x 1-2 weeks   Increase activity slowly   Complete by: As directed        Allergies as of 08/01/2023       Reactions   Nsaids Other (See Comments)   BLEEDING/ULCER   Lidocaine-menthol Rash   Only the patches   Prednisone Palpitations        Medication List     TAKE these medications    albuterol 108 (90 Base) MCG/ACT inhaler Commonly known as: VENTOLIN HFA Inhale 2 puffs into the lungs every 6 (six) hours as needed.   aspirin EC 81 MG tablet Take 81 mg by mouth daily.   azelastine 0.1 % nasal spray Commonly known as: ASTELIN Place 2 sprays into both nostrils 2 (two) times daily as needed for allergies or rhinitis.   b complex vitamins tablet Take 1 tablet by mouth daily.   cetirizine 10 MG tablet Commonly known as: ZYRTEC Take 10 mg by mouth daily as needed for allergies.   chlorpheniramine-HYDROcodone 10-8 MG/5ML Commonly known as: TUSSIONEX Take 5 mLs by mouth every 12 (twelve) hours as needed for cough.   chlorpheniramine-HYDROcodone 10-8 MG/5ML Commonly known as: TUSSIONEX 5 ml po q 12 hours prn cough   Collagen Hydrolysate Powd Take 1 Scoop by mouth daily at 6 (six) AM. Collagen powder   COQ-10 PO Take 1 capsule by mouth daily.   diphenhydrAMINE 25 mg capsule Commonly known as: BENADRYL Take 25-50 mg by mouth at bedtime as needed for sleep or allergies.   fluticasone 50 MCG/ACT nasal spray Commonly known  as: FLONASE Place 2 sprays into both nostrils daily. What changed:  when to take this reasons to take this   gabapentin 100 MG capsule Commonly known as: NEURONTIN Take 100 mg by mouth at bedtime as needed (pain).   guaiFENesin 600 MG 12 hr tablet Commonly known as: MUCINEX Take 600 mg by mouth 2 (two) times daily as needed for cough or to loosen phlegm.   guaiFENesin-dextromethorphan 100-10 MG/5ML syrup Commonly known as: ROBITUSSIN DM Take 5 mLs by mouth every 4 (four) hours as needed for cough.   Magnesium 250 MG Tabs Take 250 mg by mouth daily.   methocarbamol 500 MG tablet Commonly known as:  ROBAXIN Take 1 tablet (500 mg total) by mouth every 6 (six) hours as needed for muscle spasms.   montelukast 10 MG tablet Commonly known as: SINGULAIR Take 1 tablet (10 mg total) by mouth at bedtime.   omeprazole 20 MG capsule Commonly known as: PRILOSEC Take 20 mg by mouth daily.   ondansetron 4 MG disintegrating tablet Commonly known as: ZOFRAN-ODT Take 1 tablet (4 mg total) by mouth every 6 (six) hours as needed for nausea.   oxyCODONE 5 MG immediate release tablet Commonly known as: Oxy IR/ROXICODONE Take 1 tablet (5 mg total) by mouth every 4 (four) hours as needed for moderate pain (pain score 4-6).        Follow-up Information     Almond Lint, MD Follow up in 2 week(s).   Specialty: General Surgery Contact information: [address] [place] [address] [phone]                 Signed: Almond Lint 08/01/2023, 8:53 AM

## 2023-08-05 LAB — SURGICAL PATHOLOGY

## 2023-08-25 DIAGNOSIS — R39198 Other difficulties with micturition: Secondary | ICD-10-CM | POA: Insufficient documentation

## 2023-09-16 ENCOUNTER — Ambulatory Visit: Payer: 59 | Admitting: Internal Medicine

## 2023-09-16 VITALS — BP 122/70 | HR 92 | Ht 69.0 in | Wt 193.0 lb

## 2023-09-16 DIAGNOSIS — R1011 Right upper quadrant pain: Secondary | ICD-10-CM

## 2023-09-16 DIAGNOSIS — K219 Gastro-esophageal reflux disease without esophagitis: Secondary | ICD-10-CM | POA: Diagnosis not present

## 2023-09-16 DIAGNOSIS — D214 Benign neoplasm of connective and other soft tissue of abdomen: Secondary | ICD-10-CM

## 2023-09-16 DIAGNOSIS — D649 Anemia, unspecified: Secondary | ICD-10-CM

## 2023-09-16 DIAGNOSIS — R5383 Other fatigue: Secondary | ICD-10-CM

## 2023-09-16 DIAGNOSIS — K9589 Other complications of other bariatric procedure: Secondary | ICD-10-CM | POA: Diagnosis not present

## 2023-09-16 DIAGNOSIS — R6881 Early satiety: Secondary | ICD-10-CM

## 2023-09-16 MED ORDER — OMEPRAZOLE 40 MG PO CPDR: 40.0000 mg | DELAYED_RELEASE_CAPSULE | Freq: Every day | ORAL | 3 refills | Status: DC

## 2023-09-16 NOTE — Progress Notes (Signed)
Patient ID: JLYNN ENBERG, male   DOB: 05-01-1961, 62 y.o.   MRN: [id number] HPI: Discussed the use of AI scribe software for clinical note transcription with the patient, who gave verbal consent to proceed.  History of Present Illness   Friend Vanderpoel is a 62 year old male with a recently resected gastric GIST, history of GERD, diverticulosis who is here for follow-up.  He is here alone today.  The patient, with a history of a gastrointestinal stromal tumor (GIST), underwent a laparoscopic partial gastrectomy in October 2024. The tumor was low-grade, located in the fifth stomach layer, and measured 4.1 cm. Postoperative pathology confirmed the diagnosis and showed negative margins. The patient was not referred to oncology due to the low-risk nature of the GIST and lack of need for adjuvant treatment.  Since the surgery, the patient has been experiencing persistent symptoms, including indigestion, acid reflux, and early satiety. He reports a decreased appetite, particularly in the mornings, and a sensation of 'crashing' if he does not eat. He also notes increased gas and bloating. The patient has been managing these symptoms with over-the-counter omeprazole, which he reports has been somewhat effective in calming his stomach.  Recently he has increased to twice daily and also using Gas-X.  BMs have been regular without blood or melena.  The patient also reports a decrease in energy levels, which he describes as having 'come backwards.' He has an upcoming appointment with a hematologist to evaluate potential anemia. The patient denies any pain, but notes occasional discomfort in the right upper quadrant of the abdomen, which he attributes to overeating or certain foods. He also reports an improvement in his breathing since the surgery, no longer experiencing shortness of breath when bending down.  The patient has been taking over-the-counter Gas-X for bloating and gas, which he reports has been effective. He  expresses concern about his slow recovery and the ongoing impact of his symptoms on his quality of life.     Past Medical History:  Diagnosis Date   Allergy    Anemia    Arthritis    Bleeding gastric ulcer    Complication of anesthesia    CVA (cerebral infarction) 2011   Diverticulosis    GERD (gastroesophageal reflux disease)    prilosec as needed- upset stomach mostly - started after gastric bleeding ulcer   Head concussion    History of blood transfusion 1992   gi bleed   History of kidney stones    Stroke [facility]) 2011    Past Surgical History:  Procedure Laterality Date   APPENDECTOMY     BIOPSY  05/29/2023   Procedure: BIOPSY;  Surgeon: Lemar Lofty., MD;  Location: [facility];  Service: Gastroenterology;;   COLONOSCOPY     10 yrs ago    cyst removal from scrotum     cyst removed from elbow     CYSTOSCOPY/URETEROSCOPY/HOLMIUM LASER/STENT PLACEMENT Left 11/20/2022   Procedure: LEFT URETEROSCOPY/HOLMIUM LASER/STENT PLACEMENT;  Surgeon: Despina Arias, MD;  Location: [facility];  Service: Urology;  Laterality: Left;   ESOPHAGOGASTRODUODENOSCOPY N/A 05/29/2023   Procedure: ESOPHAGOGASTRODUODENOSCOPY (EGD);  Surgeon: Lemar Lofty., MD;  Location: [facility];  Service: Gastroenterology;  Laterality: N/A;   EUS N/A 05/29/2023   Procedure: UPPER ENDOSCOPIC ULTRASOUND (EUS) RADIAL;  Surgeon: Lemar Lofty., MD;  Location: [facility];  Service: Gastroenterology;  Laterality: N/A;   FINE NEEDLE ASPIRATION N/A 05/29/2023   Procedure: FINE NEEDLE ASPIRATION (FNA) LINEAR;  Surgeon: Lemar Lofty., MD;  Location: [facility];  Service: Gastroenterology;  Laterality: N/A;   FINGER FRACTURE SURGERY Right    5th finger   KIDNEY STONE SURGERY     LAPAROSCOPIC GASTRECTOMY N/A 07/30/2023   Procedure: LAPAROSCOPIC PARTIAL GASTRECTOMY;  Surgeon: Almond Lint, MD;  Location: [facility] OR;  Service: General;  Laterality: N/A;  HARMONIC SCALPEL ECHELON STAPLER    NASAL SINUS SURGERY  08/2009   OPEN ANTERIOR SHOULDER RECONSTRUCTION     UPPER GASTROINTESTINAL ENDOSCOPY     10 yrs ago    WISDOM TOOTH EXTRACTION      Outpatient Medications Prior to Visit  Medication Sig Dispense Refill   albuterol (VENTOLIN HFA) 108 (90 Base) MCG/ACT inhaler Inhale 2 puffs into the lungs every 6 (six) hours as needed. 18 g 0   aspirin EC 81 MG tablet Take 81 mg by mouth daily.     azelastine (ASTELIN) 0.1 % nasal spray Place 2 sprays into both nostrils 2 (two) times daily as needed for allergies or rhinitis.     b complex vitamins tablet Take 1 tablet by mouth daily.     cetirizine (ZYRTEC) 10 MG tablet Take 10 mg by mouth daily as needed for allergies.     Coenzyme Q10 (COQ-10 PO) Take 1 capsule by mouth daily.     Collagen Hydrolysate POWD Take 1 Scoop by mouth daily at 6 (six) AM. Collagen powder     diphenhydrAMINE (BENADRYL) 25 mg capsule Take 25-50 mg by mouth at bedtime as needed for sleep or allergies.     fluticasone (FLONASE) 50 MCG/ACT nasal spray Place 2 sprays into both nostrils daily. (Patient taking differently: Place 2 sprays into both nostrils daily as needed for allergies.) 16 g 0   guaiFENesin (MUCINEX) 600 MG 12 hr tablet Take 600 mg by mouth 2 (two) times daily as needed for cough or to loosen phlegm.     guaiFENesin-dextromethorphan (ROBITUSSIN DM) 100-10 MG/5ML syrup Take 5 mLs by mouth every 4 (four) hours as needed for cough.     Magnesium 250 MG TABS Take 250 mg by mouth daily.     omeprazole (PRILOSEC) 20 MG capsule Take 20 mg by mouth daily.     chlorpheniramine-HYDROcodone (TUSSIONEX) 10-8 MG/5ML Take 5 mLs by mouth every 12 (twelve) hours as needed for cough. 70 mL 0   chlorpheniramine-HYDROcodone (TUSSIONEX) 10-8 MG/5ML 5 ml po q 12 hours prn cough 70 mL 0   gabapentin (NEURONTIN) 100 MG capsule Take 100 mg by mouth at bedtime as needed (pain). (Patient not taking: Reported on 09/16/2023)     methocarbamol (ROBAXIN) 500 MG tablet Take 1  tablet (500 mg total) by mouth every 6 (six) hours as needed for muscle spasms. 20 tablet 1   montelukast (SINGULAIR) 10 MG tablet Take 1 tablet (10 mg total) by mouth at bedtime. 30 tablet 0   ondansetron (ZOFRAN-ODT) 4 MG disintegrating tablet Take 1 tablet (4 mg total) by mouth every 6 (six) hours as needed for nausea. (Patient not taking: Reported on 09/16/2023) 20 tablet 0   oxyCODONE (OXY IR/ROXICODONE) 5 MG immediate release tablet Take 1 tablet (5 mg total) by mouth every 4 (four) hours as needed for moderate pain (pain score 4-6). 20 tablet 0   No facility-administered medications prior to visit.    Allergies  Allergen Reactions   Nsaids Other (See Comments)    BLEEDING/ULCER   Lidocaine-Menthol Rash    Only the patches   Prednisone Palpitations    Family History  Problem Relation Age of Onset  Cancer Mother    Endometrial cancer Mother    Cancer Father        Lung CA   Hyperlipidemia Father    Hypertension Father    Lung cancer Father    Stomach cancer Maternal Grandfather    Colon cancer Neg Hx    Colon polyps Neg Hx    Esophageal cancer Neg Hx    Rectal cancer Neg Hx     Social History   Tobacco Use   Smoking status: Never   Smokeless tobacco: Former    Quit date: 1984  Vaping Use   Vaping status: Never Used  Substance Use Topics   Alcohol use: Not Currently   Drug use: No    ROS: As per history of present illness, otherwise negative  BP 122/70   Pulse 92   Ht 5\' 9"  (1.753 m)   Wt 193 lb (87.5 kg)   BMI 28.50 kg/m  Gen: awake, alert, NAD HEENT: anicteric  Abd: soft, NT/ND, +BS throughout, laparoscopic incisions are well-healed Ext: no c/c/e Neuro: nonfocal   RELEVANT LABS AND IMAGING: CBC    Component Value Date/Time   WBC 15.2 (H) 07/31/2023 0838   RBC 5.59 07/31/2023 0838   HGB 16.9 07/31/2023 0838   HGB 17.0 07/23/2023 0948   HCT 48.3 07/31/2023 0838   PLT 222 07/31/2023 0838   PLT 175 07/23/2023 0948   MCV 86.4 07/31/2023 0838    MCH 30.2 07/31/2023 0838   MCHC 35.0 07/31/2023 0838   RDW 13.2 07/31/2023 0838   LYMPHSABS 2.5 07/23/2023 0948   MONOABS 0.5 07/23/2023 0948   EOSABS 0.3 07/23/2023 0948   BASOSABS 0.1 07/23/2023 0948    CMP     Component Value Date/Time   NA 136 07/31/2023 0838   NA 141 02/25/2023 1436   K 4.1 07/31/2023 0838   CL 104 07/31/2023 0838   CO2 22 07/31/2023 0838   GLUCOSE 133 (H) 07/31/2023 0838   BUN 12 07/31/2023 0838   BUN 18 02/25/2023 1436   CREATININE 1.26 (H) 07/31/2023 0838   CREATININE 1.37 (H) 09/10/2018 0844   CALCIUM 9.7 07/31/2023 0838   PROT 6.9 01/06/2023 0749   ALBUMIN 4.3 01/06/2023 0749   AST 24 01/06/2023 0749   ALT 22 01/06/2023 0749   ALKPHOS 56 01/06/2023 0749   BILITOT 0.3 01/06/2023 0749   GFRNONAA >60 07/31/2023 0838   GFRNONAA 75 09/06/2016 0826   GFRAA 87 09/06/2016 0826    Results   RADIOLOGY CT scan: Suspected mass in the stomach (05/29/2023)  DIAGNOSTIC EUS: Intramural subepithelial lesion along the lesser curve from layer five of the stomach Colonoscopy: Diverticulosis in the left colon, otherwise normal (09/2019)  PATHOLOGY Cytology: Spindle cell proliferation, consistent with GIST Gastric biopsies: Normal Low grade GIST, 4.1 cm, negative margins, two mitoses per fifty high power fields (07/30/2023)      ASSESSMENT/PLAN: Assessment and Plan    Gastrointestinal Stromal Tumor (GIST) posterior gastric body Post laparoscopic partial gastrectomy for a low-grade GIST. Pathology showed a 4.1 cm tumor with negative margins and low mitotic index. No adjuvant treatment recommended due to low-risk nature of the tumor. -CT scan in one year for surveillance per Dr. Donell Beers.  Postoperative Gastric Changes Reports of early satiety, indigestion, and acid reflux worsened post-surgery. Likely due to changes in gastric anatomy and gastric accommodation post partial gastrectomy. -Increase Omeprazole to 40mg  daily. -Encourage smaller, more frequent  meals. - Gas-Ex OTC PRN  Anemia Reports of fatigue and lower energy levels.  History of anemia. -To follow-up with hematology tomorrow and probable check iron studies and ferritin levels on 09/17/2023.  Colon cancer screening Normal colonoscopy with exception of left sided diverticulosis in December 2020. No current symptoms. -Repeat screening colonoscopy in December 2030.  Follow-up in 3-4 months to assess postoperative recovery and response to increased Omeprazole dose.      IE:PPIRJJO, Kateri Mc [address] [place],  [place] [address]

## 2023-09-16 NOTE — Patient Instructions (Signed)
We have sent the following medications to your pharmacy for you to pick up at your convenience: Omeprazole   Increase omeprazole to 40 mg once daily   You may use over the counter Gas-X as needed to help with gas and bloating.   Follow-up in 3-4 months. Office will contact you to schedule. If you have not heard from our office in 1-2 months call office at [phone] to schedule appointment.   _______________________________________________________  If your blood pressure at your visit was 140/90 or greater, please contact your primary care physician to follow up on this.  _______________________________________________________  If you are age 65 or older, your body mass index should be between 23-30. Your Body mass index is 28.5 kg/m. If this is out of the aforementioned range listed, please consider follow up with your Primary Care Provider.  If you are age 62 or younger, your body mass index should be between 19-25. Your Body mass index is 28.5 kg/m. If this is out of the aformentioned range listed, please consider follow up with your Primary Care Provider.   ________________________________________________________  The [facility] providers would like to encourage you to use [facility] to communicate with providers for non-urgent requests or questions.  Due to long hold times on the telephone, sending your provider a message by [facility] may be a faster and more efficient way to get a response.  Please allow 48 business hours for a response.  Please remember that this is for non-urgent requests.  _______________________________________________________  Thank you for choosing me and [facility].  Dr. Vonna Kotyk Pyrtle

## 2023-09-17 ENCOUNTER — Inpatient Hospital Stay (HOSPITAL_BASED_OUTPATIENT_CLINIC_OR_DEPARTMENT_OTHER): Payer: 59 | Admitting: Medical Oncology

## 2023-09-17 ENCOUNTER — Inpatient Hospital Stay: Payer: 59 | Attending: Family

## 2023-09-17 ENCOUNTER — Encounter: Payer: Self-pay | Admitting: Medical Oncology

## 2023-09-17 VITALS — BP 139/78 | HR 83 | Temp 97.9°F | Resp 18 | Ht 69.0 in | Wt 192.5 lb

## 2023-09-17 DIAGNOSIS — R5383 Other fatigue: Secondary | ICD-10-CM | POA: Diagnosis not present

## 2023-09-17 DIAGNOSIS — D500 Iron deficiency anemia secondary to blood loss (chronic): Secondary | ICD-10-CM | POA: Insufficient documentation

## 2023-09-17 DIAGNOSIS — Z79899 Other long term (current) drug therapy: Secondary | ICD-10-CM | POA: Diagnosis not present

## 2023-09-17 DIAGNOSIS — Z886 Allergy status to analgesic agent status: Secondary | ICD-10-CM | POA: Diagnosis not present

## 2023-09-17 DIAGNOSIS — D509 Iron deficiency anemia, unspecified: Secondary | ICD-10-CM

## 2023-09-17 DIAGNOSIS — C49A0 Gastrointestinal stromal tumor, unspecified site: Secondary | ICD-10-CM | POA: Diagnosis present

## 2023-09-17 DIAGNOSIS — R202 Paresthesia of skin: Secondary | ICD-10-CM | POA: Diagnosis not present

## 2023-09-17 DIAGNOSIS — R000 Tachycardia, unspecified: Secondary | ICD-10-CM | POA: Diagnosis not present

## 2023-09-17 DIAGNOSIS — K922 Gastrointestinal hemorrhage, unspecified: Secondary | ICD-10-CM | POA: Insufficient documentation

## 2023-09-17 DIAGNOSIS — Z888 Allergy status to other drugs, medicaments and biological substances status: Secondary | ICD-10-CM | POA: Insufficient documentation

## 2023-09-17 LAB — IRON AND IRON BINDING CAPACITY (CC-WL,HP ONLY)
Iron: 156 ug/dL (ref 45–182)
Saturation Ratios: 54 % — ABNORMAL HIGH (ref 17.9–39.5)
TIBC: 291 ug/dL (ref 250–450)
UIBC: 135 ug/dL (ref 117–376)

## 2023-09-17 LAB — CBC WITH DIFFERENTIAL (CANCER CENTER ONLY)
Abs Immature Granulocytes: 0.02 10*3/uL (ref 0.00–0.07)
Basophils Absolute: 0.1 10*3/uL (ref 0.0–0.1)
Basophils Relative: 1 %
Eosinophils Absolute: 0.4 10*3/uL (ref 0.0–0.5)
Eosinophils Relative: 7 %
HCT: 47.3 % (ref 39.0–52.0)
Hemoglobin: 16.3 g/dL (ref 13.0–17.0)
Immature Granulocytes: 0 %
Lymphocytes Relative: 34 %
Lymphs Abs: 2.1 10*3/uL (ref 0.7–4.0)
MCH: 31.1 pg (ref 26.0–34.0)
MCHC: 34.5 g/dL (ref 30.0–36.0)
MCV: 90.3 fL (ref 80.0–100.0)
Monocytes Absolute: 0.6 10*3/uL (ref 0.1–1.0)
Monocytes Relative: 9 %
Neutro Abs: 3 10*3/uL (ref 1.7–7.7)
Neutrophils Relative %: 49 %
Platelet Count: 188 10*3/uL (ref 150–400)
RBC: 5.24 MIL/uL (ref 4.22–5.81)
RDW: 13 % (ref 11.5–15.5)
WBC Count: 6.2 10*3/uL (ref 4.0–10.5)
nRBC: 0 % (ref 0.0–0.2)

## 2023-09-17 LAB — FERRITIN: Ferritin: 141 ng/mL (ref 24–336)

## 2023-09-17 NOTE — Progress Notes (Signed)
Hematology and Oncology Follow Up Visit  Tyler Obrien [id number] November 28, 1960 62 y.o. 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 Meridee Score - Repeat CT in Oct 2025 recommended by Dr. Rhea Belton. Colonoscopy due in Dec 2030.   Current Therapy:   IV iron as indicated- Venofer- last dose 05/02/2023   Interim History:  Tyler Obrien is here today for follow-up.   He had his GIST tumor removed on 07/30/2023. He saw GI yesterday- Dr. Rhea Belton who has increased his omeprazole from 20 mg to 40 mg daily.   Pathology showed a 4.1 cm tumor with negative margins and low mitotic index. No adjuvant treatment recommended due to low-risk nature of the tumor. -CT scan in one year for surveillance per Dr. Donell Beers. No blood loss noted. No bruising or petechiae. He reports that he tolerated the iron infusions well. They helped his fatigue, nerve pain and racing heart sensation.  No fever, chills, n/v, cough, rash, dizziness, SOB, chest pain or changes in bladder habits.  No swelling in his extremities.  He has occasional tingling in his left arm and sides of his feet.  No falls or syncope reported.   Wt Readings from Last 3 Encounters:  09/17/23 192 lb 8 oz (87.3 kg)  09/16/23 193 lb (87.5 kg)  07/30/23 190 lb (86.2 kg)   ECOG Performance Status: 1 - Symptomatic but completely ambulatory  Medications:  Allergies as of 09/17/2023       Reactions   Nsaids Other (See Comments)   BLEEDING/ULCER   Lidocaine-menthol Rash   Only the patches   Prednisone Palpitations        Medication List        Accurate as of September 17, 2023 10:47 AM. If you have any questions, ask your nurse or doctor.          STOP taking these medications    guaiFENesin-dextromethorphan 100-10 MG/5ML syrup Commonly known as: ROBITUSSIN DM Stopped by: Rushie Chestnut       TAKE these medications    albuterol 108 (90 Base) MCG/ACT inhaler Commonly known as: VENTOLIN  HFA Inhale 2 puffs into the lungs every 6 (six) hours as needed.   aspirin EC 81 MG tablet Take 81 mg by mouth daily.   azelastine 0.1 % nasal spray Commonly known as: ASTELIN Place 2 sprays into both nostrils 2 (two) times daily as needed for allergies or rhinitis.   b complex vitamins tablet Take 1 tablet by mouth daily.   cetirizine 10 MG tablet Commonly known as: ZYRTEC Take 10 mg by mouth daily as needed for allergies.   Collagen Hydrolysate Powd Take 1 Scoop by mouth daily at 6 (six) AM. Collagen powder   COQ-10 PO Take 1 capsule by mouth daily.   diphenhydrAMINE 25 mg capsule Commonly known as: BENADRYL Take 25-50 mg by mouth at bedtime as needed for sleep or allergies.   fluticasone 50 MCG/ACT nasal spray Commonly known as: FLONASE Place 2 sprays into both nostrils daily. What changed:  when to take this reasons to take this   gabapentin 100 MG capsule Commonly known as: NEURONTIN Take 100 mg by mouth at bedtime as needed (pain).   guaiFENesin 600 MG 12 hr tablet Commonly known as: MUCINEX Take 600 mg by mouth 2 (two) times daily as needed for cough or to loosen phlegm.   Magnesium 250 MG Tabs Take 250 mg by mouth daily.   omeprazole 40 MG capsule Commonly known as:  PRILOSEC Take 1 capsule (40 mg total) by mouth daily.   ondansetron 4 MG disintegrating tablet Commonly known as: ZOFRAN-ODT Take 1 tablet (4 mg total) by mouth every 6 (six) hours as needed for nausea.        Allergies:  Allergies  Allergen Reactions   Nsaids Other (See Comments)    BLEEDING/ULCER   Lidocaine-Menthol Rash    Only the patches   Prednisone Palpitations    Past Medical History, Surgical history, Social history, and Family History were reviewed and updated.  Review of Systems: All other 10 point review of systems is negative.   Physical Exam:  height is 5\' 9"  (1.753 m) and weight is 192 lb 8 oz (87.3 kg). His oral temperature is 97.9 F (36.6 C). His blood  pressure is 139/78 and his pulse is 83. His respiration is 18 and oxygen saturation is 95%.   Wt Readings from Last 3 Encounters:  09/17/23 192 lb 8 oz (87.3 kg)  09/16/23 193 lb (87.5 kg)  07/30/23 190 lb (86.2 kg)    Ocular: Sclerae unicteric, pupils equal, round and reactive to light Ear-nose-throat: Oropharynx clear, dentition fair Lymphatic: No cervical or supraclavicular adenopathy Lungs no rales or rhonchi, good excursion bilaterally Heart regular rate and rhythm, no murmur appreciated Abd soft, nontender, positive bowel sounds MSK no focal spinal tenderness, no joint edema Neuro: non-focal, well-oriented, appropriate affect   Lab Results  Component Value Date   WBC 6.2 09/17/2023   HGB 16.3 09/17/2023   HCT 47.3 09/17/2023   MCV 90.3 09/17/2023   PLT 188 09/17/2023   Lab Results  Component Value Date   FERRITIN 199 07/23/2023   IRON 146 07/23/2023   TIBC 290 07/23/2023   UIBC 144 07/23/2023   IRONPCTSAT 50 (H) 07/23/2023   Lab Results  Component Value Date   RETICCTPCT 1.0 07/23/2023   RBC 5.24 09/17/2023   No results found for: "KPAFRELGTCHN", "LAMBDASER", "KAPLAMBRATIO" No results found for: "IGGSERUM", "IGA", "IGMSERUM" No results found for: "TOTALPROTELP", "ALBUMINELP", "A1GS", "A2GS", "BETS", "BETA2SER", "GAMS", "MSPIKE", "SPEI"   Chemistry      Component Value Date/Time   NA 136 07/31/2023 0838   NA 141 02/25/2023 1436   K 4.1 07/31/2023 0838   CL 104 07/31/2023 0838   CO2 22 07/31/2023 0838   BUN 12 07/31/2023 0838   BUN 18 02/25/2023 1436   CREATININE 1.26 (H) 07/31/2023 0838   CREATININE 1.37 (H) 09/10/2018 0844      Component Value Date/Time   CALCIUM 9.7 07/31/2023 0838   ALKPHOS 56 01/06/2023 0749   AST 24 01/06/2023 0749   ALT 22 01/06/2023 0749   BILITOT 0.3 01/06/2023 0749      Encounter Diagnosis  Name Primary?   Iron deficiency anemia, unspecified iron deficiency anemia type Yes    Impression and Plan: Tyler Obrien is a  pleasant 62 yo gentleman with recent diagnosis of IDA and GIST. Currently on PRN IV iron.   Today his Hgb is 16.3 with MCV of 90.3 Iron studies are pending. Will replace if needed.  RTC 3 months APP, lab (CBC, iron, ferritin) -Irena   Rushie Chestnut, PA-C 12/11/202410:47 AM

## 2023-09-20 ENCOUNTER — Telehealth: Payer: Self-pay | Admitting: Medical

## 2023-09-21 NOTE — Telephone Encounter (Signed)
Can this pt be scheduled 1 pm on tuesday and given 2 appt slots/ 40 minutes. Ask him to arrive early. Every time see pt address 5-7 problems. On review recently he is not doing well post surgery and expect will again need 40 minutes. He is scheduled presntly at 8 am for 20 minutes. Thanks

## 2023-09-22 NOTE — Telephone Encounter (Signed)
Pt scheduled for 09/23/23 at 1:00 pm with 40 min slot

## 2023-09-23 ENCOUNTER — Ambulatory Visit: Payer: 59 | Admitting: Medical

## 2023-09-23 ENCOUNTER — Encounter: Payer: Self-pay | Admitting: Medical

## 2023-09-23 VITALS — BP 139/80 | HR 82 | Resp 18 | Ht 69.0 in | Wt 191.4 lb

## 2023-09-23 DIAGNOSIS — R5383 Other fatigue: Secondary | ICD-10-CM

## 2023-09-23 DIAGNOSIS — J3489 Other specified disorders of nose and nasal sinuses: Secondary | ICD-10-CM | POA: Diagnosis not present

## 2023-09-23 DIAGNOSIS — R0683 Snoring: Secondary | ICD-10-CM

## 2023-09-23 DIAGNOSIS — J309 Allergic rhinitis, unspecified: Secondary | ICD-10-CM | POA: Diagnosis not present

## 2023-09-23 DIAGNOSIS — D649 Anemia, unspecified: Secondary | ICD-10-CM | POA: Diagnosis not present

## 2023-09-23 DIAGNOSIS — C49A2 Gastrointestinal stromal tumor of stomach: Secondary | ICD-10-CM

## 2023-09-23 MED ORDER — AZITHROMYCIN 250 MG PO TABS
ORAL_TABLET | ORAL | 0 refills | Status: AC
Start: 2023-09-23 — End: 2023-09-28

## 2023-09-23 MED ORDER — AZITHROMYCIN 250 MG PO TABS
ORAL_TABLET | ORAL | 0 refills | Status: DC
Start: 2023-09-23 — End: 2023-09-23

## 2023-09-23 NOTE — Patient Instructions (Signed)
Post-Gastrectomy Syndrome Altered eating habits and inconsistent digestion following partial gastrectomy for GIST. Early in day satiety but late in day appetite alot. Omeprazole dose reduced due to stomach discomfort. -Continue Omeprazole 20mg  daily. -Consider splitting dose to 20 twice daily if needed.  Gastrointestinal Stromal Tumor (GIST) Low-grade tumor removed with negative margins. No expectation of recurrence. -Schedule surveillance CT scan in 1 year with surgeon.  Anemia Hemoglobin and iron levels within normal range. No need for iron supplementation. -Repeat blood work in 3 months.  Sinus Congestion Possible sinus infection with teeth pain and congestion. No colored mucus or cough. -Continue Flonase and consider adding Azelastine. -If symptoms persist or worsen, start Azithromycin (5-day course) which will be made available.  Fatigue Persistent fatigue post-surgery. Normal B12, thyroid, and testosterone levels. History of prolonged recovery from surgeries. -Consider referral for sleep study if symptoms persist. -Continue current exercise regimen as tolerated. -consider repeat fatigue labs in future if energy not recovering. Also consider sleep study if snoring noted to be worse.  Follow up 6 month or sooner if needed.

## 2023-09-23 NOTE — Progress Notes (Signed)
Subjective:    Patient ID: Tyler Obrien, male    DOB: 1961/05/01, 62 y.o.   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 He reports inconsistent eating patterns, with a lack of appetite in the morning, followed by a surge of hunger in the late afternoon. He notes that food does not taste as it used to, although this is gradually improving. Despite these changes, he denies early satiety or reflux symptoms, attributing his discomfort to stomach aches. He has been managing these symptoms with omeprazole 20mg  daily, having reduced the dose from 40mg  due to stomach discomfort. He also uses Gas-X as needed.  The patient also reports fatigue, particularly after physical activity such as walking. He notes that this fatigue is similar to his experience after previous surgeries, including sinus surgery and an appendectomy. He reports some mild  snoring but not significantly.  In addition to these issues, the patient has been experiencing sinus pressure and congestion for approximately a week, which he believes to be a cold rather than allergies. He reports some sinus pain and toothache, but denies cough or colored mucus. He manages his sinus symptoms with Flonase and occasionally Azelastine.  The patient's anemia appears to be stable, with a recent hemoglobin level of 16.3 and iron level of 156. He is not currently requiring iron supplementation. His testosterone level was also noted to be in the upper third range.       Review of Systems  Constitutional:  Negative for chills, fatigue and fever.  HENT:  Positive for congestion, postnasal drip and sinus pressure. Negative for ear pain.   Respiratory:  Negative for cough, chest tightness, shortness of breath and wheezing.   Cardiovascular:  Negative for chest pain and palpitations.  Gastrointestinal:  Negative for abdominal pain,  constipation, nausea and vomiting.  Genitourinary:  Negative for dysuria and flank pain.  Musculoskeletal:  Negative for back pain and neck pain.  Skin:  Negative for rash.  Neurological:  Negative for seizures, facial asymmetry and weakness.  Hematological:  Negative for adenopathy. Does not bruise/bleed easily.    Past Medical History:  Diagnosis Date   Allergy    Anemia    Arthritis    Bleeding gastric ulcer    Complication of anesthesia    CVA (cerebral infarction) 2011   Diverticulosis    GERD (gastroesophageal reflux disease)    prilosec as needed- upset stomach mostly - started after gastric bleeding ulcer   Head concussion    History of blood transfusion 1992   gi bleed   History of kidney stones    Stroke [facility]) 2011     Social History   Socioeconomic History   Marital status: Married    Spouse name: Not on file   Number of children: Not on file   Years of education: Not on file   Highest education level: Bachelor's degree (e.g., BA, AB, BS)  Occupational History   Not on file  Tobacco Use   Smoking status: Never   Smokeless tobacco: Former    Quit date: 1984  Vaping Use   Vaping status: Never Used  Substance and Sexual Activity   Alcohol use: Not Currently   Drug use: No   Sexual activity: Not Currently  Other Topics Concern   Not on file  Social History Narrative   Not on file   Social Drivers of Health   Financial  Resource Strain: Low Risk  (09/22/2023)   Overall Financial Resource Strain (CARDIA)    Difficulty of Paying Living Expenses: Not very hard  Food Insecurity: No Food Insecurity (09/22/2023)   Hunger Vital Sign    Worried About Running Out of Food in the Last Year: Never true    Ran Out of Food in the Last Year: Never true  Transportation Needs: No Transportation Needs (09/22/2023)   PRAPARE - Administrator, Civil Service (Medical): No    Lack of Transportation (Non-Medical): No  Physical Activity: Insufficiently Active  (09/22/2023)   Exercise Vital Sign    Days of Exercise per Week: 1 day    Minutes of Exercise per Session: 20 min  Stress: Stress Concern Present (09/22/2023)   [employer] of Occupational Health - Occupational Stress Questionnaire    Feeling of Stress : To some extent  Social Connections: Socially Integrated (09/22/2023)   Social Connection and Isolation Panel [NHANES]    Frequency of Communication with Friends and Family: More than three times a week    Frequency of Social Gatherings with Friends and Family: More than three times a week    Attends Religious Services: More than 4 times per year    Active Member of Clubs or Organizations: Yes    Attends Banker Meetings: More than 4 times per year    Marital Status: Married  Catering manager Violence: Not At Risk (07/30/2023)   Humiliation, Afraid, Rape, and Kick questionnaire    Fear of Current or Ex-Partner: No    Emotionally Abused: No    Physically Abused: No    Sexually Abused: No    Past Surgical History:  Procedure Laterality Date   APPENDECTOMY     BIOPSY  05/29/2023   Procedure: BIOPSY;  Surgeon: Lemar Lofty., MD;  Location: [facility];  Service: Gastroenterology;;   COLONOSCOPY     10 yrs ago    cyst removal from scrotum     cyst removed from elbow     CYSTOSCOPY/URETEROSCOPY/HOLMIUM LASER/STENT PLACEMENT Left 11/20/2022   Procedure: LEFT URETEROSCOPY/HOLMIUM LASER/STENT PLACEMENT;  Surgeon: Despina Arias, MD;  Location: [facility];  Service: Urology;  Laterality: Left;   ESOPHAGOGASTRODUODENOSCOPY N/A 05/29/2023   Procedure: ESOPHAGOGASTRODUODENOSCOPY (EGD);  Surgeon: Lemar Lofty., MD;  Location: [facility];  Service: Gastroenterology;  Laterality: N/A;   EUS N/A 05/29/2023   Procedure: UPPER ENDOSCOPIC ULTRASOUND (EUS) RADIAL;  Surgeon: Lemar Lofty., MD;  Location: [facility];  Service: Gastroenterology;  Laterality: N/A;   FINE NEEDLE ASPIRATION N/A 05/29/2023    Procedure: FINE NEEDLE ASPIRATION (FNA) LINEAR;  Surgeon: Lemar Lofty., MD;  Location: [facility];  Service: Gastroenterology;  Laterality: N/A;   FINGER FRACTURE SURGERY Right    5th finger   KIDNEY STONE SURGERY     LAPAROSCOPIC GASTRECTOMY N/A 07/30/2023   Procedure: LAPAROSCOPIC PARTIAL GASTRECTOMY;  Surgeon: Almond Lint, MD;  Location: [facility];  Service: General;  Laterality: N/A;  HARMONIC SCALPEL ECHELON STAPLER   NASAL SINUS SURGERY  08/2009   OPEN ANTERIOR SHOULDER RECONSTRUCTION     UPPER GASTROINTESTINAL ENDOSCOPY     10 yrs ago    WISDOM TOOTH EXTRACTION      Family History  Problem Relation Age of Onset   Cancer Mother    Endometrial cancer Mother    Cancer Father        Lung CA   Hyperlipidemia Father    Hypertension Father    Lung cancer Father  Stomach cancer Maternal Grandfather    Colon cancer Neg Hx    Colon polyps Neg Hx    Esophageal cancer Neg Hx    Rectal cancer Neg Hx     Allergies  Allergen Reactions   Nsaids Other (See Comments)    BLEEDING/ULCER   Lidocaine-Menthol Rash    Only the patches   Prednisone Palpitations    Current Outpatient Medications on File Prior to Visit  Medication Sig Dispense Refill   albuterol (VENTOLIN HFA) 108 (90 Base) MCG/ACT inhaler Inhale 2 puffs into the lungs every 6 (six) hours as needed. 18 g 0   aspirin EC 81 MG tablet Take 81 mg by mouth daily.     azelastine (ASTELIN) 0.1 % nasal spray Place 2 sprays into both nostrils 2 (two) times daily as needed for allergies or rhinitis.     b complex vitamins tablet Take 1 tablet by mouth daily.     cetirizine (ZYRTEC) 10 MG tablet Take 10 mg by mouth daily as needed for allergies.     Coenzyme Q10 (COQ-10 PO) Take 1 capsule by mouth daily.     Collagen Hydrolysate POWD Take 1 Scoop by mouth daily at 6 (six) AM. Collagen powder     diphenhydrAMINE (BENADRYL) 25 mg capsule Take 25-50 mg by mouth at bedtime as needed for sleep or allergies.     fluticasone  (FLONASE) 50 MCG/ACT nasal spray Place 2 sprays into both nostrils daily. (Patient taking differently: Place 2 sprays into both nostrils daily as needed for allergies.) 16 g 0   gabapentin (NEURONTIN) 100 MG capsule Take 100 mg by mouth at bedtime as needed (pain). (Patient not taking: Reported on 09/16/2023)     guaiFENesin (MUCINEX) 600 MG 12 hr tablet Take 600 mg by mouth 2 (two) times daily as needed for cough or to loosen phlegm.     Magnesium 250 MG TABS Take 250 mg by mouth daily.     omeprazole (PRILOSEC) 40 MG capsule Take 1 capsule (40 mg total) by mouth daily. 90 capsule 3   ondansetron (ZOFRAN-ODT) 4 MG disintegrating tablet Take 1 tablet (4 mg total) by mouth every 6 (six) hours as needed for nausea. (Patient not taking: Reported on 09/16/2023) 20 tablet 0   No current facility-administered medications on file prior to visit.    BP (!) 140/82   Pulse 82   Resp 18   Ht 5\' 9"  (1.753 m)   Wt 191 lb 6.4 oz (86.8 kg)   SpO2 98%   BMI 28.26 kg/m        Objective:   Physical Exam General Mental Status- Alert. General Appearance- Not in acute distress.   Skin General: Color- Normal Color. Moisture- Normal Moisture.  Neck No JVD.  Chest and Lung Exam Auscultation: Breath Sounds:-Normal.  Cardiovascular Auscultation:Rythm- Regular. Murmurs & Other Heart Sounds:Auscultation of the heart reveals- No Murmurs.  Abdomen Inspection:-Inspeection Normal. Palpation/Percussion:Note:No mass. Palpation and Percussion of the abdomen reveal- Non Tender, Non Distended + BS, no rebound or guarding.   Neurologic Cranial Nerve exam:- CN III-XII intact(No nystagmus), symmetric smile. Strength:- 5/5 equal and symmetric strength both upper and lower extremities.   Heent- +pnd. Mild sinus pressure. Normal pharynx.     Assessment & Plan:   Patient Instructions  Post-Gastrectomy Syndrome Altered eating habits and inconsistent digestion following partial gastrectomy for GIST. Early  in day satiety but late in day appetite alot. Omeprazole dose reduced due to stomach discomfort. -Continue Omeprazole 20mg  daily. -Consider splitting dose  to 20 twice daily if needed.  Gastrointestinal Stromal Tumor (GIST) Low-grade tumor removed with negative margins. No expectation of recurrence. -Schedule surveillance CT scan in 1 year with surgeon.  Anemia Hemoglobin and iron levels within normal range. No need for iron supplementation. -Repeat blood work in 3 months.  Sinus Congestion Possible sinus infection with teeth pain and congestion. No colored mucus or cough. -Continue Flonase and consider adding Azelastine. -If symptoms persist or worsen, start Azithromycin (5-day course) which will be made available.  Fatigue Persistent fatigue post-surgery. Normal B12, thyroid, and testosterone levels. History of prolonged recovery from surgeries. -Consider referral for sleep study if symptoms persist. -Continue current exercise regimen as tolerated. -consider repeat fatigue labs in future if energy not recovering. Also consider sleep study if snoring noted to be worse.  Follow up 6 month or sooner if needed.   Esperanza Richters, PA-C   Time spent with patient today was 40  minutes which consisted of chart revdiew, discussing diagnosis, work up treatment and documentation.

## 2023-12-10 ENCOUNTER — Encounter: Payer: Self-pay | Admitting: Family

## 2023-12-17 ENCOUNTER — Ambulatory Visit: Payer: 59 | Admitting: Medical Oncology

## 2023-12-17 ENCOUNTER — Inpatient Hospital Stay: Payer: Self-pay

## 2023-12-22 ENCOUNTER — Ambulatory Visit: Payer: 59 | Admitting: Dermatology

## 2024-02-13 ENCOUNTER — Telehealth: Payer: Self-pay | Admitting: Nurse Practitioner

## 2024-02-13 DIAGNOSIS — J0140 Acute pansinusitis, unspecified: Secondary | ICD-10-CM

## 2024-02-13 MED ORDER — ALBUTEROL SULFATE HFA 108 (90 BASE) MCG/ACT IN AERS: 2.0000 | INHALATION_SPRAY | Freq: Four times a day (QID) | RESPIRATORY_TRACT | 0 refills | Status: DC | PRN

## 2024-02-13 MED ORDER — DOXYCYCLINE HYCLATE 100 MG PO TABS: 100.0000 mg | ORAL_TABLET | Freq: Two times a day (BID) | ORAL | 0 refills | Status: AC

## 2024-02-13 NOTE — Progress Notes (Signed)
 Virtual Visit Consent   Tyler Obrien, you are scheduled for a virtual visit with a [facility] provider today. Just as with appointments in the office, your consent must be obtained to participate. Your consent will be active for this visit and any virtual visit you may have with one of our providers in the next 365 days. If you have a MyChart account, a copy of this consent can be sent to you electronically.  As this is a virtual visit, video technology does not allow for your provider to perform a traditional examination. This may limit your provider's ability to fully assess your condition. If your provider identifies any concerns that need to be evaluated in person or the need to arrange testing (such as labs, EKG, etc.), we will make arrangements to do so. Although advances in technology are sophisticated, we cannot ensure that it will always work on either your end or our end. If the connection with a video visit is poor, the visit may have to be switched to a telephone visit. With either a video or telephone visit, we are not always able to ensure that we have a secure connection.  By engaging in this virtual visit, you consent to the provision of healthcare and authorize for your insurance to be billed (if applicable) for the services provided during this visit. Depending on your insurance coverage, you may receive a charge related to this service.  I need to obtain your verbal consent now. Are you willing to proceed with your visit today? [name] has provided verbal consent on 02/13/2024 for a virtual visit (video or telephone). Mardene Shake, FNP  Date: 02/13/2024 9:29 AM   Virtual Visit via Video Note   I, Mardene Shake, connected with  Tyler Obrien  ([id number], Jul 15, 1961) on 02/13/24 at  9:30 AM EDT by a video-enabled telemedicine application and verified that I am speaking with the correct person using two identifiers.  Location: Patient: Virtual Visit Location Patient:  Home Provider: Virtual Visit Location Provider: Home Office   I discussed the limitations of evaluation and management by telemedicine and the availability of in person appointments. The patient expressed understanding and agreed to proceed.    History of Present Illness: Tyler Obrien is a 63 y.o. who identifies as a male who was assigned male at birth, and is being seen today for ongoing pressure in his head, low grade fevers and fatigue   He is not blowing his nose/just feels the sinus pressure  Today he has started to notice some more chest involvement   2 weeks ago he had a fever up to 102  He did take a home COVID test that was negative at that time  He did use flonase  today and has been using that regularly  Uses Zyrtec intermittently  He has used an inhaler in the past not regularly  Prednisone  has used in the past caused tachycardia   He tried sudafed yesterday without any effect   Problems:  Patient Active Problem List   Diagnosis Date Noted   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 (HCC) 07/30/2023   Coronary artery disease minimal disease based on coronary scheduled from 2024 05/13/2023   IDA (iron  deficiency anemia) 04/04/2023   Palpitations 02/17/2023   Atypical chest pain 02/17/2023   Dyspnea on exertion 02/17/2023   Dyslipidemia 02/17/2023   Late effect of cerebrovascular accident (CVA) 02/17/2023   DDD (degenerative disc disease), cervical 03/24/2019   Right shoulder pain 02/24/2019  Cyst of scrotum 08/27/2016   Bladder pain 08/27/2016   Microcytosis 08/27/2016   Vitamin D  deficiency 08/27/2016   Ethmoid sinusitis 08/27/2016   Kidney stone 08/21/2015    Allergies:  Allergies  Allergen Reactions   Nsaids Other (See Comments)    BLEEDING/ULCER   Lidocaine -Menthol Rash    Only the patches   Prednisone  Palpitations   Medications:  Current Outpatient Medications:    albuterol  (VENTOLIN  HFA) 108 (90 Base) MCG/ACT inhaler, Inhale 2  puffs into the lungs every 6 (six) hours as needed., Disp: 18 g, Rfl: 0   aspirin EC 81 MG tablet, Take 81 mg by mouth daily., Disp: , Rfl:    azelastine  (ASTELIN ) 0.1 % nasal spray, Place 2 sprays into both nostrils 2 (two) times daily as needed for allergies or rhinitis., Disp: , Rfl:    b complex vitamins tablet, Take 1 tablet by mouth daily., Disp: , Rfl:    cetirizine (ZYRTEC) 10 MG tablet, Take 10 mg by mouth daily as needed for allergies., Disp: , Rfl:    Coenzyme Q10 (COQ-10 PO), Take 1 capsule by mouth daily., Disp: , Rfl:    Collagen Hydrolysate POWD, Take 1 Scoop by mouth daily at 6 (six) AM. Collagen powder, Disp: , Rfl:    diphenhydrAMINE  (BENADRYL ) 25 mg capsule, Take 25-50 mg by mouth at bedtime as needed for sleep or allergies., Disp: , Rfl:    fluticasone  (FLONASE ) 50 MCG/ACT nasal spray, Place 2 sprays into both nostrils daily. (Patient taking differently: Place 2 sprays into both nostrils daily as needed for allergies.), Disp: 16 g, Rfl: 0   gabapentin  (NEURONTIN ) 100 MG capsule, Take 100 mg by mouth at bedtime as needed (pain). (Patient not taking: Reported on 09/16/2023), Disp: , Rfl:    guaiFENesin (MUCINEX) 600 MG 12 hr tablet, Take 600 mg by mouth 2 (two) times daily as needed for cough or to loosen phlegm., Disp: , Rfl:    Magnesium 250 MG TABS, Take 250 mg by mouth daily., Disp: , Rfl:    omeprazole  (PRILOSEC) 40 MG capsule, Take 1 capsule (40 mg total) by mouth daily., Disp: 90 capsule, Rfl: 3   ondansetron  (ZOFRAN -ODT) 4 MG disintegrating tablet, Take 1 tablet (4 mg total) by mouth every 6 (six) hours as needed for nausea. (Patient not taking: Reported on 09/16/2023), Disp: 20 tablet, Rfl: 0  Observations/Objective: Patient is well-developed, well-nourished in no acute distress.  Resting comfortably  at home.  Head is normocephalic, atraumatic.  No labored breathing.  Speech is clear and coherent with logical content.  Patient is alert and oriented at baseline.     Assessment and Plan:  1. Acute non-recurrent pansinusitis Claritin and Flonase  daily   Push fluids  Meds ordered this encounter  Medications   albuterol  (VENTOLIN  HFA) 108 (90 Base) MCG/ACT inhaler    Sig: Inhale 2 puffs into the lungs every 6 (six) hours as needed for wheezing or shortness of breath.    Dispense:  8 g    Refill:  0   doxycycline  (VIBRA -TABS) 100 MG tablet    Sig: Take 1 tablet (100 mg total) by mouth 2 (two) times daily for 10 days.    Dispense:  20 tablet    Refill:  0    Follow Up Instructions: I discussed the assessment and treatment plan with the patient. The patient was provided an opportunity to ask questions and all were answered. The patient agreed with the plan and demonstrated an understanding of the instructions.  A copy of instructions were sent to the patient via MyChart unless otherwise noted below.    The patient was advised to call back or seek an in-person evaluation if the symptoms worsen or if the condition fails to improve as anticipated.    Mardene Shake, FNP

## 2024-03-09 ENCOUNTER — Encounter: Payer: Self-pay | Admitting: Medical

## 2024-03-09 ENCOUNTER — Ambulatory Visit: Payer: Self-pay | Admitting: Medical

## 2024-03-09 ENCOUNTER — Encounter: Payer: Self-pay | Admitting: Family

## 2024-03-09 ENCOUNTER — Ambulatory Visit (INDEPENDENT_AMBULATORY_CARE_PROVIDER_SITE_OTHER): Payer: Self-pay | Admitting: Medical

## 2024-03-09 VITALS — BP 136/80 | HR 78 | Resp 18 | Ht 69.0 in | Wt 185.0 lb

## 2024-03-09 DIAGNOSIS — R000 Tachycardia, unspecified: Secondary | ICD-10-CM | POA: Diagnosis not present

## 2024-03-09 DIAGNOSIS — R5383 Other fatigue: Secondary | ICD-10-CM | POA: Diagnosis not present

## 2024-03-09 DIAGNOSIS — Z8673 Personal history of transient ischemic attack (TIA), and cerebral infarction without residual deficits: Secondary | ICD-10-CM

## 2024-03-09 DIAGNOSIS — Z125 Encounter for screening for malignant neoplasm of prostate: Secondary | ICD-10-CM

## 2024-03-09 DIAGNOSIS — H9313 Tinnitus, bilateral: Secondary | ICD-10-CM

## 2024-03-09 DIAGNOSIS — D649 Anemia, unspecified: Secondary | ICD-10-CM

## 2024-03-09 DIAGNOSIS — F819 Developmental disorder of scholastic skills, unspecified: Secondary | ICD-10-CM

## 2024-03-09 DIAGNOSIS — E785 Hyperlipidemia, unspecified: Secondary | ICD-10-CM

## 2024-03-09 DIAGNOSIS — R413 Other amnesia: Secondary | ICD-10-CM | POA: Diagnosis not present

## 2024-03-09 DIAGNOSIS — R002 Palpitations: Secondary | ICD-10-CM

## 2024-03-09 LAB — COMPREHENSIVE METABOLIC PANEL WITH GFR
ALT: 30 U/L (ref 0–53)
AST: 73 U/L — ABNORMAL HIGH (ref 0–37)
Albumin: 4.5 g/dL (ref 3.5–5.2)
Alkaline Phosphatase: 50 U/L (ref 39–117)
BUN: 16 mg/dL (ref 6–23)
CO2: 29 meq/L (ref 19–32)
Calcium: 9.7 mg/dL (ref 8.4–10.5)
Chloride: 103 meq/L (ref 96–112)
Creatinine, Ser: 1.17 mg/dL (ref 0.40–1.50)
GFR: 65.66 mL/min (ref >60.00)
Glucose, Bld: 95 mg/dL (ref 70–99)
Potassium: 4.6 meq/L (ref 3.5–5.1)
Sodium: 137 meq/L (ref 135–145)
Total Bilirubin: 0.6 mg/dL (ref 0.2–1.2)
Total Protein: 7.1 g/dL (ref 6.0–8.3)

## 2024-03-09 LAB — CBC WITH DIFFERENTIAL/PLATELET
Basophils Absolute: 0 10*3/uL (ref 0.0–0.1)
Basophils Relative: 0.8 % (ref 0.0–3.0)
Eosinophils Absolute: 0.1 10*3/uL (ref 0.0–0.7)
Eosinophils Relative: 2.5 % (ref 0.0–5.0)
HCT: 48.5 % (ref 39.0–52.0)
Hemoglobin: 16.4 g/dL (ref 13.0–17.0)
Lymphocytes Relative: 33.5 % (ref 12.0–46.0)
Lymphs Abs: 1.8 10*3/uL (ref 0.7–4.0)
MCHC: 33.9 g/dL (ref 30.0–36.0)
MCV: 90.1 fl (ref 78.0–100.0)
Monocytes Absolute: 0.5 10*3/uL (ref 0.1–1.0)
Monocytes Relative: 8.7 % (ref 3.0–12.0)
Neutro Abs: 2.9 10*3/uL (ref 1.4–7.7)
Neutrophils Relative %: 54.5 % (ref 43.0–77.0)
Platelets: 157 10*3/uL (ref 150.0–400.0)
RBC: 5.38 Mil/uL (ref 4.22–5.81)
RDW: 13.6 % (ref 11.5–15.5)
WBC: 5.3 10*3/uL (ref 4.0–10.5)

## 2024-03-09 LAB — LIPID PANEL
Cholesterol: 294 mg/dL — ABNORMAL HIGH (ref 0–200)
HDL: 47.1 mg/dL (ref >39.00)
LDL Cholesterol: 211 mg/dL — ABNORMAL HIGH (ref 0–99)
NonHDL: 247.08
Total CHOL/HDL Ratio: 6
Triglycerides: 178 mg/dL — ABNORMAL HIGH (ref 0.0–149.0)
VLDL: 35.6 mg/dL (ref 0.0–40.0)

## 2024-03-09 LAB — VITAMIN B12: Vitamin B-12: 483 pg/mL (ref 211–911)

## 2024-03-09 LAB — TSH: TSH: 1.22 u[IU]/mL (ref 0.35–5.50)

## 2024-03-09 LAB — T4, FREE: Free T4: 0.94 ng/dL (ref 0.60–1.60)

## 2024-03-09 MED ORDER — FEXOFENADINE HCL 60 MG PO TABS: 60.0000 mg | ORAL_TABLET | Freq: Two times a day (BID) | ORAL | 0 refills | Status: DC

## 2024-03-09 NOTE — Progress Notes (Signed)
 Subjective:    Patient ID: Tyler Obrien, male    DOB: October 10, 1960, 63 y.o.   MRN: [id number]  HPI  Pt in for follow up.  Pt ha seen both GI MD and hematologist back in December. 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 Pathology showed a 4.1 cm tumor with negative margins and low mitotic index. No adjuvant treatment recommended due to low-risk nature of the tumor. -CT scan in one year for surveillance per Dr. Cherlynn Cornfield.   Postoperative Gastric Changes Reports of early satiety, indigestion, and acid reflux worsened post-surgery. Likely due to changes in gastric anatomy and gastric accommodation post partial gastrectomy. -Increase Omeprazole  to 40mg  daily. -Encourage smaller, more frequent meals. - Gas-Ex OTC PRN   Anemia Reports of fatigue and lower energy levels. History of anemia. -To follow-up with hematology tomorrow and probable check iron  studies and ferritin levels on 09/17/2023.   Colon cancer screening Normal colonoscopy with exception of left sided diverticulosis in December 2020. No current symptoms. -Repeat screening colonoscopy in December 2030.   Follow-up in 3-4 months to assess postoperative recovery and response to increased Omeprazole  dose. "  Hematologist note below in "  "Impression and Plan: Tyler Obrien is a pleasant 63 yo gentleman with recent diagnosis of IDA and GIST. Currently on PRN IV iron .    Today his Hgb is 16.3 with MCV of 90.3 Iron  studies are pending. Will replace if needed.  RTC 3 months APP, lab (CBC, iron , ferritin) -[name] "      Review of Systems  Constitutional:  Negative for appetite change, diaphoresis and fatigue.  HENT:  Negative for congestion and ear pain.        Tinnitus. Pt had for 6 weeks. Bilateral both sides.  Respiratory:  Negative for cough, chest tightness, shortness of breath and wheezing.   Cardiovascular:  Negative for  chest pain and palpitations.  Gastrointestinal:  Negative for abdominal pain, constipation and diarrhea.  Musculoskeletal:  Negative for back pain.  Skin:  Negative for rash.  Neurological:  Negative for syncope, facial asymmetry, speech difficulty, weakness and numbness.  Hematological:  Negative for adenopathy. Does not bruise/bleed easily.  Psychiatric/Behavioral:  Negative for behavioral problems, dysphoric mood and suicidal ideas. The patient is not nervous/anxious.     Past Medical History:  Diagnosis Date   Allergy    Anemia    Arthritis    Bleeding gastric ulcer    Complication of anesthesia    CVA (cerebral infarction) 2011   Diverticulosis    GERD (gastroesophageal reflux disease)    prilosec as needed- upset stomach mostly - started after gastric bleeding ulcer   Head concussion    History of blood transfusion 1992   gi bleed   History of kidney stones    Stroke [facility]) 2011     Social History   Socioeconomic History   Marital status: Married    Spouse name: Not on file   Number of children: Not on file   Years of education: Not on file   Highest education level: Bachelor's degree (e.g., BA, AB, BS)  Occupational History   Not on file  Tobacco Use   Smoking status: Never   Smokeless tobacco: Former    Quit date: 1984  Vaping Use   Vaping status: Never Used  Substance and Sexual Activity   Alcohol use: Not Currently   Drug use: No   Sexual activity:  Not Currently  Other Topics Concern   Not on file  Social History Narrative   Not on file   Social Drivers of Health   Financial Resource Strain: Low Risk  (09/22/2023)   Overall Financial Resource Strain (CARDIA)    Difficulty of Paying Living Expenses: Not very hard  Food Insecurity: No Food Insecurity (09/22/2023)   Hunger Vital Sign    Worried About Running Out of Food in the Last Year: Never true    Ran Out of Food in the Last Year: Never true  Transportation Needs: No Transportation Needs  (09/22/2023)   PRAPARE - Administrator, Civil Service (Medical): No    Lack of Transportation (Non-Medical): No  Physical Activity: Insufficiently Active (09/22/2023)   Exercise Vital Sign    Days of Exercise per Week: 1 day    Minutes of Exercise per Session: 20 min  Stress: Stress Concern Present (09/22/2023)   Tyler Obrien    Feeling of Stress : To some extent  Social Connections: Socially Integrated (09/22/2023)   Social Connection and Isolation Panel [NHANES]    Frequency of Communication with Friends and Family: More than three times a week    Frequency of Social Gatherings with Friends and Family: More than three times a week    Attends Religious Services: More than 4 times per year    Active Member of Clubs or Organizations: Yes    Attends Banker Meetings: More than 4 times per year    Marital Status: Married  Catering manager Violence: Not At Risk (07/30/2023)   Humiliation, Afraid, Rape, and Kick Obrien    Fear of Current or Ex-Partner: No    Emotionally Abused: No    Physically Abused: No    Sexually Abused: No    Past Surgical History:  Procedure Laterality Date   APPENDECTOMY     BIOPSY  05/29/2023   Procedure: BIOPSY;  Surgeon: Normie Becton., MD;  Location: [facility];  Service: Gastroenterology;;   COLONOSCOPY     10 yrs ago    cyst removal from scrotum     cyst removed from elbow     CYSTOSCOPY/URETEROSCOPY/HOLMIUM LASER/STENT PLACEMENT Left 11/20/2022   Procedure: LEFT URETEROSCOPY/HOLMIUM LASER/STENT PLACEMENT;  Surgeon: Mallie Seal, MD;  Location: [facility];  Service: Urology;  Laterality: Left;   ESOPHAGOGASTRODUODENOSCOPY N/A 05/29/2023   Procedure: ESOPHAGOGASTRODUODENOSCOPY (EGD);  Surgeon: Normie Becton., MD;  Location: [facility];  Service: Gastroenterology;  Laterality: N/A;   EUS N/A 05/29/2023   Procedure: UPPER ENDOSCOPIC  ULTRASOUND (EUS) RADIAL;  Surgeon: Normie Becton., MD;  Location: [facility];  Service: Gastroenterology;  Laterality: N/A;   FINE NEEDLE ASPIRATION N/A 05/29/2023   Procedure: FINE NEEDLE ASPIRATION (FNA) LINEAR;  Surgeon: Normie Becton., MD;  Location: [facility];  Service: Gastroenterology;  Laterality: N/A;   FINGER FRACTURE SURGERY Right    5th finger   KIDNEY STONE SURGERY     LAPAROSCOPIC GASTRECTOMY N/A 07/30/2023   Procedure: LAPAROSCOPIC PARTIAL GASTRECTOMY;  Surgeon: Lockie Rima, MD;  Location: [facility];  Service: General;  Laterality: N/A;  HARMONIC SCALPEL ECHELON STAPLER   NASAL SINUS SURGERY  08/2009   OPEN ANTERIOR SHOULDER RECONSTRUCTION     UPPER GASTROINTESTINAL ENDOSCOPY     10 yrs ago    WISDOM TOOTH EXTRACTION      Family History  Problem Relation Age of Onset   Cancer Mother    Endometrial cancer Mother  Cancer Father        Lung CA   Hyperlipidemia Father    Hypertension Father    Lung cancer Father    Stomach cancer Maternal Grandfather    Colon cancer Neg Hx    Colon polyps Neg Hx    Esophageal cancer Neg Hx    Rectal cancer Neg Hx     Allergies  Allergen Reactions   Nsaids Other (See Comments)    BLEEDING/ULCER   Lidocaine -Menthol Rash    Only the patches   Prednisone  Palpitations    Current Outpatient Medications on File Prior to Visit  Medication Sig Dispense Refill   albuterol  (VENTOLIN  HFA) 108 (90 Base) MCG/ACT inhaler Inhale 2 puffs into the lungs every 6 (six) hours as needed. 18 g 0   albuterol  (VENTOLIN  HFA) 108 (90 Base) MCG/ACT inhaler Inhale 2 puffs into the lungs every 6 (six) hours as needed for wheezing or shortness of breath. 8 g 0   aspirin EC 81 MG tablet Take 81 mg by mouth daily.     azelastine  (ASTELIN ) 0.1 % nasal spray Place 2 sprays into both nostrils 2 (two) times daily as needed for allergies or rhinitis.     b complex vitamins tablet Take 1 tablet by mouth daily.     cetirizine (ZYRTEC) 10 MG  tablet Take 10 mg by mouth daily as needed for allergies.     Coenzyme Q10 (COQ-10 PO) Take 1 capsule by mouth daily.     Collagen Hydrolysate POWD Take 1 Scoop by mouth daily at 6 (six) AM. Collagen powder     diphenhydrAMINE  (BENADRYL ) 25 mg capsule Take 25-50 mg by mouth at bedtime as needed for sleep or allergies.     fluticasone  (FLONASE ) 50 MCG/ACT nasal spray Place 2 sprays into both nostrils daily. (Patient taking differently: Place 2 sprays into both nostrils daily as needed for allergies.) 16 g 0   gabapentin  (NEURONTIN ) 100 MG capsule Take 100 mg by mouth at bedtime as needed (pain). (Patient not taking: Reported on 09/16/2023)     guaiFENesin (MUCINEX) 600 MG 12 hr tablet Take 600 mg by mouth 2 (two) times daily as needed for cough or to loosen phlegm.     Magnesium 250 MG TABS Take 250 mg by mouth daily.     omeprazole  (PRILOSEC) 40 MG capsule Take 1 capsule (40 mg total) by mouth daily. 90 capsule 3   ondansetron  (ZOFRAN -ODT) 4 MG disintegrating tablet Take 1 tablet (4 mg total) by mouth every 6 (six) hours as needed for nausea. (Patient not taking: Reported on 09/16/2023) 20 tablet 0   No current facility-administered medications on file prior to visit.    BP 136/80   Pulse 78   Resp 18   Ht 5\' 9"  (1.753 m)   Wt 185 lb (83.9 kg)   SpO2 93%   BMI 27.32 kg/m           Objective:   Physical Exam  General Mental Status- Alert. General Appearance- Not in acute distress.   Skin General: Color- Normal Color. Moisture- Normal Moisture.  Neck Carotid Arteries- Normal color. Moisture- Normal Moisture. No carotid bruits. No JVD.  Chest and Lung Exam Auscultation: Breath Sounds:-CTA  Cardiovascular Auscultation:Rythm- RRR Murmurs & Other Heart Sounds:Auscultation of the heart reveals- No Murmurs.  Abdomen Inspection:-Inspeection Normal. Palpation/Percussion:Note:No mass. Palpation and Percussion of the abdomen reveal- Non Tender, Non Distended + BS, no rebound or  guarding.   Neurologic Cranial Nerve exam:- CN III-XII intact(No nystagmus),  symmetric smile. actStrength:- 5/5 equal and symmetric strength both upper and lower extremities.       Assessment & Plan:   Patient Instructions  Tachycardia Intermittent tachycardia with previous Holter showing ventricular and supraventricular episodes. Current EKG NSR. Possible cardiac arrhythmia exacerbated by activity. - Refer to cardiologist Dr. Krazowski for evaluation and potential Zio patch monitoring. - Order CBC and thyroid function tests (T4, TSH) to assess for anemia and rule out thyroid-related causes.  Fatigue Persistent fatigue, previously linked to anemia. Evaluating for thyroid dysfunction and B12 deficiency. - Order CBC, B12, and thyroid function tests (T4, TSH).  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 Differential includes age-related decline or neurological conditions. - Refer to neurologist for evaluation. - Order B12 level to assess for deficiency affecting cognition.  Follow-up Follow-up contingent on lab results and specialist evaluations. - Determine follow-up based on lab results and specialist recommendations.   Tayvian Holycross, PA-C     Time spent with patient today was 45  minutes which consisted of chart revdiew, discussing diagnosis, work up treatment and documentation.(Total time did not include EKG )

## 2024-03-09 NOTE — Patient Instructions (Signed)
 Tachycardia Intermittent tachycardia with previous Holter showing ventricular and supraventricular episodes. Current EKG NSR. Possible cardiac arrhythmia exacerbated by activity. - Refer to cardiologist Dr. Krazowski for evaluation and potential Zio patch monitoring. - Order CBC and thyroid function tests (T4, TSH) to assess for anemia and rule out thyroid-related causes.  Fatigue Persistent fatigue, previously linked to anemia. Evaluating for thyroid dysfunction and B12 deficiency. - Order CBC, B12, and thyroid function tests (T4, TSH).  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 Differential includes age-related decline or neurological conditions. - Refer to neurologist for evaluation. - Order B12 level to assess for deficiency affecting cognition.  Follow-up Follow-up contingent on lab results and specialist evaluations. - Determine follow-up based on lab results and specialist recommendations.

## 2024-03-10 ENCOUNTER — Encounter: Payer: Self-pay | Admitting: Medical

## 2024-03-10 ENCOUNTER — Encounter: Payer: Self-pay | Admitting: Physician Assistant

## 2024-03-13 LAB — IRON,TIBC AND FERRITIN PANEL
%SAT: 53 % — ABNORMAL HIGH (ref 20–48)
Ferritin: 159 ng/mL (ref 24–380)
Iron: 135 ug/dL (ref 50–180)
TIBC: 256 ug/dL (ref 250–425)

## 2024-03-13 LAB — VITAMIN B1: Vitamin B1 (Thiamine): 8 nmol/L (ref 8–30)

## 2024-03-15 NOTE — Addendum Note (Signed)
 Addended by: Serafina Damme on: 03/15/2024 08:13 PM   Modules accepted: Orders

## 2024-03-28 NOTE — Progress Notes (Signed)
 Assessment/Plan:   Tyler Obrien is a very pleasant 63 y.o. year old RH male with a history of hypertension, hyperlipidemia, iron  deficiency anemia, learning difficulty, DDD, CAD with minimal disease seen today for evaluation of memory loss. MoCA today is /30.***.  Workup is in progress.  Patient is able to participate on ADLs and continues to drive without significant difficulties.***    Memory Impairment of unclear etiology  MRI brain without contrast to assess for underlying structural abnormality and assess vascular load  Neurocognitive testing to further evaluate cognitive concerns and determine other underlying cause of memory changes, including potential contribution from sleep, anxiety, attention, or depression  Continue to control mood as per PCP Recommend good control of cardiovascular risk factors, follow-up with cardiology Replenish B1, follow-up with PCP [place] up in 1-2  months***  Subjective:   The patient is accompanied by ***  who supplement  the history.   How long did patient have memory difficulties? For the last ***.  Reports some difficulty remembering new information, conversations and names.  Long-term memory is good. repeats oneself?  Endorsed Disoriented when walking into a room?  Patient denies except occasionally not remembering what patient came to the room for ***  Leaving objects in unusual places? Denies.   Wandering behavior?  denies .  Any personality changes?  Denies.   Any history of depression?:  Denies   Hallucinations or paranoia?  Denies   Seizures?  Denies    Any sleep changes?   Sleeps well***does not sleep well***denies vivid dreams, REM behavior or sleepwalking   Sleep apnea?  Denies   Any hygiene concerns?  Denies   Independent of bathing and dressing?  Endorsed  Does the patient needs help with medications? Patient is in charge *** Who is in charge of the finances? Patient is in charge   *** Any changes in appetite?  Denies ***    Patient have trouble swallowing? Denies.   Does the patient cook? No ***  Any kitchen accidents such as leaving the stove on? Denies.   Any history of headaches?   Denies.   Chronic pain ? Denies.   Ambulates with difficulty?  Denies. *** Recent falls or head injuries? Denies.   Vision changes? Denies.   Any stroke like symptoms? Denies.   Any tremors?   Denies.   Any anosmia?  Denies.   Any incontinence of urine? Denies.   Any bowel dysfunction? Denies.      Patient lives with  *** History of heavy alcohol intake? Denies.   History of heavy tobacco use? Denies.   Family history of dementia? Denies.  Does patient drive? Yes *** Recent labs June 2025: B1 8, TSH 1.2 with, T4 of 0.94, B12 483, percentage saturation of iron  53, with HH 16.4-48.5, MCV 90.1, TC 294, LDL 211, TG 178  Past Medical History:  Diagnosis Date   Allergy    Anemia    Arthritis    Bleeding gastric ulcer    Complication of anesthesia    CVA (cerebral infarction) 2011   Diverticulosis    GERD (gastroesophageal reflux disease)    prilosec as needed- upset stomach mostly - started after gastric bleeding ulcer   Head concussion    History of blood transfusion 1992   gi bleed   History of kidney stones    Stroke [facility]) 2011     Past Surgical History:  Procedure Laterality Date   APPENDECTOMY     BIOPSY  05/29/2023  Procedure: BIOPSY;  Surgeon: [name], MD;  Location: [facility];  Service: Gastroenterology;;   COLONOSCOPY     10 yrs ago    cyst removal from scrotum     cyst removed from elbow     CYSTOSCOPY/URETEROSCOPY/HOLMIUM LASER/STENT PLACEMENT Left 11/20/2022   Procedure: LEFT URETEROSCOPY/HOLMIUM LASER/STENT PLACEMENT;  Surgeon: [name], MD;  Location: [facility];  Service: Urology;  Laterality: Left;   ESOPHAGOGASTRODUODENOSCOPY N/A 05/29/2023   Procedure: ESOPHAGOGASTRODUODENOSCOPY (EGD);  Surgeon: [name], MD;  Location: [facility];  Service:  Gastroenterology;  Laterality: N/A;   EUS N/A 05/29/2023   Procedure: UPPER ENDOSCOPIC ULTRASOUND (EUS) RADIAL;  Surgeon: [name], MD;  Location: [facility];  Service: Gastroenterology;  Laterality: N/A;   FINE NEEDLE ASPIRATION N/A 05/29/2023   Procedure: FINE NEEDLE ASPIRATION (FNA) LINEAR;  Surgeon: [name], MD;  Location: [facility];  Service: Gastroenterology;  Laterality: N/A;   FINGER FRACTURE SURGERY Right    5th finger   KIDNEY STONE SURGERY     LAPAROSCOPIC GASTRECTOMY N/A 07/30/2023   Procedure: LAPAROSCOPIC PARTIAL GASTRECTOMY;  Surgeon: [name], MD;  Location: [facility];  Service: General;  Laterality: N/A;  HARMONIC SCALPEL ECHELON STAPLER   NASAL SINUS SURGERY  08/2009   OPEN ANTERIOR SHOULDER RECONSTRUCTION     UPPER GASTROINTESTINAL ENDOSCOPY     10 yrs ago    WISDOM TOOTH EXTRACTION       Allergies  Allergen Reactions   Nsaids Other (See Comments)    BLEEDING/ULCER   Lidocaine -Menthol Rash    Only the patches   Prednisone  Palpitations    Current Outpatient Medications  Medication Instructions   albuterol  (VENTOLIN  HFA) 108 (90 Base) MCG/ACT inhaler 2 puffs, Inhalation, Every 6 hours PRN   albuterol  (VENTOLIN  HFA) 108 (90 Base) MCG/ACT inhaler 2 puffs, Inhalation, Every 6 hours PRN   aspirin EC 81 mg, Oral, Daily   azelastine  (ASTELIN ) 0.1 % nasal spray 2 sprays, Each Nare, 2 times daily PRN   b complex vitamins tablet 1 tablet, Oral, Daily   cetirizine (ZYRTEC) 10 mg, Oral, Daily PRN   Coenzyme Q10 (COQ-10 PO) 1 capsule, Oral, Daily   Collagen Hydrolysate POWD 1 Scoop, Oral, Daily, Collagen powder   diphenhydrAMINE  (BENADRYL ) 25-50 mg, Oral, At bedtime PRN   fexofenadine  (ALLEGRA  ALLERGY) 60 mg, Oral, 2 times daily   fluticasone  (FLONASE ) 50 MCG/ACT nasal spray 2 sprays, Each Nare, Daily   gabapentin  (NEURONTIN ) 100 mg, At bedtime PRN   guaiFENesin (MUCINEX) 600 mg, Oral, 2 times daily PRN   Magnesium 250 mg, Oral, Daily    omeprazole  (PRILOSEC) 40 mg, Oral, Daily   ondansetron  (ZOFRAN -ODT) 4 mg, Oral, Every 6 hours PRN     VITALS:  There were no vitals filed for this visit.        No data to display              No data to display           PHYSICAL EXAM   HEENT:  Normocephalic, atraumatic. The superficial temporal arteries are without ropiness or tenderness. Cardiovascular: Regular rate and rhythm. Lungs: Clear to auscultation bilaterally. Neck: There are no carotid bruits noted bilaterally.  Orientation:  Alert and oriented to person, place and time. No aphasia or dysarthria. Fund of knowledge is appropriate. Recent memory impaired and remote memory intact.  Attention and concentration are normal.  Able to name objects and repeat phrases. Delayed recall  /5 Cranial nerves: There is  good facial symmetry. Extraocular muscles are intact and visual fields are full to confrontational testing. Speech is fluent and clear. No tongue deviation. Hearing is intact to conversational tone. Tone: Tone is good throughout. Abnormal movements: No tremors. No Asterixis. No Fasciculations Sensation: Sensation is intact to light touch. Vibration is intact at the bilateral big toe.  Coordination: The patient has no difficulty with RAM's or FNF bilaterally. Normal finger to nose  Motor: Strength is 5/5 in the bilateral upper and lower extremities. There is no pronator drift. There are no fasciculations noted. DTR's: Deep tendon reflexes are 2/4 bilaterally. Gait and Station: The patient is able to ambulate without difficulty The patient is able to heel toe walk. Gait is cautious and narrow. The patient is able to ambulate in a tandem fashion.       Thank you for allowing us  the opportunity to participate in the care of this nice patient. Please do not hesitate to contact us  for any questions or concerns.   Total time spent on today's visit was *** minutes dedicated to this patient today, preparing to see  patient, examining the patient, ordering tests and/or medications and counseling the patient, documenting clinical information in the EHR or other health record, independently interpreting results and communicating results to the patient/family, discussing treatment and goals, answering patient's questions and coordinating care.  Cc:  Dorina Dallas DEVONNA [facility] 03/28/2024 11:12 AM

## 2024-04-01 ENCOUNTER — Encounter: Payer: Self-pay | Admitting: Physician Assistant

## 2024-04-01 ENCOUNTER — Ambulatory Visit (INDEPENDENT_AMBULATORY_CARE_PROVIDER_SITE_OTHER): Admitting: Physician Assistant

## 2024-04-01 ENCOUNTER — Ambulatory Visit

## 2024-04-01 ENCOUNTER — Encounter: Payer: Self-pay | Admitting: Family

## 2024-04-01 VITALS — BP 130/88 | HR 79 | Resp 20 | Ht 69.0 in | Wt 190.0 lb

## 2024-04-01 DIAGNOSIS — R413 Other amnesia: Secondary | ICD-10-CM

## 2024-04-01 NOTE — Patient Instructions (Addendum)
 It was a pleasure to see you today at our office.   Recommendations:  Neurocognitive evaluation at our office   MRI of the brain, the radiology office will call you to arrange you appointment  [phone] Follow up after neuropsych evaluation      [email or web address]   RECOMMENDATIONS FOR ALL PATIENTS WITH MEMORY PROBLEMS: 1. Continue to exercise (Recommend 30 minutes of walking everyday, or 3 hours every week) 2. Increase social interactions - continue going to [place] and enjoy social gatherings with friends and family 3. Eat healthy, avoid fried foods and eat more fruits and vegetables 4. Maintain adequate blood pressure, blood sugar, and blood cholesterol level. Reducing the risk of stroke and cardiovascular disease also helps promoting better memory. 5. Avoid stressful situations. Live a simple life and avoid aggravations. Organize your time and prepare for the next day in anticipation. 6. Sleep well, avoid any interruptions of sleep and avoid any distractions in the bedroom that may interfere with adequate sleep quality 7. Avoid sugar, avoid sweets as there is a strong link between excessive sugar intake, diabetes, and cognitive impairment We discussed the Mediterranean diet, which has been shown to help patients reduce the risk of progressive memory disorders and reduces cardiovascular risk. This includes eating fish, eat fruits and green leafy vegetables, nuts like almonds and hazelnuts, walnuts, and also use olive oil. Avoid fast foods and fried foods as much as possible. Avoid sweets and sugar as sugar use has been linked to worsening of memory function.  There is always a concern of gradual progression of memory problems. If this is the case, then we may need to adjust level of care according to patient needs. Support, both to the patient and caregiver, should then be put into place.      You have been referred for a  neuropsychological evaluation (i.e., evaluation of memory and thinking abilities). Please bring someone with you to this appointment if possible, as it is helpful for the doctor to hear from both you and another adult who knows you well. Please bring eyeglasses and hearing aids if you wear them.    The evaluation will take approximately 3 hours and has two parts:   The first part is a clinical interview with the neuropsychologist (Dr. Richie or Dr. Gayland). During the interview, the neuropsychologist will speak with you and the individual you brought to the appointment.    The second part of the evaluation is testing with the doctor's technician Neal or Luke). During the testing, the technician will ask you to remember different types of material, solve problems, and answer some questionnaires. Your family member will not be present for this portion of the evaluation.   Please note: We must reserve several hours of the neuropsychologist's time and the psychometrician's time for your evaluation appointment. As such, there is a No-Show fee of $100. If you are unable to attend any of your appointments, please contact our office as soon as possible to reschedule.      DRIVING: Regarding driving, in patients with progressive memory problems, driving will be impaired. We advise to have someone else do the driving if trouble finding directions or if minor accidents are reported. Independent driving assessment is available to determine safety of driving.   If you are interested in the driving assessment, you can contact the following:  [facility] [employer] [facility] in [place] [phone]  [facility] [phone]  [facility] [phone]  [facility] [phone] or [phone]   FALL PRECAUTIONS:  Be cautious when walking. Scan the area for obstacles that may increase the risk of trips and falls. When getting up in the mornings, sit up at the edge of the bed for a  few minutes before getting out of bed. Consider elevating the bed at the head end to avoid drop of blood pressure when getting up. Walk always in a well-lit room (use night lights in the walls). Avoid area rugs or power cords from appliances in the middle of the walkways. Use a walker or a cane if necessary and consider physical therapy for balance exercise. Get your eyesight checked regularly.  FINANCIAL OVERSIGHT: Supervision, especially oversight when making financial decisions or transactions is also recommended.  HOME SAFETY: Consider the safety of the kitchen when operating appliances like stoves, microwave oven, and blender. Consider having supervision and share cooking responsibilities until no longer able to participate in those. Accidents with firearms and other hazards in the house should be identified and addressed as well.   ABILITY TO BE LEFT ALONE: If patient is unable to contact 911 operator, consider using LifeLine, or when the need is there, arrange for someone to stay with patients. Smoking is a fire hazard, consider supervision or cessation. Risk of wandering should be assessed by caregiver and if detected at any point, supervision and safe proof recommendations should be instituted.  MEDICATION SUPERVISION: Inability to self-administer medication needs to be constantly addressed. Implement a mechanism to ensure safe administration of the medications.      Mediterranean Diet A Mediterranean diet refers to food and lifestyle choices that are based on the traditions of countries located on the [employer]. This way of eating has been shown to help prevent certain conditions and improve outcomes for people who have chronic diseases, like kidney disease and heart disease. What are tips for following this plan? Lifestyle  Cook and eat meals together with your family, when possible. Drink enough fluid to keep your urine clear or pale yellow. Be physically active every day.  This includes: Aerobic exercise like running or swimming. Leisure activities like gardening, walking, or housework. Get 7-8 hours of sleep each night. If recommended by your health care provider, drink red wine in moderation. This means 1 glass a day for nonpregnant women and 2 glasses a day for men. A glass of wine equals 5 oz (150 mL). Reading food labels  Check the serving size of packaged foods. For foods such as rice and pasta, the serving size refers to the amount of cooked product, not dry. Check the total fat in packaged foods. Avoid foods that have saturated fat or trans fats. Check the ingredients list for added sugars, such as corn syrup. Shopping  At the grocery store, buy most of your food from the areas near the walls of the store. This includes: Fresh fruits and vegetables (produce). Grains, beans, nuts, and seeds. Some of these may be available in unpackaged forms or large amounts (in bulk). Fresh seafood. Poultry and eggs. Low-fat dairy products. Buy whole ingredients instead of prepackaged foods. Buy fresh fruits and vegetables in-season from local farmers markets. Buy frozen fruits and vegetables in resealable bags. If you do not have access to quality fresh seafood, buy precooked frozen shrimp or canned fish, such as tuna, salmon, or sardines. Buy small amounts of raw or cooked vegetables, salads, or olives from the deli or salad bar at your store. Stock your pantry so you always have certain foods on hand, such as olive oil, canned tuna,  canned tomatoes, rice, pasta, and beans. Cooking  Cook foods with extra-virgin olive oil instead of using butter or other vegetable oils. Have meat as a side dish, and have vegetables or grains as your main dish. This means having meat in small portions or adding small amounts of meat to foods like pasta or stew. Use beans or vegetables instead of meat in common dishes like chili or lasagna. Experiment with different cooking methods.  Try roasting or broiling vegetables instead of steaming or sauteing them. Add frozen vegetables to soups, stews, pasta, or rice. Add nuts or seeds for added healthy fat at each meal. You can add these to yogurt, salads, or vegetable dishes. Marinate fish or vegetables using olive oil, lemon juice, garlic, and fresh herbs. Meal planning  Plan to eat 1 vegetarian meal one day each week. Try to work up to 2 vegetarian meals, if possible. Eat seafood 2 or more times a week. Have healthy snacks readily available, such as: Vegetable sticks with hummus. Greek yogurt. Fruit and nut trail mix. Eat balanced meals throughout the week. This includes: Fruit: 2-3 servings a day Vegetables: 4-5 servings a day Low-fat dairy: 2 servings a day Fish, poultry, or lean meat: 1 serving a day Beans and legumes: 2 or more servings a week Nuts and seeds: 1-2 servings a day Whole grains: 6-8 servings a day Extra-virgin olive oil: 3-4 servings a day Limit red meat and sweets to only a few servings a month What are my food choices? Mediterranean diet Recommended Grains: Whole-grain pasta. Brown rice. Bulgar wheat. Polenta. Couscous. Whole-wheat bread. [place] Madeira. Vegetables: Artichokes. Beets. Broccoli. Cabbage. Carrots. Eggplant. Green beans. Chard. Kale. Spinach. Onions. Leeks. Peas. Squash. Tomatoes. Peppers. Radishes. Fruits: Apples. Apricots. Avocado. Berries. Bananas. Cherries. Dates. Figs. Grapes. Lemons. Melon. Oranges. Peaches. Plums. Pomegranate. Meats and other protein foods: Beans. Almonds. Sunflower seeds. Pine nuts. Peanuts. Cod. Salmon. Scallops. Shrimp. Tuna. Tilapia. Clams. Oysters. Eggs. Dairy: Low-fat milk. Cheese. Greek yogurt. Beverages: Water. Red wine. Herbal tea. Fats and oils: Extra virgin olive oil. Avocado oil. Grape seed oil. Sweets and desserts: [place] yogurt with honey. Baked apples. Poached pears. Trail mix. Seasoning and other foods: Basil. Cilantro. Coriander. Cumin. Mint.  Parsley. Sage. Rosemary. Tarragon. Garlic. Oregano. Thyme. Pepper. Balsalmic vinegar. Tahini. Hummus. Tomato sauce. Olives. Mushrooms. Limit these Grains: Prepackaged pasta or rice dishes. Prepackaged cereal with added sugar. Vegetables: Deep fried potatoes (french fries). Fruits: Fruit canned in syrup. Meats and other protein foods: Beef. Pork. Lamb. Poultry with skin. Hot dogs. [place]. Dairy: Ice cream. Sour cream. Whole milk. Beverages: Juice. Sugar-sweetened soft drinks. Beer. Liquor and spirits. Fats and oils: Butter. Canola oil. Vegetable oil. Beef fat (tallow). Lard. Sweets and desserts: Cookies. Cakes. Pies. Candy. Seasoning and other foods: Mayonnaise. Premade sauces and marinades. The items listed may not be a complete list. Talk with your dietitian about what dietary choices are right for you. Summary The Mediterranean diet includes both food and lifestyle choices. Eat a variety of fresh fruits and vegetables, beans, nuts, seeds, and whole grains. Limit the amount of red meat and sweets that you eat. Talk with your health care provider about whether it is safe for you to drink red wine in moderation. This means 1 glass a day for nonpregnant women and 2 glasses a day for men. A glass of wine equals 5 oz (150 mL). This information is not intended to replace advice given to you by your health care provider. Make sure you discuss any questions you have with  your health care provider. Document Released: 05/16/2016 Document Revised: 06/18/2016 Document Reviewed: 05/16/2016 Elsevier Interactive Patient Education  2017 [employer].

## 2024-04-16 ENCOUNTER — Encounter: Payer: Self-pay | Admitting: Family

## 2024-04-17 ENCOUNTER — Encounter: Payer: Self-pay | Admitting: Medical

## 2024-04-21 ENCOUNTER — Encounter: Payer: Self-pay | Admitting: Cardiology

## 2024-04-22 ENCOUNTER — Ambulatory Visit: Admitting: Cardiology

## 2024-04-30 ENCOUNTER — Ambulatory Visit
Admission: RE | Admit: 2024-04-30 | Discharge: 2024-04-30 | Disposition: A | Discharge status: Home / Self Care | Source: Ambulatory Visit | Attending: Physician Assistant | Admitting: Physician Assistant

## 2024-04-30 MED ORDER — GADOPICLENOL 0.5 MMOL/ML IV SOLN
8.0000 mL | Freq: Once | INTRAVENOUS | Status: AC | PRN
  Administered 2024-04-30: 8 mL via INTRAVENOUS

## 2024-05-03 ENCOUNTER — Ambulatory Visit: Payer: Self-pay | Admitting: Physician Assistant

## 2024-05-05 ENCOUNTER — Encounter: Payer: Self-pay | Admitting: Family

## 2024-05-05 ENCOUNTER — Ambulatory Visit (INDEPENDENT_AMBULATORY_CARE_PROVIDER_SITE_OTHER): Admitting: Physician Assistant

## 2024-05-05 ENCOUNTER — Ambulatory Visit (INDEPENDENT_AMBULATORY_CARE_PROVIDER_SITE_OTHER): Admitting: Audiology

## 2024-05-05 VITALS — BP 130/88 | HR 80

## 2024-05-05 DIAGNOSIS — H9319 Tinnitus, unspecified ear: Secondary | ICD-10-CM | POA: Diagnosis not present

## 2024-05-05 DIAGNOSIS — H905 Unspecified sensorineural hearing loss: Secondary | ICD-10-CM

## 2024-05-05 DIAGNOSIS — J302 Other seasonal allergic rhinitis: Secondary | ICD-10-CM

## 2024-05-05 DIAGNOSIS — H903 Sensorineural hearing loss, bilateral: Secondary | ICD-10-CM | POA: Diagnosis not present

## 2024-05-05 NOTE — Progress Notes (Signed)
 Dear Dr. Dorina, Here is my assessment for our mutual patient, Tyler Obrien. Thank you for allowing me the opportunity to care for your patient. Please do not hesitate to contact me should you have any other questions. Sincerely, [name]  Otolaryngology Clinic Note Referring provider: Dr. Dorina HPI:  Tyler Obrien is a 63 y.o. male kindly referred by Dr. Dorina   The patient is a 63 year old gentleman seen in our office for evaluation of allergic rhinitis and tinnitus.  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  He notes this is not significantly bothersome, he notes it is worse in quiet environments.  He denies any associated pain or dizziness.  He also notes a significant history of 20 years of sinus related disease.  He notes that he did have sinus surgery in 2009 reporting that they fixed the symptom and did some manipulation of the sinuses.  He notes it did help for a while.  He notes his last year is his first year without a upper respiratory infection.  He notes persistent nasal congestion, left greater than right.  He has used Flonase  in the past but notes that this caused agitation and significant dryness so he discontinued using it.  Currently is only treatment is azelastine  which does not provide significant relief.  He previously saw an allergist, he was told that he should begin allergy shots but had numerous medical conditions going on at that time and did not pursue it.   Independent Review of Additional Tests or Records:  Audiological evaluation 05/05/2024  Otoscopy: Right ear: Clear external ear canal and notable landmarks visualized on the tympanic membrane. Left ear:  Clear external ear canal and notable landmarks visualized on the tympanic membrane.   Tympanometry: Right ear: Type A- Normal external ear canal volume with normal middle ear pressure and tympanic membrane compliance. Left ear: Type A- Normal  external ear canal volume with normal middle ear pressure and tympanic membrane compliance.     Pure tone Audiometry: Right ear- Normal hearing from [phone], then mild to moderate sensorineural hearing loss from 2000 Hz - 8000 Hz. Left ear- Normal hearing from [phone] Hz, then mild to moderate sensorineural hearing loss from 2000 Hz - 8000 Hz.   Speech Audiometry: Right ear- Speech Reception Threshold (SRT) was obtained at 20 dBHL. Left ear-Speech Reception Threshold (SRT) was obtained at 15 dBHL.   Word Recognition Score Tested using NU-6 (recorded) Right ear: 100% was obtained at a presentation level of 60 dBHL with contralateral masking which is deemed as  excellent. Left ear: 100% was obtained at a presentation level of 60 dBHL with contralateral masking which is deemed as  excellent.   The hearing test results were completed under headphones and results are deemed to be of good reliability. Test technique:  conventional         PMH/Meds/All/SocHx/FamHx/ROS:   Past Medical History:  Diagnosis Date   Allergy    Anemia    Arthritis    Bleeding gastric ulcer    Complication of anesthesia    CVA (cerebral infarction) 2011   Diverticulosis    GERD (gastroesophageal reflux disease)    prilosec as needed- upset stomach mostly - started after gastric bleeding ulcer   Head concussion    History of blood transfusion 1992   gi bleed   History of kidney stones    Stroke [facility]) 2011     Past Surgical History:  Procedure Laterality Date   APPENDECTOMY     BIOPSY  05/29/2023   Procedure: BIOPSY;  Surgeon: [name], MD;  Location: [facility];  Service: Gastroenterology;;   COLONOSCOPY     10 yrs ago    cyst removal from scrotum     cyst removed from elbow     CYSTOSCOPY/URETEROSCOPY/HOLMIUM LASER/STENT PLACEMENT Left 11/20/2022   Procedure: LEFT URETEROSCOPY/HOLMIUM LASER/STENT PLACEMENT;  Surgeon: [name], MD;  Location: [facility];  Service: Urology;   Laterality: Left;   ESOPHAGOGASTRODUODENOSCOPY N/A 05/29/2023   Procedure: ESOPHAGOGASTRODUODENOSCOPY (EGD);  Surgeon: [name], MD;  Location: [facility];  Service: Gastroenterology;  Laterality: N/A;   EUS N/A 05/29/2023   Procedure: UPPER ENDOSCOPIC ULTRASOUND (EUS) RADIAL;  Surgeon: [name], MD;  Location: [facility];  Service: Gastroenterology;  Laterality: N/A;   FINE NEEDLE ASPIRATION N/A 05/29/2023   Procedure: FINE NEEDLE ASPIRATION (FNA) LINEAR;  Surgeon: [name], MD;  Location: [facility];  Service: Gastroenterology;  Laterality: N/A;   FINGER FRACTURE SURGERY Right    5th finger   KIDNEY STONE SURGERY     LAPAROSCOPIC GASTRECTOMY N/A 07/30/2023   Procedure: LAPAROSCOPIC PARTIAL GASTRECTOMY;  Surgeon: [name], MD;  Location: [facility];  Service: General;  Laterality: N/A;  HARMONIC SCALPEL ECHELON STAPLER   NASAL SINUS SURGERY  08/2009   OPEN ANTERIOR SHOULDER RECONSTRUCTION     UPPER GASTROINTESTINAL ENDOSCOPY     10 yrs ago    WISDOM TOOTH EXTRACTION      Family History  Problem Relation Age of Onset   Cancer Mother    Endometrial cancer Mother    Cancer Father        Lung CA   Hyperlipidemia Father    Hypertension Father    Lung cancer Father    Stomach cancer Maternal Grandfather    Colon cancer Neg Hx    Colon polyps Neg Hx    Esophageal cancer Neg Hx    Rectal cancer Neg Hx      Social Connections: Socially Integrated (09/22/2023)   Social Connection and Isolation Panel    Frequency of Communication with Friends and Family: Obrien than three times a week    Frequency of Social Gatherings with Friends and Family: Obrien than three times a week    Attends Religious Services: Obrien than 4 times per year    Active Member of [employer] or Organizations: Yes    Attends Engineer, structural: Obrien than 4 times per year    Marital Status: Married      Current Outpatient Medications:    albuterol  (VENTOLIN  HFA)  108 (90 Base) MCG/ACT inhaler, Inhale 2 puffs into the lungs every 6 (six) hours as needed., Disp: 18 g, Rfl: 0   aspirin EC 81 MG tablet, Take 81 mg by mouth daily., Disp: , Rfl:    azelastine  (ASTELIN ) 0.1 % nasal spray, Place 2 sprays into both nostrils 2 (two) times daily as needed for allergies or rhinitis., Disp: , Rfl:    b complex vitamins tablet, Take 1 tablet by mouth daily., Disp: , Rfl:    Coenzyme Q10 (COQ-10 PO), Take 1 capsule by mouth daily., Disp: , Rfl:    Collagen Hydrolysate POWD, Take 1 Scoop by mouth daily at 6 (six) AM. Collagen powder, Disp: , Rfl:    diphenhydrAMINE  (BENADRYL ) 25 mg capsule, Take 25-50 mg by mouth at bedtime as needed for sleep or allergies., Disp: , Rfl:    guaiFENesin (MUCINEX) 600  MG 12 hr tablet, Take 600 mg by mouth 2 (two) times daily as needed for cough or to loosen phlegm. (Patient not taking: Reported on 04/01/2024), Disp: , Rfl:    Magnesium 250 MG TABS, Take 250 mg by mouth daily., Disp: , Rfl:    omeprazole  (PRILOSEC) 40 MG capsule, Take 1 capsule (40 mg total) by mouth daily., Disp: 90 capsule, Rfl: 3   Physical Exam:   BP 130/88   Pulse 80   SpO2 92%   Pertinent Findings  CN II-XII intact Bilateral EAC clear and TM intact with well pneumatized middle ear spaces Weber 512: equal Rinne 512: AC > BC b/l  Anterior rhinoscopy: Septum midline; bilateral inferior turbinates with moderate right sided hypertrophy, left severe No lesions of oral cavity/oropharynx; dentition normal limits No obviously palpable neck masses/lymphadenopathy/thyromegaly No respiratory distress or stridor   Seprately Identifiable Procedures:  None  Impression & Plans:  Tyler Obrien is a 63 y.o. male with the following   Allergic rhinitis-  Patient with longstanding history of uncontrolled allergic rhinitis.  He is unable to tolerate Flonase  and antihistamines.  He will attempt using Allegra  to see if this is too drying.  I would recommend he follow-up with  allergist for discussion of further treatments.  If after treatment he has persistent symptoms and would like to discuss any surgical options I encouraged him to return to our office.  Tonal tinnitus-  Minimal symptoms, no red flags, reassuring audiological evaluation with symmetric sensorineural hearing loss predominantly from 1500 Hz through 8000.  No immediate intervention warranted, follow-up in our office with any changes.   - f/u PRN   Thank you for allowing me the opportunity to care for your patient. Please do not hesitate to contact me should you have any other questions.  Sincerely, [name] [facility] Phone: [phone] Fax: [phone]  05/05/2024, 9:58 AM
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
## 2024-05-12 ENCOUNTER — Encounter: Payer: Self-pay | Admitting: Audiology

## 2024-05-13 ENCOUNTER — Ambulatory Visit (INDEPENDENT_AMBULATORY_CARE_PROVIDER_SITE_OTHER): Admitting: Psychology

## 2024-05-13 ENCOUNTER — Encounter: Payer: Self-pay | Admitting: Family

## 2024-05-13 ENCOUNTER — Ambulatory Visit

## 2024-05-13 DIAGNOSIS — R419 Unspecified symptoms and signs involving cognitive functions and awareness: Secondary | ICD-10-CM

## 2024-05-13 DIAGNOSIS — R4189 Other symptoms and signs involving cognitive functions and awareness: Secondary | ICD-10-CM

## 2024-05-13 NOTE — Progress Notes (Signed)
   Psychometrician Note   Cognitive testing was administered to [name] by Luke Pitcher, B.S. (psychometrist) under the supervision of Dr. Renda Beckwith, Psy.D., licensed psychologist on 05/13/2024. Tyler Obrien did not appear overtly distressed by the testing session per behavioral observation or responses across self-report questionnaires. Rest breaks were offered.    The battery of tests administered was selected by Dr. Renda Beckwith, Psy.D. with consideration to Tyler Obrien current level of functioning, the nature of his symptoms, emotional and behavioral responses during interview, level of literacy, observed level of motivation/effort, and the nature of the referral question. This battery was communicated to the psychometrist. Communication between Dr. Renda Beckwith, Psy.D. and the psychometrist was ongoing throughout the evaluation and Dr. Renda Beckwith, Psy.D. was immediately accessible at all times. Dr. Renda Beckwith, Psy.D. provided supervision to the psychometrist on the date of this service to the extent necessary to assure the quality of all services provided.    Tyler Obrien will return within approximately 1-2 weeks for an interactive feedback session with Dr. Beckwith at which time his test performances, clinical impressions, and treatment recommendations will be reviewed in detail. Tyler Obrien understands he can contact our office should he require our assistance before this time.  A total of 100 minutes of billable time were spent face-to-face with Tyler Obrien by the psychometrist. This includes both test administration and scoring time. Billing for these services is reflected in the clinical report generated by Dr. Renda Beckwith, Psy.D.  This note reflects time spent with the psychometrician and does not include test scores or any clinical interpretations made by Dr. Beckwith. The full report will follow in a separate note.

## 2024-05-14 ENCOUNTER — Institutional Professional Consult (permissible substitution): Admitting: Psychology

## 2024-05-14 ENCOUNTER — Ambulatory Visit: Payer: Self-pay

## 2024-05-14 NOTE — Progress Notes (Signed)
 NEUROPSYCHOLOGICAL EVALUATION . [facility]  [place] Department of Neurology  Date of Evaluation: 05/13/2024  REASON FOR REFERRAL   Tyler Obrien is a 63 year old, right-handed, White male with 16 years of formal education. He was referred for neuropsychological evaluation by his neurologist, Camie Sevin, PA-C, to assess current neurocognitive functioning, document potential cognitive deficits, and assist with treatment planning. This is his first neuropsychological evaluation.  SUMMARY OF RESULTS   Premorbid cognitive abilities are estimated to be in the high average range based on word reading and sociodemographic factors. Consistent with this baseline estimate, performance today was intact across all domains, including attention/working memory, processing speed, executive functioning, language, visuospatial abilities, learning/memory, and fine motor dexterity. Scores in most domains were not only within the expected range compared to a normative sample of his peers but often surpassed expectations. The only exception was a low delayed recall score on the word list task; however, this is likely not clinically significant, as his recognition was near perfect and all other memory performances were adequate.  On self-report questionnaires, he did not endorse clinically significant symptoms of depression or anxiety.  DIAGNOSTIC IMPRESSION   Results of the current evaluation indicated normal cognitive functioning, with several scores notably exceeding expectations. He does not show evidence of a neurocognitive disorder at this time. His overall profile reflects healthy cognitive aging and well-preserved functional abilities. Subjective cognitive concerns are likely multifactorial, related to normal aging, several recent health stressors, persistent fatigue, and regular Benadryl  use until recently. Improvement in modifiable factors may lead to a continued reduction in perceived  cognitive difficulties. Most encouragingly, he is managing well at work and is interested in advancing to more challenging roles.  Results provide a baseline for future comparison should reevaluation become necessary.  ICD-10 Codes: R41.9 Cognitive concerns with normal neuropsychological exam  RECOMMENDATIONS   In consultation with your doctor, schedule cognitive reevaluation on an as-needed basis to assess for cognitive decline and update treatment recommendations. Reevaluation should occur during a period of medical and affective stability.  Prioritize physical health through diet, exercise, and sleep.  Exercise: Regular physical activity supports cardiovascular health, improves mood, and helps preserve mobility and independence. Aim for at least 150 minutes of moderate aerobic exercise per week (e.g., brisk walking, swimming, gardening). However, given your ongoing fatigue, it's important to start slowly and gradually increase activity as tolerated. Even light activity or short walks can be beneficial and help boost your energy over time.  Diet: A brain-healthy diet such as the Mediterranean or MIND diet is rich in fruits, vegetables, whole grains, healthy fats, and lean proteins. This type of diet has been linked to a reduced risk of cognitive decline. Focus on incorporating these foods at your own pace, aiming for consistent, balanced meals.  Sleep and Chronic Conditions: Getting adequate, quality sleep is essential for healthy aging. Despite a recent improvement in your sleep, you still may benefit from the consistent implementation of sleep hygiene techniques, including:  Go to bed and get up at the same time each day to help your body establish a regular rhythm.  Establish and maintain a bedtime routine. Certain activities such as stretching, meditating, listening to soft music, or reading ~15 minutes before bedtime can be a great way to regularly get your brain and body ready for  sleep.  Avoid taking naps during the day.  Avoid alcohol and caffeine for 5 or 6 hours before going to bed.  Get regular exercise, but not in the hours before  bedtime.  Use comfortable bedding and maintain a cool temperature in your bedroom.  Block out light and distracting noise.  Avoid watching television or using your phone/computer in bed.  Avoid staying in bed if you have difficulty falling asleep. If you have not been able to get to sleep after about 20 minutes or more, get up and do something calming or boring until you feel sleepy, then return to bed and try again.  Continue staying socially and mentally engaged. Maintaining strong social connections and regularly stimulating your brain can help protect against cognitive decline. This includes staying connected with friends and family, volunteering, or participating in community groups. Mentally engaging activities--such as reading, doing puzzles, playing strategy games, or learning a new language or musical instrument--promote brain plasticity. If you are interested in activities to support cognitive engagement, this site offers a variety of apps and games organized by difficulty level:  https://www.barrowneuro.org/get-to-know-barrow/centers-programs/neurorehabilitation-center/neuro-rehab-apps-and-games/  Consider implementing compensatory strategies to maximize independence and maintain daily functioning. Examples include:   Adhere to routine. Compensatory strategies work best when they are used consistently. Use a planner, calendar, or white board that has the schedule and important events for the day clearly listed to reference and cross off when tasks are complete.  Ask for written information, especially if it is new or unfamiliar (e.g., information provided at a doctor's appointment).   Create an organized environment. Keep items that can be easily misplaced in a sensible location and get into the habit of always returning  the items to those places.  Pay attention and reduce distractions. Make a point of focusing attention on information you want to remember. One-on-one interaction is more likely to facilitate attention and minimize distraction. Make eye contact and repeat the information out loud after you hear it. Reduce interruptions or distractions especially when attempting to learn new information.   Create associations. When learning something new, think about and understand the information. Explain it in your own words or try to associate it with something you already know. Take notes to help remember important details.  Evaluate goals and plan accordingly. When confronted by many different tasks, begin by making a list that prioritizes each task and estimates the time it will take to complete. Break down complicated tasks into smaller, more manageable steps.   Focus on one task at a time and complete each task before starting another. Avoid multitasking.  DISPOSITION   Patient will follow up with the referring provider, Ms. Wertman. No follow-up neuropsychological testing was scheduled at this time. Please feel free to refer the patient for repeated evaluation if he shows a significant change in neurocognitive status. He will be provided verbal feedback in approximately one week regarding the findings and impression during this visit.  The remainder of the report includes the details of the patient's background and a table of results from the current evaluation, which support the summary and recommendations described above.  BACKGROUND   History of Presenting Illness: The following information was obtained from a review of medical records and an interview with the patient. Patient reported a history of stroke in 2011. Details of the event are not available in the medical record, as the patient was residing in [place]  at the time. One note in the chart suggests that it may have involved the left frontal  lobe, but today the patient stated it involved the left temporal lobe. He reported no loss of language abilities but did experience notable short-term memory loss and confusion, which is  what prompted his wife to take him to the hospital. He was told the stroke was very small. Most recent MRI did not mention evidence of a remote stroke. Memory impairment reportedly resolved within the years following the stroke. It is unclear if this was actually a transient ischemic attack. Patient recently established care with Camie Sevin, PA-C, at [facility] on 04/01/2024 due to memory concerns over the last year. MoCA = 29/30. He was referred for neuropsychological evaluation accordingly.  Cognitive Functioning: During today's visit, the patient reported cognitive changes since earlier this year. He noted some relative improvement since discontinuing Flonase , which he felt had been contributing to increased edginess and poor sleep. He also reported several recent medical stressors, including removal of a stomach tumor, kidney stone surgery, and anemia, which he believes have impacted his overall functioning. Regarding cognitive symptoms, he endorsed occasional forgetfulness, such as missing details of conversations and sometimes forgetting scheduled appointments if distracted (e.g., by a phone call), despite previously being aware of the appointment. He has not noticed issues with long-term memory. He reported relatively slowed processing speed and some word-finding difficulties. He described himself as "a bit more messy," but denied significant difficulties with planning, organizing, or problem-solving. He also denied major problems with attention or navigation.  Physical Functioning: Patient reported improved sleep over the past few weeks. He had been taking Benadryl  nightly for sinus issues, which he felt contributed to daytime fatigue, but has recently stopped. Despite these improvements, he continues to  experience daytime fatigue. Appetite has been somewhat reduced following removal of a stomach tumor, though he is not particularly concerned, as he is interested in losing weight. He reported no major changes in sense of taste or smell but suspects a mild decline, possibly related to sinus issues. He wears progressive lenses for vision. He had a hearing evaluation last week and was advised to pursue hearing aids, which he is now doing. He feels his balance is not as good as it used to be, though he denied any recent falls. He also denied experiencing tremors.  Emotional Functioning: Patient described his recent mood as pretty good. He denied suicidal ideation. He remains active by working full-time and expressed interest in pursuing a more challenging position. He does not exercise much anymore due to fatigue.  Imaging: MRI of the brain (04/30/2024) documented minimal chronic small vessel ischemic changes within the cerebral white matter.  Other Relevant Medical History: Remarkable for dyslipidemia, coronary artery disease, and degenerative disc disease. Please refer to the medical record for a more comprehensive problem list. Patient reported numerous sports-related concussions throughout his life. He recalled losing consciousness on only one occasion, for approximately 90 seconds. The only lingering symptom he experienced with some of these injuries was headache. No history of CNS infection or seizure was reported.  Current Medications and Vitamins/Supplements: Per record, aspirin, azelastine , B complex vitamin, coenzyme Q10, collagen hydrolysate, diphenhydramine , magnesium, and omeprazole .   Functional Status: Patient independently performs all basic and instrumental (e.g., driving, finances, medications) activities of daily living without difficulty.  Family Neurological History: Remarkable for unspecified dementia (father, paternal grandmother).  Psychiatric History: History of depression,  anxiety, prior mental health treatment, suicidal ideation, hallucinations, and psychiatric hospitalizations was not reported. He acknowledged experiencing intermittent symptoms of depression in the past, which he attributed primarily to medical changes and work-related stress at the time.  Substance Use History: Patient reported rare alcohol consumption. He acknowledged a history of heavy alcohol use in the past but stated that  he stopped drinking without formal treatment. Current use of nicotine, marijuana, and illicit substances was denied.  Social and Developmental History: Patient was born in [place], [place]. History of perinatal complications and developmental delays was not reported. He is married and lives with his wife in their private residence. He has one child.   Educational and Occupational History: No history of childhood learning disability, special education services, or grade retention was reported. Patient described himself as a B student during earlier schooling, noting that he did not always put in full effort due to a greater interest in sports. However, by the time he reached college, he was earning straight As. He obtained a bachelor's degree in business. 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 He expressed a desire to pursue a higher-level position at this time. Previously, he held a Teaching laboratory technician role.   BEHAVIORAL OBSERVATIONS   Patient arrived a few minutes late and was unaccompanied. He ambulated independently and without gait disturbance. He was alert and fully oriented. He was appropriately groomed and dressed for the setting. No significant sensory or motor abnormalities were observed. Vision (with glasses) and hearing were adequate for testing purposes. Speech was of normal rate, prosody, and volume. No conversational word-finding  difficulties, paraphasic errors, or dysarthria were observed. Comprehension was conversationally intact. Thought processes were linear, logical, and coherent. Thought content was organized and devoid of delusions. Insight appeared appropriate. Affect was even and congruent with euthymic mood. He was cooperative and gave adequate effort during testing, including on standalone and embedded measures of performance validity. Results are thought to accurately reflect his cognitive functioning at this time.  NEUROPSYCHOLOGICAL TESTING RESULTS   Tests Administered: Animal Naming Test; Brief Visuospatial Memory Test-Revised (BVMT-R) - Form 1; Controlled Oral Word Association Test (COWAT): FAS; Geriatric Anxiety Scale-10 Item (GAS-10); Geriatric Depression Scale Short Form (GDS-SF); Grooved Pegboard Test; [employer] Verbal Learning Test Revised (HVLT-R) - From 1; Judgment of Line Orientation (JLO) - Form V; Neuropsychological Assessment Battery (NAB) - Subtest(s): Naming Form 1; Standalone performance validity test (PVT); Test of Premorbid Functioning (TOPF); Trail Making Test (TMT); Wechsler Adult Intelligence Scale Fifth Edition (WAIS-5) - Subtest(s): Similarities, Clinical cytogeneticist, Matrix Reasoning, Digit Sequencing, Coding, Running Digits, Symbol Search, Symbol Span; Wechsler Memory Scale Fourth Edition (WMS-IV) - Subtest(s): Logical Memory (LM); and [place]  Card Sorting Test 64 Card Version (WCST-64).  Test results are provided in the table below. Whenever possible, the patient's scores were compared against age-, sex-, and education-corrected normative samples. Interpretive descriptions are based on the AACN consensus conference statement on uniform labeling (Guilmette et al., 2020).  PREMORBID FUNCTIONING RAW  RANGE  TOPF 54 StdS=111 High Average  ATTENTION & WORKING MEMORY RAW  RANGE  WAIS-5 Digit Sequencing -- ss=13 High Average  WAIS-5 Running Digits -- ss=16 Exceptionally High  WAIS-5 Symbol Span -- ss=8  Average  PROCESSING SPEED RAW  RANGE  Trails A 15''0e T=77 Exceptionally High  WAIS-5 Coding  -- ss=11 Average  WAIS-5 Symbol Search -- ss=14 High Average  EXECUTIVE FUNCTION RAW  RANGE  Trails B 70''1e T=53 Average  WAIS-5 Matrix Reasoning -- ss=10 Average  WAIS-5 Similarities -- ss=10 Average  COWAT Letter Fluency 19+18+22 T=66 Above Average  WCST-64 Total Errors 8 T=71 Exceptionally High  WCST-64 Perseverative Errors 4 T>80 Exceptionally High  WCST-64 Nonperseverative Errors 4 T=57 High Average  WCST-64 Categories Completed 5 >16%ile WNL  TRDM-35 FMS 0 -- --  LANGUAGE RAW  RANGE  COWAT Letter Fluency 19+18+22 T=66 Above Average  Animal Naming Test 25 T=63 High Average  NAB Naming Test 31/31 T=55 WNL  VISUOSPATIAL RAW  RANGE  WAIS-5 Block Design -- ss=12 High Average  JLO C/S=33/30 86+%ile WNL  BVMT-R Copy Trial 11/12 -- WNL  VERBAL LEARNING & MEMORY RAW  RANGE  HVLT Learning Trials (6+7+10)/36 T=42 Low Average  HVLT Delayed Recall 6/12 T=32 Below Average  HVLT Recognition Hits 12 -- --  HVLT Recognition False Positives 1 -- --  HVLT Discrimination Index 11 T=52 Average  WMS-IV LM-I  (10+14+13)/53 ss=12 High Average  WMS-IV LM-II  (9+11)/39 ss=11 Average  WMS-IV LM Recognition  (8+13)/23 51-75%ile Average  VISUAL LEARNING & MEMORY RAW  RANGE  BVMT-R Total Recall (5+8+9)/36 T=51 Average  BVMT-R Delayed Recall 9/12 T=53 Average  BVMT-R Percent Retained 100 >16%ile WNL  BVMT-R Recognition Hits 6 >16%ile WNL  BVMT-R Recognition False Alarms 0 >16%ile WNL  BVMT-R Recognition Discrimination Index 6 >16%ile WNL  FINE MOTOR DEXTERITY RAW  RANGE  Grooved Pegboard (Dominant Hand) 73''0d T=52 Average  Grooved Pegboard (Non-Dominant Hand) 88''0d T=47 Average  QUESTIONNAIRES RAW  RANGE  GDS-SF 3 -- Minimal  GAS-10 4 -- Minimal  *Note: ss = scaled score; StdS = standard score; T = t-score; C/S = corrected raw score; WNL = within normal limits; BNL= below normal limits; D/C =  discontinued. Scores from skewed distributions are typically interpreted as WNL (>=16th %ile) or BNL (<16th %ile).   INFORMED CONSENT   Patient was provided with a verbal description of the nature and purpose of the neuropsychological evaluation. Also reviewed were the foreseeable risks and/or discomforts and benefits of the procedure, limits of confidentiality, and mandatory reporting requirements of this provider. Patient was given the opportunity to have their questions answered. Oral consent to participate was provided by the patient.   This report was prepared as part of a clinical evaluation and is not intended for forensic use.  SERVICE   This evaluation was conducted by Renda Beckwith, Psy.D. In addition to time spent directly with the patient, total professional time (120 minutes) includes record review, integration of relevant medical history, test selection, interpretation of findings, and report preparation. A technician, Luke Pitcher, B.S., provided testing and scoring assistance (100 minutes).  Psychiatric Diagnostic Evaluation Services (Professional): [phone] x 1 Neuropsychological Testing Evaluation Services (Professional): [phone] x 1 Neuropsychological Testing Evaluation Services (Professional): 03866 x 1 Neuropsychological Test Administration and Scoring Radiographer, therapeutic): [phone] x 1 Neuropsychological Test Administration and Scoring (Technician): [phone]  This report was generated using voice recognition software. While this document has been carefully reviewed, transcription errors may be present. I apologize in advance for any inconvenience. Please contact me if further clarification is needed.            Renda Beckwith, Psy.D.             Neuropsychologist

## 2024-05-21 ENCOUNTER — Ambulatory Visit (INDEPENDENT_AMBULATORY_CARE_PROVIDER_SITE_OTHER): Admitting: Psychology

## 2024-05-21 DIAGNOSIS — R419 Unspecified symptoms and signs involving cognitive functions and awareness: Secondary | ICD-10-CM | POA: Diagnosis not present

## 2024-05-21 NOTE — Progress Notes (Signed)
   NEUROPSYCHOLOGY FEEDBACK SESSION [place]  [place] Department of Neurology  Date of Feedback Session: 05/21/2024  REASON FOR REFERRAL   Tyler Obrien is a 63 year old, right-handed, White male with 16 years of formal education. He was referred for neuropsychological evaluation by his neurologist, Camie Sevin, PA-C, to assess current neurocognitive functioning, document potential cognitive deficits, and assist with treatment planning. This is his first neuropsychological evaluation.  FEEDBACK   Patient completed a comprehensive neuropsychological evaluation on 05/13/2024. Please refer to that encounter for the full report and recommendations. Briefly, results indicated normal cognitive functioning, with several scores notably exceeding expectations. He does not show evidence of a neurocognitive disorder at this time. His overall profile reflects healthy cognitive aging and well-preserved functional abilities. Subjective cognitive concerns are likely multifactorial, related to normal aging, several recent health stressors, persistent fatigue, and regular Benadryl  use until recently.   Today, the patient was unaccompanied. He was provided verbal feedback regarding the findings and impression during this visit, and his questions were answered. A copy of the report was provided at the conclusion of the visit.  DISPOSITION   Patient will follow up with the referring provider, Ms. Wertman. No follow-up neuropsychological testing was scheduled at this time. Please feel free to refer the patient for repeated evaluation if he shows a significant change in neurocognitive status.  SERVICE   This feedback session was conducted by Renda Beckwith, Psy.D. One unit of [phone] (35 minutes) was billed for Dr. Beckwith' time spent in preparing, conducting, and documenting the current feedback session.  This report was generated using voice recognition software. While this document has been  carefully reviewed, transcription errors may be present. I apologize in advance for any inconvenience. Please contact me if further clarification is needed.

## 2024-05-31 ENCOUNTER — Ambulatory Visit

## 2024-05-31 ENCOUNTER — Ambulatory Visit: Admitting: Physician Assistant

## 2024-06-03 ENCOUNTER — Encounter: Payer: Self-pay | Admitting: Family

## 2024-06-10 ENCOUNTER — Encounter: Payer: Self-pay | Admitting: Allergy

## 2024-06-10 ENCOUNTER — Ambulatory Visit (INDEPENDENT_AMBULATORY_CARE_PROVIDER_SITE_OTHER): Admitting: Allergy

## 2024-06-10 ENCOUNTER — Other Ambulatory Visit: Payer: Self-pay

## 2024-06-10 VITALS — BP 120/82 | HR 74 | Temp 97.6°F | Ht 68.11 in | Wt 181.6 lb

## 2024-06-10 DIAGNOSIS — J310 Chronic rhinitis: Secondary | ICD-10-CM

## 2024-06-10 MED ORDER — AZELASTINE HCL 0.1 % NA SOLN: 2.0000 | Freq: Two times a day (BID) | NASAL | 3 refills | Status: AC

## 2024-06-10 MED ORDER — FEXOFENADINE HCL 180 MG PO TABS: 180.0000 mg | ORAL_TABLET | Freq: Every day | ORAL | 3 refills | Status: AC

## 2024-06-10 NOTE — Patient Instructions (Signed)
 Chronic allergic rhinitis with nasal congestion and postnasal drip Chronic nasal congestion and postnasal drip, primarily left-sided, with partial relief from azelastine . Intolerant to steroid nasal sprays.  Environmental allergies, especially oak pollen, likely exacerbating symptoms. - Increase azelastine  nasal spray to two sprays twice daily.   With using nasal sprays point tip of bottle toward eye on same side nostril and lean head slightly forward for best technique.   - Recommend updating allergy testing to identify current allergens. - Discuss potential use of Atrovent  nasal spray for additional mucus control if increased Azelastine  is not effective enough - Rotate between Claritin and Allegra  to assess efficacy. - Consider allergy shots if medication management is insufficient and allergy testing indicates significant allergens.  Schedule skin testing visit for allergen identification update and hold antihistamines for 3 days prior to testing (ie. Claritin, Allegra )

## 2024-06-10 NOTE — Progress Notes (Signed)
 New Patient Note  RE: Tyler Obrien MRN: [id number] DOB: 04/20/1961 Date of Office Visit: 06/10/2024  Primary care provider: Dorina Loving, PA-C  Chief Complaint: allergies  History of present illness: Tyler Obrien is a 63 y.o. male presenting today for evaluation of allergies. Discussed the use of AI scribe software for clinical note transcription with the patient, who gave verbal consent to proceed.  He experiences persistent sinus congestion primarily on the left side, ongoing for many years. Despite undergoing sinus surgery in 2010, including septum correction, the congestion persists though recurrent sinus infections have improved. Occasional nasal drainage and a history of post-nasal drip since childhood are noted. Saline rinses are performed occasionally, but clearing water from the left side is difficult. He uses azelastine  nasal spray, one squirt in each nostril once a day, for the past year or two, which is sometimes helpful. He avoids steroid nasal sprays like Flonase  due to adverse effects with steroids such as increased heart rate and mood changes.  He has had allergy testing before revealing a significant allergy to oak trees of which he recalls. He experiences occasional eye symptoms, for which he uses Pataday , and takes Claritin for allergy relief. He has tried other antihistamines like Zyrtec and Xyzal but found them less tolerable. His allergies have worsened since moving to [place] .  He experiences ringing in both ears, primarily noticeable in quiet environments. An audiologist suggested hearing aids might help with his hearing issues, though they may not address the ringing directly.  He has sinus infections about once a year, typically treated with antibiotics. He experiences low-grade fevers and overheating sensations during infections. No frequent throat clearing, significant sneezing, or eczema. He avoids cooked fish with scales due to swallowing  difficulties but tolerates sushi/sashimi.      Review of systems: 10pt ROS negative noted above in HPI   Past medical history: Past Medical History:  Diagnosis Date   Allergy    Anemia    Arthritis    Bleeding gastric ulcer    Complication of anesthesia    CVA (cerebral infarction) 2011   Diverticulosis    GERD (gastroesophageal reflux disease)    prilosec as needed- upset stomach mostly - started after gastric bleeding ulcer   Head concussion    History of blood transfusion 1992   gi bleed   History of kidney stones    Stroke [facility]) 2011    Past surgical history: Past Surgical History:  Procedure Laterality Date   APPENDECTOMY     BIOPSY  05/29/2023   Procedure: BIOPSY;  Surgeon: [name], MD;  Location: [facility];  Service: Gastroenterology;;   COLONOSCOPY     10 yrs ago    cyst removal from scrotum     cyst removed from elbow     CYSTOSCOPY/URETEROSCOPY/HOLMIUM LASER/STENT PLACEMENT Left 11/20/2022   Procedure: LEFT URETEROSCOPY/HOLMIUM LASER/STENT PLACEMENT;  Surgeon: [name], MD;  Location: [facility];  Service: Urology;  Laterality: Left;   ESOPHAGOGASTRODUODENOSCOPY N/A 05/29/2023   Procedure: ESOPHAGOGASTRODUODENOSCOPY (EGD);  Surgeon: [name], MD;  Location: [facility];  Service: Gastroenterology;  Laterality: N/A;   EUS N/A 05/29/2023   Procedure: UPPER ENDOSCOPIC ULTRASOUND (EUS) RADIAL;  Surgeon: [name], MD;  Location: [facility];  Service: Gastroenterology;  Laterality: N/A;   FINE NEEDLE ASPIRATION N/A 05/29/2023   Procedure: FINE NEEDLE ASPIRATION (FNA) LINEAR;  Surgeon: [name], MD;  Location: [facility];  Service: Gastroenterology;  Laterality: N/A;   FINGER  FRACTURE SURGERY Right    5th finger   KIDNEY STONE SURGERY     LAPAROSCOPIC GASTRECTOMY N/A 07/30/2023   Procedure: LAPAROSCOPIC PARTIAL GASTRECTOMY;  Surgeon: [name], MD;  Location: [facility];  Service: General;   Laterality: N/A;  HARMONIC SCALPEL ECHELON STAPLER   NASAL SINUS SURGERY  08/2009   OPEN ANTERIOR SHOULDER RECONSTRUCTION     UPPER GASTROINTESTINAL ENDOSCOPY     10 yrs ago    WISDOM TOOTH EXTRACTION      Family history:  Family History  Problem Relation Age of Onset   Allergic rhinitis Mother    Cancer Mother    Endometrial cancer Mother    Cancer Father        Lung CA   Hyperlipidemia Father    Hypertension Father    Lung cancer Father    Allergic rhinitis Sister    Allergic rhinitis Brother    Stomach cancer Maternal Grandfather    Colon cancer Neg Hx    Colon polyps Neg Hx    Esophageal cancer Neg Hx    Rectal cancer Neg Hx     Social history: Lives in a condo without carpeting with electric heating and central cooling.  Cats in the home.  There is no concern for water damage, mildew or roaches in the home.  He works in [employer].  Denies a smoking history.   Medication List: Current Outpatient Medications  Medication Sig Dispense Refill   aspirin EC 81 MG tablet Take 81 mg by mouth daily.     azelastine  (ASTELIN ) 0.1 % nasal spray Place 2 sprays into both nostrils 2 (two) times daily as needed for allergies or rhinitis.     b complex vitamins tablet Take 1 tablet by mouth daily.     Coenzyme Q10 (COQ-10 PO) Take 1 capsule by mouth daily.     Collagen Hydrolysate POWD Take 1 Scoop by mouth daily at 6 (six) AM. Collagen powder     diphenhydrAMINE  (BENADRYL ) 25 mg capsule Take 25-50 mg by mouth at bedtime as needed for sleep or allergies.     guaiFENesin (MUCINEX) 600 MG 12 hr tablet Take 600 mg by mouth 2 (two) times daily as needed for cough or to loosen phlegm.     Magnesium 250 MG TABS Take 250 mg by mouth daily.     omeprazole  (PRILOSEC) 40 MG capsule Take 1 capsule (40 mg total) by mouth daily. 90 capsule 3   No current facility-administered medications for this visit.    Known medication allergies: Allergies  Allergen Reactions   Nsaids Other (See Comments)     BLEEDING/ULCER   Lidocaine -Menthol Rash    Only the patches   Prednisone  Palpitations     Physical examination: Blood pressure 120/82, pulse 74, temperature 97.6 F (36.4 C), temperature source Temporal, height 5' 8.11 (1.73 m), weight 181 lb 9.6 oz (82.4 kg), SpO2 96%.  General: Alert, interactive, in no acute distress. HEENT: PERRLA, TMs pearly gray, turbinates moderately edematous L>R without discharge, post-pharynx non erythematous. Neck: Supple without lymphadenopathy. Lungs: Clear to auscultation without wheezing, rhonchi or rales. {no increased work of breathing. CV: Normal S1, S2 without murmurs. Abdomen: Nondistended, nontender. Skin: Warm and dry, without lesions or rashes. Extremities:  No clubbing, cyanosis or edema. Neuro:   Grossly intact.  Diagnostics/Labs: None today  Assessment and plan:   Chronic allergic rhinitis with nasal congestion and postnasal drip Chronic nasal congestion and postnasal drip, primarily left-sided, with partial relief from azelastine . Intolerant to steroid  nasal sprays.  Environmental allergies, especially oak pollen, likely exacerbating symptoms. - Increase azelastine  nasal spray to two sprays twice daily.   With using nasal sprays point tip of bottle toward eye on same side nostril and lean head slightly forward for best technique.   - Recommend updating allergy testing to identify current allergens. - Discuss potential use of Atrovent  nasal spray for additional mucus control if increased Azelastine  is not effective enough - Rotate between Claritin and Allegra  to assess efficacy. - Consider allergy shots if medication management is insufficient and allergy testing indicates significant allergens.  Schedule skin testing visit for allergen identification update and hold antihistamines for 3 days prior to testing (ie. Claritin, Allegra ) (Env 1-55)  I appreciate the opportunity to take part in Kyston's care. Please do not hesitate to  contact me with questions.  Sincerely,   Danita Brain, MD Allergy/Immunology [facility] 

## 2024-06-11 ENCOUNTER — Inpatient Hospital Stay: Attending: Hematology & Oncology

## 2024-06-11 DIAGNOSIS — K922 Gastrointestinal hemorrhage, unspecified: Secondary | ICD-10-CM | POA: Insufficient documentation

## 2024-06-11 DIAGNOSIS — R202 Paresthesia of skin: Secondary | ICD-10-CM | POA: Insufficient documentation

## 2024-06-11 DIAGNOSIS — Z886 Allergy status to analgesic agent status: Secondary | ICD-10-CM | POA: Insufficient documentation

## 2024-06-11 DIAGNOSIS — C49A2 Gastrointestinal stromal tumor of stomach: Secondary | ICD-10-CM | POA: Diagnosis not present

## 2024-06-11 DIAGNOSIS — Z79899 Other long term (current) drug therapy: Secondary | ICD-10-CM | POA: Insufficient documentation

## 2024-06-11 DIAGNOSIS — D500 Iron deficiency anemia secondary to blood loss (chronic): Secondary | ICD-10-CM | POA: Diagnosis present

## 2024-06-11 DIAGNOSIS — R5383 Other fatigue: Secondary | ICD-10-CM | POA: Diagnosis not present

## 2024-06-11 DIAGNOSIS — M792 Neuralgia and neuritis, unspecified: Secondary | ICD-10-CM | POA: Diagnosis not present

## 2024-06-11 DIAGNOSIS — D509 Iron deficiency anemia, unspecified: Secondary | ICD-10-CM

## 2024-06-11 DIAGNOSIS — Z888 Allergy status to other drugs, medicaments and biological substances status: Secondary | ICD-10-CM | POA: Insufficient documentation

## 2024-06-11 LAB — CBC
HCT: 46.6 % (ref 39.0–52.0)
Hemoglobin: 16 g/dL (ref 13.0–17.0)
MCH: 30.5 pg (ref 26.0–34.0)
MCHC: 34.3 g/dL (ref 30.0–36.0)
MCV: 88.8 fL (ref 80.0–100.0)
Platelets: 182 K/uL (ref 150–400)
RBC: 5.25 MIL/uL (ref 4.22–5.81)
RDW: 12.6 % (ref 11.5–15.5)
WBC: 7.2 K/uL (ref 4.0–10.5)
nRBC: 0 % (ref 0.0–0.2)

## 2024-06-11 LAB — IRON AND IRON BINDING CAPACITY (CC-WL,HP ONLY)
Iron: 130 ug/dL (ref 45–182)
Saturation Ratios: 46 % — ABNORMAL HIGH (ref 17.9–39.5)
TIBC: 286 ug/dL (ref 250–450)
UIBC: 156 ug/dL

## 2024-06-11 LAB — FERRITIN: Ferritin: 220 ng/mL (ref 24–336)

## 2024-06-14 ENCOUNTER — Encounter: Admitting: Psychology

## 2024-06-15 ENCOUNTER — Inpatient Hospital Stay (HOSPITAL_BASED_OUTPATIENT_CLINIC_OR_DEPARTMENT_OTHER): Admitting: Medical Oncology

## 2024-06-15 ENCOUNTER — Inpatient Hospital Stay

## 2024-06-15 VITALS — BP 131/88 | HR 82 | Temp 97.9°F | Resp 18 | Ht 69.0 in | Wt 181.1 lb

## 2024-06-15 DIAGNOSIS — D500 Iron deficiency anemia secondary to blood loss (chronic): Secondary | ICD-10-CM | POA: Diagnosis not present

## 2024-06-15 DIAGNOSIS — D509 Iron deficiency anemia, unspecified: Secondary | ICD-10-CM

## 2024-06-15 DIAGNOSIS — C49A2 Gastrointestinal stromal tumor of stomach: Secondary | ICD-10-CM | POA: Diagnosis not present

## 2024-06-15 NOTE — Progress Notes (Signed)
 Hematology and Oncology Follow Up Visit  Tyler Obrien [id number] 1961/01/06 63 y.o. 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 Wilhelmenia - Repeat CT in Oct 2025 recommended by Dr. Albertus. Colonoscopy due in Dec 2030.   Current Therapy:   IV iron  as indicated- Venofer - last dose 05/02/2023   Interim History:  Tyler Obrien is here today for follow-up.   He had his GIST tumor removed on 07/30/2023. Pathology showed a 4.1 cm tumor with negative margins and low mitotic index. No adjuvant treatment recommended due to low-risk nature of the tumor. -CT scan in one year for surveillance per Dr. Aron.  He reports today that he has been well.  No blood loss noted. No bruising or petechiae. He reports that he tolerated the iron  infusions well. They helped his fatigue, nerve pain and racing heart sensation. These symptoms have not returned although fatigue is present when he is more active. He denies snoring.  No fever, chills, n/v, cough, rash, dizziness, SOB, chest pain or changes in bladder habits.  No swelling in his extremities.  He has occasional tingling in his left arm and sides of his feet.  No falls or syncope reported.   Wt Readings from Last 3 Encounters:  06/15/24 181 lb 1.6 oz (82.1 kg)  06/10/24 181 lb 9.6 oz (82.4 kg)  04/01/24 190 lb (86.2 kg)   ECOG Performance Status: 1 - Symptomatic but completely ambulatory  Medications:  Allergies as of 06/15/2024       Reactions   Nsaids Other (See Comments)   BLEEDING/ULCER   Lidocaine -menthol Rash   Only the patches   Prednisone  Palpitations        Medication List        Accurate as of June 15, 2024  9:33 AM. If you have any questions, ask your nurse or doctor.          aspirin EC 81 MG tablet Take 81 mg by mouth daily.   azelastine  0.1 % nasal spray Commonly known as: ASTELIN  Place 2 sprays into both nostrils 2 (two) times daily.   b complex vitamins tablet Take 1  tablet by mouth daily.   Collagen Hydrolysate Powd Take 1 Scoop by mouth daily at 6 (six) AM. Collagen powder   COQ-10 PO Take 1 capsule by mouth daily.   diphenhydrAMINE  25 mg capsule Commonly known as: BENADRYL  Take 25-50 mg by mouth at bedtime as needed for sleep or allergies.   fexofenadine  180 MG tablet Commonly known as: ALLEGRA  Take 1 tablet (180 mg total) by mouth daily.   guaiFENesin 600 MG 12 hr tablet Commonly known as: MUCINEX Take 600 mg by mouth 2 (two) times daily as needed for cough or to loosen phlegm.   Magnesium 250 MG Tabs Take 250 mg by mouth daily.   omeprazole  40 MG capsule Commonly known as: PRILOSEC Take 1 capsule (40 mg total) by mouth daily.        Allergies:  Allergies  Allergen Reactions   Nsaids Other (See Comments)    BLEEDING/ULCER   Lidocaine -Menthol Rash    Only the patches   Prednisone  Palpitations    Past Medical History, Surgical history, Social history, and Family History were reviewed and updated.  Review of Systems: All other 10 point review of systems is negative.   Physical Exam:  height is 5' 9 (1.753 m) and weight is 181 lb 1.6 oz (82.1 kg). His oral temperature is 97.9 F (36.6  C). His blood pressure is 131/88 and his pulse is 82. His respiration is 18 and oxygen saturation is 96%.   Wt Readings from Last 3 Encounters:  06/15/24 181 lb 1.6 oz (82.1 kg)  06/10/24 181 lb 9.6 oz (82.4 kg)  04/01/24 190 lb (86.2 kg)    Ocular: Sclerae unicteric, pupils equal, round and reactive to light Ear-nose-throat: Oropharynx clear, dentition fair Lymphatic: No cervical or supraclavicular adenopathy Lungs no rales or rhonchi, good excursion bilaterally Heart regular rate and rhythm, no murmur appreciated Abd soft, nontender, positive bowel sounds MSK no focal spinal tenderness, no joint edema Neuro: non-focal, well-oriented, appropriate affect   Lab Results  Component Value Date   WBC 7.2 06/11/2024   HGB 16.0  06/11/2024   HCT 46.6 06/11/2024   MCV 88.8 06/11/2024   PLT 182 06/11/2024   Lab Results  Component Value Date   FERRITIN 220 06/11/2024   IRON  130 06/11/2024   TIBC 286 06/11/2024   UIBC 156 06/11/2024   IRONPCTSAT 46 (H) 06/11/2024   Lab Results  Component Value Date   RETICCTPCT 1.0 07/23/2023   RBC 5.25 06/11/2024   No results found for: KPAFRELGTCHN, LAMBDASER, KAPLAMBRATIO No results found for: IGGSERUM, IGA, IGMSERUM No results found for: STEPHANY CARLOTA BENSON MARKEL EARLA JOANNIE DOC VICK, SPEI   Chemistry      Component Value Date/Time   NA 137 03/09/2024 0938   NA 141 02/25/2023 1436   K 4.6 03/09/2024 0938   CL 103 03/09/2024 0938   CO2 29 03/09/2024 0938   BUN 16 03/09/2024 0938   BUN 18 02/25/2023 1436   CREATININE 1.17 03/09/2024 0938   CREATININE 1.37 (H) 09/10/2018 0844      Component Value Date/Time   CALCIUM  9.7 03/09/2024 0938   ALKPHOS 50 03/09/2024 0938   AST 73 (H) 03/09/2024 0938   ALT 30 03/09/2024 0938   BILITOT 0.6 03/09/2024 9061      Encounter Diagnoses  Name Primary?   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 Currently on PRN IV iron . His last infusion was on 05/02/2023.  CT scan for follow up for his GIST is next month. He will continue his PPI and follow up with GI.   Today his Hgb is 16.0 with MCV of 88.8 Iron  studies are pending. Will replace if needed. He will consider cardiology consult given fatigue with activity. May also want to trial OTC 1,000 international U of vitamin D .    RTC 9 months APP, lab (CBC, iron , ferritin) -[place]   Lauraine CHRISTELLA Dais, PA-C 9/9/20259:33 AM

## 2024-06-24 ENCOUNTER — Ambulatory Visit: Admitting: Allergy

## 2024-07-01 ENCOUNTER — Ambulatory Visit (INDEPENDENT_AMBULATORY_CARE_PROVIDER_SITE_OTHER): Admitting: Allergy

## 2024-07-01 ENCOUNTER — Encounter: Payer: Self-pay | Admitting: Allergy

## 2024-07-01 DIAGNOSIS — R0982 Postnasal drip: Secondary | ICD-10-CM

## 2024-07-01 DIAGNOSIS — J3089 Other allergic rhinitis: Secondary | ICD-10-CM

## 2024-07-01 DIAGNOSIS — J310 Chronic rhinitis: Secondary | ICD-10-CM

## 2024-07-01 NOTE — Progress Notes (Signed)
 Follow-up Note  RE: Tyler Obrien MRN: [id number] DOB: 10-08-1960 Date of Office Visit: 07/01/2024   History of present illness: Tyler Obrien is a 63 y.o. male presenting today for skin testing visit.  He was last seen in the office on 06/10/24 for chronic rhinitis.  He is in his usual state of health today without recent illness.  He has held antihistamines for at least 3 days for testing today.   Medication List: Current Outpatient Medications  Medication Sig Dispense Refill   aspirin EC 81 MG tablet Take 81 mg by mouth daily.     azelastine  (ASTELIN ) 0.1 % nasal spray Place 2 sprays into both nostrils 2 (two) times daily. 30 mL 3   b complex vitamins tablet Take 1 tablet by mouth daily.     Coenzyme Q10 (COQ-10 PO) Take 1 capsule by mouth daily.     Collagen Hydrolysate POWD Take 1 Scoop by mouth daily at 6 (six) AM. Collagen powder     diphenhydrAMINE  (BENADRYL ) 25 mg capsule Take 25-50 mg by mouth at bedtime as needed for sleep or allergies.     fexofenadine  (ALLEGRA ) 180 MG tablet Take 1 tablet (180 mg total) by mouth daily. 30 tablet 3   guaiFENesin (MUCINEX) 600 MG 12 hr tablet Take 600 mg by mouth 2 (two) times daily as needed for cough or to loosen phlegm.     Magnesium 250 MG TABS Take 250 mg by mouth daily.     omeprazole  (PRILOSEC) 40 MG capsule Take 1 capsule (40 mg total) by mouth daily. 90 capsule 3   No current facility-administered medications for this visit.     Known medication allergies: Allergies  Allergen Reactions   Nsaids Other (See Comments)    BLEEDING/ULCER   Lidocaine -Menthol Rash    Only the patches   Prednisone  Palpitations   Diagnostics/Labs:  Allergy  testing:   Airborne Adult Perc - 07/01/24 0846     Time Antigen Placed 9153    Allergen Manufacturer Jestine    Location Back    Number of Test 55    1. Control-Buffer 50% Glycerol Negative    2. Control-Histamine Negative    3. Bahia Negative    4. [place] Negative    5. Johnson 2+     6. [place]  Blue Negative    7. Meadow Fescue 2+    8. Perennial Rye Negative    9. Timothy Negative    10. Ragweed Mix Negative    11. Cocklebur Negative    12. Plantain,  English Negative    13. Baccharis Negative    14. Dog Fennel Negative    15. [place] Thistle 2+    16. Lamb's Quarters Negative    17. Sheep Sorrell Negative    18. Rough Pigweed Negative    19. Marsh Elder, Rough Negative    20. Mugwort, Common Negative    21. Box, Elder 2+    22. Cedar, red Negative    23. Sweet Gum 4+    24. Pecan Pollen 2+    25. Pine Mix 2+    26. Walnut, Black Pollen Negative    27. Red Mulberry 2+    28. Ash Mix Negative    29. Birch Mix 2+    30. Beech American 4+    31. Cottonwood, [place] 2+    32. Hickory, White 2+    33. Maple Mix 2+    34. Oak, [place] Mix 4+    35. Sycamore  Eastern Negative    37. Cladosporium Herbarum Negative    38. Aspergillus Mix Negative    39. Penicillium Mix Negative    40. Bipolaris Sorokiniana (Helminthosporium) Negative    41. Drechslera Spicifera (Curvularia) Negative    42. Mucor Plumbeus Negative    43. Fusarium Moniliforme Negative    44. Aureobasidium Pullulans (pullulara) Negative    45. Rhizopus Oryzae Negative    46. Botrytis Cinera Negative    47. Epicoccum Nigrum Negative    48. Phoma Betae Negative    49. Dust Mite Mix Negative    50. Cat Hair 10,000 BAU/ml 2+    51.  Dog Epithelia Negative    52. Mixed Feathers Negative    53. Horse Epithelia Negative    54. Cockroach, German Negative    55. Tobacco Leaf Negative          Allergy  testing results were read and interpreted by provider, documented by clinical staff.   Assessment and plan: Chronic allergic rhinitis with nasal congestion and postnasal drip Chronic nasal congestion and postnasal drip, primarily left-sided, with partial relief from azelastine . Intolerant to steroid nasal sprays.  - Use azelastine  nasal spray to two sprays twice daily.   With using nasal  sprays point tip of bottle toward eye on same side nostril and lean head slightly forward for best technique.   - Discuss potential use of Atrovent  nasal spray for additional mucus control if Azelastine  is not effective enough - Rotate between Claritin and Allegra  to assess efficacy. - Testing today showed: grasses, weeds, trees, and cat - Copy of test results provided.  - Avoidance measures provided. - Consider allergy  shots as a means of long-term control. - Allergy  shots re-train and reset the immune system to ignore environmental allergens and decrease the resulting immune response to those allergens (sneezing, itchy watery eyes, runny nose, nasal congestion, etc).    - Allergy  shots improve symptoms in 75-85% of patients.  - We can discuss more at a future appointment if the medications are not working for you.  Follow-up in 4-6 months or sooner if needed  I appreciate the opportunity to take part in Tyler Obrien's care. Please do not hesitate to contact me with questions.  Sincerely,   Danita Brain, MD Allergy /Immunology [facility] [place]

## 2024-07-01 NOTE — Patient Instructions (Signed)
 Chronic allergic rhinitis with nasal congestion and postnasal drip Chronic nasal congestion and postnasal drip, primarily left-sided, with partial relief from azelastine . Intolerant to steroid nasal sprays.  - Use azelastine  nasal spray to two sprays twice daily.   With using nasal sprays point tip of bottle toward eye on same side nostril and lean head slightly forward for best technique.   - Discuss potential use of Atrovent  nasal spray for additional mucus control if Azelastine  is not effective enough - Rotate between Claritin and Allegra  to assess efficacy. - Testing today showed: grasses, weeds, trees, and cat - Copy of test results provided.  - Avoidance measures provided. - Consider allergy  shots as a means of long-term control. - Allergy  shots re-train and reset the immune system to ignore environmental allergens and decrease the resulting immune response to those allergens (sneezing, itchy watery eyes, runny nose, nasal congestion, etc).    - Allergy  shots improve symptoms in 75-85% of patients.  - We can discuss more at a future appointment if the medications are not working for you.  Follow-up in 4-6 months or sooner if needed

## 2024-07-02 ENCOUNTER — Other Ambulatory Visit: Payer: Self-pay | Admitting: General Surgery

## 2024-07-02 ENCOUNTER — Encounter: Payer: Self-pay | Admitting: General Surgery

## 2024-07-02 DIAGNOSIS — C49A2 Gastrointestinal stromal tumor of stomach: Secondary | ICD-10-CM

## 2024-07-05 ENCOUNTER — Other Ambulatory Visit: Payer: Self-pay | Admitting: General Surgery

## 2024-07-05 DIAGNOSIS — Z809 Family history of malignant neoplasm, unspecified: Secondary | ICD-10-CM

## 2024-07-05 DIAGNOSIS — C49A2 Gastrointestinal stromal tumor of stomach: Secondary | ICD-10-CM

## 2024-07-14 ENCOUNTER — Ambulatory Visit
Admission: RE | Admit: 2024-07-14 | Discharge: 2024-07-14 | Disposition: A | Discharge status: Home / Self Care | Source: Ambulatory Visit | Attending: General Surgery | Admitting: General Surgery

## 2024-07-14 DIAGNOSIS — C49A2 Gastrointestinal stromal tumor of stomach: Secondary | ICD-10-CM

## 2024-07-14 DIAGNOSIS — Z809 Family history of malignant neoplasm, unspecified: Secondary | ICD-10-CM

## 2024-07-14 MED ORDER — IOPAMIDOL (ISOVUE-370) INJECTION 76%
100.0000 mL | Freq: Once | INTRAVENOUS | Status: AC | PRN
  Administered 2024-07-14: 100 mL via INTRAVENOUS

## 2024-07-21 ENCOUNTER — Ambulatory Visit: Payer: Self-pay | Admitting: General Surgery

## 2024-07-27 ENCOUNTER — Ambulatory Visit: Admitting: Medical

## 2024-08-02 ENCOUNTER — Ambulatory Visit: Admitting: Dermatology

## 2024-08-02 ENCOUNTER — Ambulatory Visit (INDEPENDENT_AMBULATORY_CARE_PROVIDER_SITE_OTHER): Admitting: Medical

## 2024-08-02 ENCOUNTER — Ambulatory Visit (HOSPITAL_BASED_OUTPATIENT_CLINIC_OR_DEPARTMENT_OTHER)
Admission: RE | Admit: 2024-08-02 | Discharge: 2024-08-02 | Disposition: A | Discharge status: Home / Self Care | Source: Ambulatory Visit | Attending: Medical | Admitting: Medical

## 2024-08-02 ENCOUNTER — Ambulatory Visit: Payer: Self-pay | Admitting: Medical

## 2024-08-02 ENCOUNTER — Encounter: Payer: Self-pay | Admitting: Dermatology

## 2024-08-02 ENCOUNTER — Encounter: Payer: Self-pay | Admitting: Family

## 2024-08-02 ENCOUNTER — Encounter: Payer: Self-pay | Admitting: Medical

## 2024-08-02 ENCOUNTER — Ambulatory Visit (HOSPITAL_BASED_OUTPATIENT_CLINIC_OR_DEPARTMENT_OTHER)
Admission: RE | Admit: 2024-08-02 | Discharge: 2024-08-02 | Disposition: A | Source: Ambulatory Visit | Attending: Medical | Admitting: Medical

## 2024-08-02 VITALS — BP 159/11 | HR 75

## 2024-08-02 VITALS — BP 120/78 | HR 67 | Temp 97.6°F | Resp 14 | Ht 69.0 in | Wt 181.4 lb

## 2024-08-02 DIAGNOSIS — M79609 Pain in unspecified limb: Secondary | ICD-10-CM | POA: Insufficient documentation

## 2024-08-02 DIAGNOSIS — R000 Tachycardia, unspecified: Secondary | ICD-10-CM

## 2024-08-02 DIAGNOSIS — R5383 Other fatigue: Secondary | ICD-10-CM

## 2024-08-02 DIAGNOSIS — G8929 Other chronic pain: Secondary | ICD-10-CM

## 2024-08-02 DIAGNOSIS — Z1283 Encounter for screening for malignant neoplasm of skin: Secondary | ICD-10-CM

## 2024-08-02 DIAGNOSIS — D225 Melanocytic nevi of trunk: Secondary | ICD-10-CM

## 2024-08-02 DIAGNOSIS — R0600 Dyspnea, unspecified: Secondary | ICD-10-CM

## 2024-08-02 DIAGNOSIS — D229 Melanocytic nevi, unspecified: Secondary | ICD-10-CM

## 2024-08-02 DIAGNOSIS — W908XXA Exposure to other nonionizing radiation, initial encounter: Secondary | ICD-10-CM | POA: Diagnosis not present

## 2024-08-02 DIAGNOSIS — L821 Other seborrheic keratosis: Secondary | ICD-10-CM

## 2024-08-02 DIAGNOSIS — M546 Pain in thoracic spine: Secondary | ICD-10-CM

## 2024-08-02 DIAGNOSIS — L814 Other melanin hyperpigmentation: Secondary | ICD-10-CM | POA: Diagnosis not present

## 2024-08-02 DIAGNOSIS — L578 Other skin changes due to chronic exposure to nonionizing radiation: Secondary | ICD-10-CM

## 2024-08-02 DIAGNOSIS — D1801 Hemangioma of skin and subcutaneous tissue: Secondary | ICD-10-CM

## 2024-08-02 DIAGNOSIS — D489 Neoplasm of uncertain behavior, unspecified: Secondary | ICD-10-CM

## 2024-08-02 DIAGNOSIS — L570 Actinic keratosis: Secondary | ICD-10-CM

## 2024-08-02 LAB — CBC WITH DIFFERENTIAL/PLATELET
Basophils Absolute: 0 K/uL (ref 0.0–0.1)
Basophils Relative: 0.7 % (ref 0.0–3.0)
Eosinophils Absolute: 0.6 K/uL (ref 0.0–0.7)
Eosinophils Relative: 8.5 % — ABNORMAL HIGH (ref 0.0–5.0)
HCT: 48.9 % (ref 39.0–52.0)
Hemoglobin: 16.1 g/dL (ref 13.0–17.0)
Lymphocytes Relative: 29.3 % (ref 12.0–46.0)
Lymphs Abs: 1.9 K/uL (ref 0.7–4.0)
MCHC: 33 g/dL (ref 30.0–36.0)
MCV: 91.2 fl (ref 78.0–100.0)
Monocytes Absolute: 0.4 K/uL (ref 0.1–1.0)
Monocytes Relative: 6.4 % (ref 3.0–12.0)
Neutro Abs: 3.6 K/uL (ref 1.4–7.7)
Neutrophils Relative %: 55.1 % (ref 43.0–77.0)
Platelets: 197 K/uL (ref 150.0–400.0)
RBC: 5.36 Mil/uL (ref 4.22–5.81)
RDW: 13.3 % (ref 11.5–15.5)
WBC: 6.5 K/uL (ref 4.0–10.5)

## 2024-08-02 LAB — COMPREHENSIVE METABOLIC PANEL WITH GFR
ALT: 19 U/L (ref 0–53)
AST: 19 U/L (ref 0–37)
Albumin: 4.7 g/dL (ref 3.5–5.2)
Alkaline Phosphatase: 56 U/L (ref 39–117)
BUN: 18 mg/dL (ref 6–23)
CO2: 31 meq/L (ref 19–32)
Calcium: 9.6 mg/dL (ref 8.4–10.5)
Chloride: 101 meq/L (ref 96–112)
Creatinine, Ser: 1.01 mg/dL (ref 0.40–1.50)
GFR: 78.11 mL/min (ref >60.00)
Glucose, Bld: 91 mg/dL (ref 70–99)
Potassium: 4.6 meq/L (ref 3.5–5.1)
Sodium: 139 meq/L (ref 135–145)
Total Bilirubin: 0.5 mg/dL (ref 0.2–1.2)
Total Protein: 7.3 g/dL (ref 6.0–8.3)

## 2024-08-02 LAB — BRAIN NATRIURETIC PEPTIDE: Pro B Natriuretic peptide (BNP): 13 pg/mL (ref 0.0–100.0)

## 2024-08-02 NOTE — Progress Notes (Signed)
 Total Body Skin Exam (TBSE) Visit   Subjective  Tyler Obrien is a 63 y.o. male who presents for the following: Skin Cancer Screening and Full Body Skin Exam  Patient presents today for follow up visit for TBSE. Patient was last evaluated on 06/23/23.  Patient denies medication changes. Patient reports he does not have spots, moles and lesions of concern to be evaluated. Patient reports throughout his lifetime he  has had moderate sun exposure.  Currently, patient reports if he  has excessive sun exposure, he does apply sunscreen and/or wears protective coverings. Patient reports he has hx of bx with benign results and with precancerous results. Patient admits to  family history of skin cancers (father). The patient has spots, moles and lesions to be evaluated, some may be new or changing and the patient has concerns that these could be cancer.  The following portions of the chart were reviewed this encounter and updated as appropriate: medications, allergies, medical history  Review of Systems:  No other skin or systemic complaints except as noted in HPI or Assessment and Plan.  Objective  Well appearing patient in no apparent distress; mood and affect are within normal limits.  A full examination was performed including scalp, head, eyes, ears, nose, lips, neck, chest, axillae, abdomen, back, buttocks, bilateral upper extremities, bilateral lower extremities, hands, feet, fingers, toes, fingernails, and toenails. All findings within normal limits unless otherwise noted below.   Relevant physical exam findings are noted in the Assessment and Plan.  Right Lower Back 5mm irregular dark brown macule   Assessment & Plan   LENTIGINES, SEBORRHEIC KERATOSES, HEMANGIOMAS - Benign normal skin lesions - Benign-appearing - Call for any changes  MELANOCYTIC NEVI - Tan-brown and/or pink-flesh-colored symmetric macules and papules - Benign appearing on exam today - Observation - Call clinic  for new or changing moles - Recommend daily use of broad spectrum spf 30+ sunscreen to sun-exposed areas.   ACTINIC DAMAGE - Chronic condition, secondary to cumulative UV/sun exposure - diffuse scaly erythematous macules with underlying dyspigmentation - Recommend daily broad spectrum sunscreen SPF 30+ to sun-exposed areas, reapply every 2 hours as needed.  - Staying in the shade or wearing long sleeves, sun glasses (UVA+UVB protection) and wide brim hats (4-inch brim around the entire circumference of the hat) are also recommended for sun protection.  - Call for new or changing lesions.  SKIN CANCER SCREENING PERFORMED TODAY.   ACTINIC KERATOSIS (7) Dorsum of Nose, Left Forearm - Posterior, Left Preauricular Area, Left Temple, Left Upper Arm - Posterior, Left Upper Cutaneous Lip, Right Temple Related Procedures Destruction of lesion  Destruction method: cryotherapy   Informed consent: discussed and consent obtained   Timeout:  patient name, date of birth, surgical site, and procedure verified Cryotherapy cycles:  11 Outcome: patient tolerated procedure well with no complications   Post-procedure details: wound care instructions given    NEOPLASM OF UNCERTAIN BEHAVIOR Right Lower Back Epidermal / dermal shaving  Lesion diameter (cm):  0.5 Informed consent: discussed and consent obtained   Timeout: patient name, date of birth, surgical site, and procedure verified   Instrument used: DermaBlade   Hemostasis achieved with: aluminum chloride   Outcome: patient tolerated procedure well   Post-procedure details: sterile dressing applied and wound care instructions given   Dressing type: bandage    Specimen A - Surgical pathology Differential Diagnosis: r/o DN  Check Margins: No Return in about 1 year (around 08/02/2025) for FBSE F/U.  I, Lyle Cords,  as acting as a neurosurgeon for [employer], DO .   Documentation: I have reviewed the above documentation for accuracy and  completeness, and I agree with the above.  Delon Lenis, DO

## 2024-08-02 NOTE — Progress Notes (Signed)
 Subjective:    Patient ID: Tyler Obrien, male    DOB: 1961/08/18, 63 y.o.   MRN: [id number]  HPI  Tyler Obrien is a 63 year old male who presents with intermittent right lower thoracic pain and fatigue.  He experiences intermittent pain in the right lower thoracic area, described as being between the ribs and adjacent to t spine. The pain has been present on and off for several months, with an increase in frequency over the past two months. 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 The pain is described as a steady 5 out of 10 at its worst, becoming more noticeable as the day progresses. It is associated with tightness across the back but does not radiate to the front. He takes two 500 mg Tylenol  tablets at bedtime, which helps when lying down, but the pain returns with activity. No cramping, twitching, or spasms in the muscles adjacent to the spine.  He experiences fatigue, particularly in the afternoon, and has a history of anemia and a gastrointestinal bleed, which limits his use of NSAIDs. He has been trying to lose weight and reports not eating in the morning due to lack of appetite. He has a history of cardiac evaluation, including a Zio patch monitor, which showed brief episodes of fast heart rates. Since ziopatch in 2024 his heart rate can spike to 143-158 bpm during activities such as walking or standing on a ladder, as recorded by his Apple Watch. He experiences shortness of breath when squatting and standing up. 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 He has been doing this more frequently since July. His heart rate can remain elevated over 100 bpm during these activities, according to his Apple Watch. He has not been engaging in aerobic exercise, and his heart rate spikes are not  associated with heavy exertion.    Review of Systems  Constitutional:  Positive for fatigue.  Respiratory:  Negative for cough, chest tightness, shortness of breath and wheezing.   Cardiovascular:  Negative for chest pain and palpitations.       See hpi. No current symptoms.  Gastrointestinal:  Negative for abdominal pain, constipation, diarrhea, nausea and vomiting.  Genitourinary:  Negative for dysuria and frequency.  Musculoskeletal:  Negative for back pain and neck pain.       See hpi. Left popliteal pain  Skin:  Negative for rash.  Neurological:  Negative for dizziness.  Hematological:  Negative for adenopathy.  Psychiatric/Behavioral:  Negative for behavioral problems and decreased concentration. The patient is not nervous/anxious.     Past Medical History:  Diagnosis Date   Allergy     Anemia    Arthritis    Bleeding gastric ulcer    Complication of anesthesia    CVA (cerebral infarction) 2011   Diverticulosis    GERD (gastroesophageal reflux disease)    prilosec as needed- upset stomach mostly - started after gastric bleeding ulcer   Head concussion    History of blood transfusion 1992   gi bleed   History of kidney stones    Stroke [facility]) 2011     Social History   Socioeconomic History   Marital status: Married    Spouse name: Not on file   Number of children: 1   Years of education: 60  Highest education level: Bachelor's degree (e.g., BA, AB, BS)  Occupational History   Not on file  Tobacco Use   Smoking status: Never   Smokeless tobacco: Former    Quit date: 1984  Vaping Use   Vaping status: Never Used  Substance and Sexual Activity   Alcohol use: Not Currently   Drug use: No   Sexual activity: Not Currently  Other Topics Concern   Not on file  Social History Narrative   Right handed   Lives with wife   One story home   Currently not working July 7th will resume work   Social Drivers of [employer]: Low Risk   (07/29/2024)   Overall Financial Resource Strain (CARDIA)    Difficulty of Paying Living Expenses: Not very hard  Food Insecurity: No Food Insecurity (07/29/2024)   Hunger Vital Sign    Worried About Running Out of Food in the Last Year: Never true    Ran Out of Food in the Last Year: Never true  Transportation Needs: No Transportation Needs (07/29/2024)   PRAPARE - Administrator, Civil Service (Medical): No    Lack of Transportation (Non-Medical): No  Physical Activity: Sufficiently Active (07/29/2024)   Exercise Vital Sign    Days of Exercise per Week: 5 days    Minutes of Exercise per Session: 30 min  Stress: Stress Concern Present (07/29/2024)   [employer] of Occupational Health - Occupational Stress Questionnaire    Feeling of Stress: Rather much  Social Connections: Socially Integrated (07/29/2024)   Social Connection and Isolation Panel    Frequency of Communication with Friends and Family: More than three times a week    Frequency of Social Gatherings with Friends and Family: Three times a week    Attends Religious Services: More than 4 times per year    Active Member of Clubs or Organizations: Yes    Attends Banker Meetings: More than 4 times per year    Marital Status: Married  Catering Manager Violence: Not At Risk (07/30/2023)   Humiliation, Afraid, Rape, and Kick questionnaire    Fear of Current or Ex-Partner: No    Emotionally Abused: No    Physically Abused: No    Sexually Abused: No    Past Surgical History:  Procedure Laterality Date   APPENDECTOMY     BIOPSY  05/29/2023   Procedure: BIOPSY;  Surgeon: [name], MD;  Location: [facility];  Service: Gastroenterology;;   COLONOSCOPY     10 yrs ago    cyst removal from scrotum     cyst removed from elbow     CYSTOSCOPY/URETEROSCOPY/HOLMIUM LASER/STENT PLACEMENT Left 11/20/2022   Procedure: LEFT URETEROSCOPY/HOLMIUM LASER/STENT PLACEMENT;  Surgeon: [name], MD;  Location: [facility];  Service: Urology;  Laterality: Left;   ESOPHAGOGASTRODUODENOSCOPY N/A 05/29/2023   Procedure: ESOPHAGOGASTRODUODENOSCOPY (EGD);  Surgeon: [name], MD;  Location: [facility];  Service: Gastroenterology;  Laterality: N/A;   EUS N/A 05/29/2023   Procedure: UPPER ENDOSCOPIC ULTRASOUND (EUS) RADIAL;  Surgeon: [name], MD;  Location: [facility];  Service: Gastroenterology;  Laterality: N/A;   FINE NEEDLE ASPIRATION N/A 05/29/2023   Procedure: FINE NEEDLE ASPIRATION (FNA) LINEAR;  Surgeon: [name], MD;  Location: [facility];  Service: Gastroenterology;  Laterality: N/A;   FINGER FRACTURE SURGERY Right    5th finger   KIDNEY STONE SURGERY     LAPAROSCOPIC GASTRECTOMY N/A 07/30/2023   Procedure: LAPAROSCOPIC  PARTIAL GASTRECTOMY;  Surgeon: [name], MD;  Location: [facility];  Service: General;  Laterality: N/A;  HARMONIC SCALPEL ECHELON STAPLER   NASAL SINUS SURGERY  08/2009   OPEN ANTERIOR SHOULDER RECONSTRUCTION     UPPER GASTROINTESTINAL ENDOSCOPY     10 yrs ago    WISDOM TOOTH EXTRACTION      Family History  Problem Relation Age of Onset   Allergic rhinitis Mother    Cancer Mother    Endometrial cancer Mother    Cancer Father        Lung CA   Hyperlipidemia Father    Hypertension Father    Lung cancer Father    Allergic rhinitis Sister    Allergic rhinitis Brother    Stomach cancer Maternal Grandfather    Colon cancer Neg Hx    Colon polyps Neg Hx    Esophageal cancer Neg Hx    Rectal cancer Neg Hx     Allergies  Allergen Reactions   Nsaids Other (See Comments)    BLEEDING/ULCER   Lidocaine -Menthol Rash    Only the patches   Prednisone  Palpitations    Current Outpatient Medications on File Prior to Visit  Medication Sig Dispense Refill   aspirin EC 81 MG tablet Take 81 mg by mouth daily.     azelastine  (ASTELIN ) 0.1 % nasal spray Place 2 sprays into both nostrils 2 (two) times daily.  30 mL 3   b complex vitamins tablet Take 1 tablet by mouth daily.     Coenzyme Q10 (COQ-10 PO) Take 1 capsule by mouth daily.     Collagen Hydrolysate POWD Take 1 Scoop by mouth daily at 6 (six) AM. Collagen powder     diphenhydrAMINE  (BENADRYL ) 25 mg capsule Take 25-50 mg by mouth at bedtime as needed for sleep or allergies.     fexofenadine  (ALLEGRA ) 180 MG tablet Take 1 tablet (180 mg total) by mouth daily. 30 tablet 3   guaiFENesin (MUCINEX) 600 MG 12 hr tablet Take 600 mg by mouth 2 (two) times daily as needed for cough or to loosen phlegm.     Magnesium 250 MG TABS Take 250 mg by mouth daily.     omeprazole  (PRILOSEC) 40 MG capsule Take 1 capsule (40 mg total) by mouth daily. 90 capsule 3   No current facility-administered medications on file prior to visit.    BP 120/78   Pulse 67   Temp 97.6 F (36.4 C) (Oral)   Resp 14   Ht 5' 9 (1.753 m)   Wt 181 lb 6.4 oz (82.3 kg)   SpO2 98%   BMI 26.79 kg/m        Objective:   Physical Exam  General Mental Status- Alert. General Appearance- Not in acute distress.   Skin General: Color- Normal Color. Moisture- Normal Moisture.  Neck Carotid Arteries- Normal color. Moisture- Normal Moisture. No carotid bruits. No JVD.  Chest and Lung Exam Auscultation: Breath Sounds:-CTA  Cardiovascular Auscultation:Rythm- RRR Murmurs & Other Heart Sounds:Auscultation of the heart reveals- No Murmurs.  Abdomen Inspection:-Inspeection Normal. Palpation/Percussion:Note:No mass. Palpation and Percussion of the abdomen reveal- Non Tender, Non Distended + BS, no rebound or guarding.   Neurologic Cranial Nerve exam:- CN III-XII intact(No nystagmus), symmetric smile. Strength:- 5/5 equal and symmetric strength both upper and lower extremities.    Lower ext-  -calfs symmetric, faint + homans sign left side with small popliteal bulge.. No pedal edema.    Assessment & Plan:   Patient Instructions  Right  lower thoracic area pain. Rt side  parathoracic Chronic musculoskeletal pain, exacerbated by activity, relieved by rest. Pain peaks at 5/10. Intermittent since 2011-2015 lifting injury, more prevalent recently. Tylenol  provides relief. - Order thoracic x-ray to evaluate the t spine/rib junction - Continue Tylenol  500 mg as needed for pain management.  Intermittent tachycardia with associated fatigue and intermittent dyspnea Intermittent tachycardia with fatigue and dyspnea. Heart rate spikes to 158 bpm during mild activity. Previous episodes of ventricular tachycardia brief on ziopatch. EKG shows normal sinus rhythm today. Possible low blood volume contributing to increased heart rate. - Order CBC, metabolic panel, iron  level, and BNP to assess for anemia and heart failure. -cxr today - Plan on referring back to  Dr. Krazowski regarding intermittent tachycardia and consider repeat Zio patch monitoring. Want to get lab and review first. - Perform EKG today nsr. No tachycardia.  Left lower extremity pain, popliteal area Pain in the popliteal area. - Order lower extremity ultrasound to evaluate for vascular abnormalities.  Follow up date to be determined after lab and imaging review    I personally spent a total of 50 minutes in the care of the patient today including performing a medically appropriate exam/evaluation, counseling and educating, placing orders, and documenting clinical information in the EHR.   Verdell Dykman, PA-C

## 2024-08-02 NOTE — Patient Instructions (Addendum)

## 2024-08-02 NOTE — Patient Instructions (Addendum)
 Right lower thoracic area pain. Rt side parathoracic Chronic musculoskeletal pain, exacerbated by activity, relieved by rest. Pain peaks at 5/10. Intermittent since 2011-2015 lifting injury, more prevalent recently. Tylenol  provides relief. - Order thoracic x-ray to evaluate the t spine/rib junction - Continue Tylenol  500 mg as needed for pain management.  Intermittent tachycardia with associated fatigue and intermittent dyspnea Intermittent tachycardia with fatigue and dyspnea. Heart rate spikes to 158 bpm during mild activity. Previous episodes of ventricular tachycardia brief on ziopatch. EKG shows normal sinus rhythm today. Possible low blood volume contributing to increased heart rate. - Order CBC, metabolic panel, iron  level, and BNP to assess for anemia and heart failure. -cxr today - Plan on referring back to  Dr. Krazowski regarding intermittent tachycardia and consider repeat Zio patch monitoring. Want to get lab and review first. - Perform EKG today nsr. No tachycardia.  Left lower extremity pain, popliteal area Pain in the popliteal area. - Order lower extremity ultrasound to evaluate for vascular abnormalities.  Follow up date to be determined after lab and imaging review

## 2024-08-03 LAB — IRON,TIBC AND FERRITIN PANEL
%SAT: 46 % (ref 20–48)
Ferritin: 151 ng/mL (ref 24–380)
Iron: 129 ug/dL (ref 50–180)
TIBC: 281 ug/dL (ref 250–425)

## 2024-08-03 NOTE — Addendum Note (Signed)
 Addended by: DORINA DALLAS HERO on: 08/03/2024 12:44 PM   Modules accepted: Orders

## 2024-08-04 LAB — SURGICAL PATHOLOGY

## 2024-08-05 ENCOUNTER — Ambulatory Visit: Payer: Self-pay | Admitting: Dermatology

## 2024-08-05 ENCOUNTER — Encounter: Payer: Self-pay | Admitting: Dermatology

## 2024-08-05 ENCOUNTER — Ambulatory Visit (INDEPENDENT_AMBULATORY_CARE_PROVIDER_SITE_OTHER): Admitting: Family Medicine

## 2024-08-05 ENCOUNTER — Encounter: Payer: Self-pay | Admitting: Family Medicine

## 2024-08-05 VITALS — BP 112/76 | HR 106 | Ht 69.0 in | Wt 181.0 lb

## 2024-08-05 DIAGNOSIS — G2589 Other specified extrapyramidal and movement disorders: Secondary | ICD-10-CM

## 2024-08-05 DIAGNOSIS — D239 Other benign neoplasm of skin, unspecified: Secondary | ICD-10-CM | POA: Insufficient documentation

## 2024-08-05 NOTE — Progress Notes (Signed)
 [name] [address] [place] [address] Phone: [phone] Subjective:   Tyler Obrien, am serving as a scribe for Dr. Arthea Claudene.  I'm seeing this patient by the request  of:  [name], [place], PA-C  CC: Chronic right sided low back pain  YEP:Dlagzrupcz  Tyler Obrien is a 63 y.o. male coming in with complaint of chronic thoracic right pain.  Past medical history is significant for kidney stones.  Past medical history also significant for a CVA. Years ago he felt a pop in the right side of ribs in thoracic spine. Standing and driving increases his pain. Rotation increases his pain. Stretches and uses Tylenol  prn. Does have pain today.   Also has pain in the sternum near R intercostals. Painful to take deep breaths but after a couple of breaths this pain goes away but it will come back.   Hx of L shoulder reconstruction.      Patient did have thoracic x-rays taken by primary care provider.  X-rays were independently visualized by me and I see no significant arthritic changes.  Patient is also had a CT of the abdomen pelvis with contrast recently.  Nonobstructing bilateral renal stones noted as well as prior partial gastrectomy  Past Medical History:  Diagnosis Date   Allergy     Anemia    Arthritis    Bleeding gastric ulcer    Complication of anesthesia    CVA (cerebral infarction) 2011   Diverticulosis    Dysplastic nevus 08/05/2024   right lower back - severe atypia - Ex needed    GERD (gastroesophageal reflux disease)    prilosec as needed- upset stomach mostly - started after gastric bleeding ulcer   Head concussion    History of blood transfusion 1992   gi bleed   History of kidney stones    Stroke [facility]) 2011   Past Surgical History:  Procedure Laterality Date   APPENDECTOMY     BIOPSY  05/29/2023   Procedure: BIOPSY;  Surgeon: [name], MD;  Location: [facility];  Service: Gastroenterology;;    COLONOSCOPY     10 yrs ago    cyst removal from scrotum     cyst removed from elbow     CYSTOSCOPY/URETEROSCOPY/HOLMIUM LASER/STENT PLACEMENT Left 11/20/2022   Procedure: LEFT URETEROSCOPY/HOLMIUM LASER/STENT PLACEMENT;  Surgeon: [name], MD;  Location: [facility];  Service: Urology;  Laterality: Left;   ESOPHAGOGASTRODUODENOSCOPY N/A 05/29/2023   Procedure: ESOPHAGOGASTRODUODENOSCOPY (EGD);  Surgeon: [name], MD;  Location: [facility];  Service: Gastroenterology;  Laterality: N/A;   EUS N/A 05/29/2023   Procedure: UPPER ENDOSCOPIC ULTRASOUND (EUS) RADIAL;  Surgeon: [name], MD;  Location: [facility];  Service: Gastroenterology;  Laterality: N/A;   FINE NEEDLE ASPIRATION N/A 05/29/2023   Procedure: FINE NEEDLE ASPIRATION (FNA) LINEAR;  Surgeon: [name], MD;  Location: [facility];  Service: Gastroenterology;  Laterality: N/A;   FINGER FRACTURE SURGERY Right    5th finger   KIDNEY STONE SURGERY     LAPAROSCOPIC GASTRECTOMY N/A 07/30/2023   Procedure: LAPAROSCOPIC PARTIAL GASTRECTOMY;  Surgeon: [name], MD;  Location: [facility];  Service: General;  Laterality: N/A;  HARMONIC SCALPEL ECHELON STAPLER   NASAL SINUS SURGERY  08/2009   OPEN ANTERIOR SHOULDER RECONSTRUCTION     UPPER GASTROINTESTINAL ENDOSCOPY     10 yrs ago    WISDOM TOOTH EXTRACTION     Social History   Socioeconomic History   Marital  status: Married    Spouse name: Not on file   Number of children: 1   Years of education: 16   Highest education level: Bachelor's degree (e.g., BA, AB, BS)  Occupational History   Not on file  Tobacco Use   Smoking status: Never   Smokeless tobacco: Former    Quit date: 1984  Vaping Use   Vaping status: Never Used  Substance and Sexual Activity   Alcohol use: Not Currently   Drug use: No   Sexual activity: Not Currently  Other Topics Concern   Not on file  Social History Narrative   Right handed   Lives with wife    One story home   Currently not working July 7th will resume work   Social Drivers of [employer]: Low Risk  (07/29/2024)   Overall Financial Resource Strain (CARDIA)    Difficulty of Paying Living Expenses: Not very hard  Food Insecurity: No Food Insecurity (07/29/2024)   Hunger Vital Sign    Worried About Running Out of Food in the Last Year: Never true    Ran Out of Food in the Last Year: Never true  Transportation Needs: No Transportation Needs (07/29/2024)   PRAPARE - Administrator, Civil Service (Medical): No    Lack of Transportation (Non-Medical): No  Physical Activity: Sufficiently Active (07/29/2024)   Exercise Vital Sign    Days of Exercise per Week: 5 days    Minutes of Exercise per Session: 30 min  Stress: Stress Concern Present (07/29/2024)   [employer] of Occupational Health - Occupational Stress Questionnaire    Feeling of Stress: Rather much  Social Connections: Socially Integrated (07/29/2024)   Social Connection and Isolation Panel    Frequency of Communication with Friends and Family: More than three times a week    Frequency of Social Gatherings with Friends and Family: Three times a week    Attends Religious Services: More than 4 times per year    Active Member of Clubs or Organizations: Yes    Attends Engineer, Structural: More than 4 times per year    Marital Status: Married   Allergies  Allergen Reactions   Nsaids Other (See Comments)    BLEEDING/ULCER   Lidocaine -Menthol Rash    Only the patches   Prednisone  Palpitations   Family History  Problem Relation Age of Onset   Allergic rhinitis Mother    Cancer Mother    Endometrial cancer Mother    Cancer Father        Lung CA   Hyperlipidemia Father    Hypertension Father    Lung cancer Father    Allergic rhinitis Sister    Allergic rhinitis Brother    Stomach cancer Maternal Grandfather    Colon cancer Neg Hx    Colon polyps Neg Hx     Esophageal cancer Neg Hx    Rectal cancer Neg Hx       Current Outpatient Medications (Respiratory):    azelastine  (ASTELIN ) 0.1 % nasal spray, Place 2 sprays into both nostrils 2 (two) times daily.   diphenhydrAMINE  (BENADRYL ) 25 mg capsule, Take 25-50 mg by mouth at bedtime as needed for sleep or allergies.   fexofenadine  (ALLEGRA ) 180 MG tablet, Take 1 tablet (180 mg total) by mouth daily.   guaiFENesin (MUCINEX) 600 MG 12 hr tablet, Take 600 mg by mouth 2 (two) times daily as needed for cough or to loosen phlegm.  Current Outpatient Medications (  Analgesics):    aspirin EC 81 MG tablet, Take 81 mg by mouth daily.   Current Outpatient Medications (Other):    b complex vitamins tablet, Take 1 tablet by mouth daily.   Coenzyme Q10 (COQ-10 PO), Take 1 capsule by mouth daily.   Collagen Hydrolysate POWD, Take 1 Scoop by mouth daily at 6 (six) AM. Collagen powder   Magnesium 250 MG TABS, Take 250 mg by mouth daily.   omeprazole  (PRILOSEC) 40 MG capsule, Take 1 capsule (40 mg total) by mouth daily.   Reviewed prior external information including notes and imaging from  primary care provider As well as notes that were available from care everywhere and other healthcare systems.  Past medical history, social, surgical and family history all reviewed in electronic medical record.  No pertanent information unless stated regarding to the chief complaint.   Review of Systems:  No headache, visual changes, nausea, vomiting, diarrhea, constipation, dizziness, abdominal pain, skin rash, fevers, chills, night sweats, weight loss, swollen lymph nodes, body aches, joint swelling, chest pain, shortness of breath, mood changes. POSITIVE muscle aches  Objective  Blood pressure 112/76, pulse (!) 106, height 5' 9 (1.753 m), weight 181 lb (82.1 kg), SpO2 94%.   General: No apparent distress alert and oriented x3 mood and affect normal, dressed appropriately.  HEENT: Pupils equal, extraocular  movements intact  Respiratory: Patient's speak in full sentences and does not appear short of breath  Cardiovascular: No lower extremity edema, non tender, no erythema   Right shoulder exam shows relatively good range of motion.  Unfortunately scapular dyskinesis and mild winging noted of the inferior aspect of the scapula noted.  5 out of 5 strength of the upper extremity otherwise.  [id number]; 15 additional minutes spent for Therapeutic exercises as stated in above notes.  This included exercises focusing on stretching, strengthening, with significant focus on eccentric aspects.   Long term goals include an improvement in range of motion, strength, endurance as well as avoiding reinjury. Patient's frequency would include in 1-2 times a day, 3-5 times a week for a duration of 6-12 weeks. Exercises that included:  Basic scapular stabilization to include adduction and depression of scapula Scaption, focusing on proper movement and good control Internal and External rotation utilizing a theraband, with elbow tucked at side entire time Rows with theraband   Proper technique shown and discussed handout in great detail with ATC.  All questions were discussed and answered.      Impression and Recommendations:     The above documentation has been reviewed and is accurate and complete Tyler Zuercher M Kattia Selley, DO

## 2024-08-05 NOTE — Assessment & Plan Note (Signed)
 Scapular dyskinesis noted, patient has had degenerative disc disease and I am concerned that there is a possibility increase activity slowly.  Given home exercises.  I do think that potentially patient's neck with an injury previously could have contributed to some of the winging aspect and may take more time at the moment.  Follow-up again in 6 to 8 weeks

## 2024-08-05 NOTE — Progress Notes (Signed)
 Hi Shirron  Please call pt and notify that their bx results showed an abnormal mole that requires a full excision in office with Dr Corey        1. Skin, right lower back :       JUNCTIONAL DYSPLASTIC MELANOCYTIC NEVUS WITH MODERATE TO SEVERE ATYPIA, LIMITED       MARGINS FREE, SEE DESCRIPTION

## 2024-08-05 NOTE — Patient Instructions (Signed)
 Do prescribed exercises at least 3x a week Keep hands in peripheral vision Keep working on ergonomics, monitors at eye level See you again in 2 months

## 2024-08-09 NOTE — Progress Notes (Signed)
 Assessment/Plan:   Cognitive concerns  Tyler Obrien is a very pleasant 63 y.o. 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  He is seen today in follow up to discuss his recent neuropsychological evaluation from 05/13/2024.  His results, personally reviewed indicate normal cognitive functioning, actually with several scores exceeding expectations.  That along with unremarkable MRI of the brain in July 2025, concludes that there is no evidence of neurocognitive disorder at this time.  His overall profile shows healthy cognitive aging with well-preserved functional abilities.  His subjective memory concerns are likely multifactorial secondary to normal aging, recent health stressors, persistent fatigue and regular Benadryl  use until recently.  There will be no indication for any other testing, or antidementia medication given the above results.   No follow-up is indicated Continue to control mood as per PCP, consider psychotherapy for anxiety Recommend good control of cardiovascular risk factors Replenish B1, follow-up with primary care physician Recommend no further Benadryl  as recommended by neuropsychologist   Neuropsych evaluation 725, Dr. Gayland briefly, results indicated normal cognitive functioning, with several scores notably exceeding expectations. He does not show evidence of a neurocognitive disorder at this time. His overall profile reflects healthy cognitive aging and well-preserved functional abilities. Subjective cognitive concerns are likely multifactorial, related to normal aging, several recent health stressors, persistent fatigue, and regular Benadryl  use until recently.    MRI of the brain 04/2024, personally reviewed, without evidence of acute intracranial  abnormality, no age advanced or lobar predominant cerebral atrophy, minor chronic small vessel ischemic changes within the cerebral white matter  Initial visit 04/01/2024 How long did patient have memory difficulties?  For the last year although after being laid off in January, memory was worse not remembering names and appeared attention.  Reports some difficulty remembering new information, conversations. Not so much with names, never paid much attention to it.  Long-term memory is good. Reads extensively. repeats oneself?  Not as much Disoriented when walking into a room?  Patient denies   Leaving objects in unusual places? Denies.  Wandering behavior?  Denies. Any personality changes?  When I was taking Flonase , I was shaking and made me edgy but sinceI stopped , I am  feeling much better   Any history of depression?:  Denies   Hallucinations or paranoia? Denies  Seizures?  Denies    Any sleep changes?   Sleeps  better since of  Flonase .  He denies vivid dreams, REM behavior or sleepwalking   Sleep apnea?  May be partially, he had a study in the past but I did not need to use the CPAP.    Any hygiene concerns?  Denies   Independent of bathing and dressing?  Endorsed  Does the patient needs help with medications? Patient is in charge.  If busy I might forget  Who is in charge of the finances? Patient is in charge. Getting more organized.      Any changes in appetite? I had a partial resection of the stomach last Fall and it is different, sometimes I don't want to eat .     Patient have trouble swallowing? Denies.   Does the patient cook? Yes, denies forgetting common recipes   Any kitchen accidents such as leaving the stove on? Denies.   Any history  of headaches?   Denies.   Chronic pain ? Denies.   Ambulates with difficulty?  Denies Recent falls or head injuries?  I played football and got knocked out plenty times    Vision changes? Denies.   Any stroke like symptoms? In  2011 I was getting back from the bathroom and for 5 hrs I was asking repetitive questions, LF lobe had a small stroke with some residual STM memory loss through 2012. Gradually the memory came back.   Any tremors?   Denies.   Any anosmia?  Denies.   Any incontinence of urine? Denies.HE had recurrent kidney stones, last in 10/2022 Any bowel dysfunction? Once in a while I get constipation since the surgery     Patient lives with wife   History of heavy alcohol intake? Quit heavy ETOH this year.  History of heavy tobacco use? Denies  Family history of dementia? Fa had dementia? type, PGM dementia ?AD Does patient drive? Yes, denies getting lost    CURRENT MEDICATIONS:  Outpatient Encounter Medications as of 08/11/2024  Medication Sig   aspirin EC 81 MG tablet Take 81 mg by mouth daily.   azelastine  (ASTELIN ) 0.1 % nasal spray Place 2 sprays into both nostrils 2 (two) times daily.   b complex vitamins tablet Take 1 tablet by mouth daily.   Coenzyme Q10 (COQ-10 PO) Take 1 capsule by mouth daily.   Collagen Hydrolysate POWD Take 1 Scoop by mouth daily at 6 (six) AM. Collagen powder   diphenhydrAMINE  (BENADRYL ) 25 mg capsule Take 25-50 mg by mouth at bedtime as needed for sleep or allergies.   fexofenadine  (ALLEGRA ) 180 MG tablet Take 1 tablet (180 mg total) by mouth daily.   guaiFENesin (MUCINEX) 600 MG 12 hr tablet Take 600 mg by mouth 2 (two) times daily as needed for cough or to loosen phlegm.   Magnesium 250 MG TABS Take 250 mg by mouth daily.   omeprazole  (PRILOSEC) 40 MG capsule Take 1 capsule (40 mg total) by mouth daily.   No facility-administered encounter medications on file as of 08/11/2024.        No data to display            04/01/2024    4:00 PM  Montreal Cognitive Assessment   Visuospatial/ Executive (0/5) 4  Naming (0/3) 3  Attention: Read list of digits (0/2) 2  Attention: Read list of letters (0/1) 1  Attention: Serial 7 subtraction starting at 100 (0/3) 3   Language: Repeat phrase (0/2) 2  Language : Fluency (0/1) 1  Abstraction (0/2) 2  Delayed Recall (0/5) 5  Orientation (0/6) 6  Total 29  Adjusted Score (based on education) 29   Thank you for allowing us  the opportunity to participate in the care of this nice patient. Please do not hesitate to contact us  for any questions or concerns.   Total time spent on today's visit was *** minutes dedicated to this patient today, preparing to see patient, examining the patient, ordering tests and/or medications and counseling the patient, documenting clinical information in the EHR or other health record, independently interpreting results and communicating results to the patient/family, discussing treatment and goals, answering patient's questions and coordinating care.  Cc:  [name], Edward, PA-C  [facility] 08/09/2024 5:53 AM

## 2024-08-11 ENCOUNTER — Encounter: Payer: Self-pay | Admitting: Physician Assistant

## 2024-08-11 ENCOUNTER — Ambulatory Visit (INDEPENDENT_AMBULATORY_CARE_PROVIDER_SITE_OTHER): Admitting: Physician Assistant

## 2024-08-11 VITALS — BP 124/84 | HR 78 | Resp 20 | Wt 180.0 lb

## 2024-08-11 DIAGNOSIS — R419 Unspecified symptoms and signs involving cognitive functions and awareness: Secondary | ICD-10-CM | POA: Diagnosis not present

## 2024-08-13 ENCOUNTER — Telehealth: Payer: Self-pay

## 2024-08-13 NOTE — Telephone Encounter (Signed)
 Patient returned my call and I scheduled him for EGD with Dr. Albertus on 10/05/24 at 12:30pm and pre-visit for 09/21/24 at 8:00am.

## 2024-08-13 NOTE — Telephone Encounter (Signed)
 Left message for patient to return my call.

## 2024-08-13 NOTE — Telephone Encounter (Signed)
-----   Message from Gordy HERO Pyrtle sent at 08/13/2024 12:50 PM EST ----- Sure! Thanks Gordy Palma Pt needs EGD in [facility] for hx GIST, early satiety, epig pain JMP ----- Message ----- From: [name], MD Sent: 08/13/2024  11:05 AM EST To: Gordy HERO Starch, MD  This guy is s/p resection of gist on lesser curve.  He is having some issues like feeling like his stomach won't hold much and like things sometimes get caught. Do you think you could get him in for endo?  Tx FB

## 2024-09-08 ENCOUNTER — Telehealth: Admitting: Physician Assistant

## 2024-09-08 ENCOUNTER — Encounter: Payer: Self-pay | Admitting: Family

## 2024-09-08 DIAGNOSIS — J019 Acute sinusitis, unspecified: Secondary | ICD-10-CM

## 2024-09-08 DIAGNOSIS — B9689 Other specified bacterial agents as the cause of diseases classified elsewhere: Secondary | ICD-10-CM | POA: Diagnosis not present

## 2024-09-08 MED ORDER — DOXYCYCLINE HYCLATE 100 MG PO TABS: 100.0000 mg | ORAL_TABLET | Freq: Two times a day (BID) | ORAL | 0 refills | Status: DC

## 2024-09-08 NOTE — Progress Notes (Signed)
 Virtual Visit Consent   Tyler Obrien, you are scheduled for a virtual visit with a [facility] provider today. Just as with appointments in the office, your consent must be obtained to participate. Your consent will be active for this visit and any virtual visit you may have with one of our providers in the next 365 days. If you have a MyChart account, a copy of this consent can be sent to you electronically.  As this is a virtual visit, video technology does not allow for your provider to perform a traditional examination. This may limit your provider's ability to fully assess your condition. If your provider identifies any concerns that need to be evaluated in person or the need to arrange testing (such as labs, EKG, etc.), we will make arrangements to do so. Although advances in technology are sophisticated, we cannot ensure that it will always work on either your end or our end. If the connection with a video visit is poor, the visit may have to be switched to a telephone visit. With either a video or telephone visit, we are not always able to ensure that we have a secure connection.  By engaging in this virtual visit, you consent to the provision of healthcare and authorize for your insurance to be billed (if applicable) for the services provided during this visit. Depending on your insurance coverage, you may receive a charge related to this service.  I need to obtain your verbal consent now. Are you willing to proceed with your visit today? [name] has provided verbal consent on 09/08/2024 for a virtual visit (video or telephone). Tyler Obrien, [place]  Date: 09/08/2024 11:09 AM   Virtual Visit via Video Note   I, Tyler Obrien, connected with  [name]  ([id number], [birth year]) on 09/08/24 at 11:00 AM EST by a video-enabled telemedicine application and verified that I am speaking with the correct person using two identifiers.  Location: Patient: Virtual Visit  Location Patient: Home Provider: Virtual Visit Location Provider: Home Office   I discussed the limitations of evaluation and management by telemedicine and the availability of in person appointments. The patient expressed understanding and agreed to proceed.    History of Present Illness: Tyler Obrien is a 63 y.o. who identifies as a male who was assigned male at birth, and is being seen today for possible sinus infection. Symptoms last week with nasal congestion, sinus pressure and L maxillary sided sinus pain associated with sinus headache and tooth pain. Denies chills. Some aches initially.  Denies recent travel or sick contact.  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) 07/30/2023   History of kidney stones 07/11/2023   History of CVA (cerebrovascular accident) without residual deficits 07/11/2023   Family history of cancer 07/11/2023   Coronary artery disease minimal disease based on coronary scheduled from 2024 05/13/2023   IDA (iron  deficiency anemia) 04/04/2023   Palpitations 02/17/2023   Atypical chest pain 02/17/2023   Dyspnea on exertion 02/17/2023   Dyslipidemia 02/17/2023   Late effect of cerebrovascular accident (CVA) 02/17/2023   DDD (degenerative disc disease), cervical 03/24/2019   Right shoulder pain 02/24/2019   Cyst of scrotum 08/27/2016   Bladder pain 08/27/2016   Microcytosis 08/27/2016  Vitamin D  deficiency 08/27/2016   Ethmoid sinusitis 08/27/2016   Kidney stone 08/21/2015    Allergies:  Allergies  Allergen Reactions   Nsaids Other (See Comments)    BLEEDING/ULCER   Lidocaine -Menthol Rash    Only the patches   Prednisone  Palpitations   Medications:  Current Outpatient Medications:    doxycycline  (VIBRA -TABS)  100 MG tablet, Take 1 tablet (100 mg total) by mouth 2 (two) times daily., Disp: 20 tablet, Rfl: 0   aspirin EC 81 MG tablet, Take 81 mg by mouth daily., Disp: , Rfl:    azelastine  (ASTELIN ) 0.1 % nasal spray, Place 2 sprays into both nostrils 2 (two) times daily., Disp: 30 mL, Rfl: 3   b complex vitamins tablet, Take 1 tablet by mouth daily., Disp: , Rfl:    Coenzyme Q10 (COQ-10 PO), Take 1 capsule by mouth daily., Disp: , Rfl:    Collagen Hydrolysate POWD, Take 1 Scoop by mouth daily at 6 (six) AM. Collagen powder, Disp: , Rfl:    diphenhydrAMINE  (BENADRYL ) 25 mg capsule, Take 25-50 mg by mouth at bedtime as needed for sleep or allergies., Disp: , Rfl:    fexofenadine  (ALLEGRA ) 180 MG tablet, Take 1 tablet (180 mg total) by mouth daily., Disp: 30 tablet, Rfl: 3   guaiFENesin (MUCINEX) 600 MG 12 hr tablet, Take 600 mg by mouth 2 (two) times daily as needed for cough or to loosen phlegm., Disp: , Rfl:    Magnesium 250 MG TABS, Take 250 mg by mouth daily., Disp: , Rfl:    omeprazole  (PRILOSEC) 40 MG capsule, Take 1 capsule (40 mg total) by mouth daily., Disp: 90 capsule, Rfl: 3  Observations/Objective: Patient is well-developed, well-nourished in no acute distress.  Resting comfortably at home.  Head is normocephalic, atraumatic.  No labored breathing.  Speech is clear and coherent with logical content.  Patient is alert and oriented at baseline.   Assessment and Plan: 1. Acute bacterial sinusitis (Primary) - doxycycline  (VIBRA -TABS) 100 MG tablet; Take 1 tablet (100 mg total) by mouth 2 (two) times daily.  Dispense: 20 tablet; Refill: 0  Rx Doxycycline .  Increase fluids.  Rest.  Saline nasal spray.  Probiotic.  Mucinex as directed.  Humidifier in bedroom. Restart Astelin  nasal spray in place of Afrin.  Call or return to clinic if symptoms are not improving.   Follow Up Instructions: I discussed the assessment and treatment plan with the patient. The patient was provided an opportunity to  ask questions and all were answered. The patient agreed with the plan and demonstrated an understanding of the instructions.  A copy of instructions were sent to the patient via MyChart unless otherwise noted below.   The patient was advised to call back or seek an in-person evaluation if the symptoms worsen or if the condition fails to improve as anticipated.    Tyler Velma Lunger, PA-C

## 2024-09-08 NOTE — Patient Instructions (Signed)
 Tyler Obrien, thank you for joining [name], PA-C for today's virtual visit.  While this provider is not your primary care provider (PCP), if your PCP is located in our provider database this encounter information will be shared with them immediately following your visit.   A [facility] MyChart account gives you access to today's visit and all your visits, tests, and labs performed at [facility]  click here if you don't have a [place] MyChart account or go to [email or web address]  Consent: (Patient) Tyler Obrien provided verbal consent for this virtual visit at the beginning of the encounter.  Current Medications:  Current Outpatient Medications:    aspirin EC 81 MG tablet, Take 81 mg by mouth daily., Disp: , Rfl:    azelastine  (ASTELIN ) 0.1 % nasal spray, Place 2 sprays into both nostrils 2 (two) times daily., Disp: 30 mL, Rfl: 3   b complex vitamins tablet, Take 1 tablet by mouth daily., Disp: , Rfl:    Coenzyme Q10 (COQ-10 PO), Take 1 capsule by mouth daily., Disp: , Rfl:    Collagen Hydrolysate POWD, Take 1 Scoop by mouth daily at 6 (six) AM. Collagen powder, Disp: , Rfl:    diphenhydrAMINE  (BENADRYL ) 25 mg capsule, Take 25-50 mg by mouth at bedtime as needed for sleep or allergies., Disp: , Rfl:    fexofenadine  (ALLEGRA ) 180 MG tablet, Take 1 tablet (180 mg total) by mouth daily., Disp: 30 tablet, Rfl: 3   guaiFENesin (MUCINEX) 600 MG 12 hr tablet, Take 600 mg by mouth 2 (two) times daily as needed for cough or to loosen phlegm., Disp: , Rfl:    Magnesium 250 MG TABS, Take 250 mg by mouth daily., Disp: , Rfl:    omeprazole  (PRILOSEC) 40 MG capsule, Take 1 capsule (40 mg total) by mouth daily., Disp: 90 capsule, Rfl: 3   Medications ordered in this encounter:  No orders of the defined types were placed in this encounter.    *If you need refills on other medications prior to your next appointment, please contact your  pharmacy*  Follow-Up: Call back or seek an in-person evaluation if the symptoms worsen or if the condition fails to improve as anticipated.  [facility] [phone]  Other Instructions Please take antibiotic as directed.  Increase fluid intake.  Use Saline nasal spray.  Take a daily multivitamin. Stop the Afrin. Restart your Astelin  nasal spray.  Place a humidifier in the bedroom.  Please call or return clinic if symptoms are not improving.  Sinusitis Sinusitis is redness, soreness, and swelling (inflammation) of the paranasal sinuses. Paranasal sinuses are air pockets within the bones of your face (beneath the eyes, the middle of the forehead, or above the eyes). In healthy paranasal sinuses, mucus is able to drain out, and air is able to circulate through them by way of your nose. However, when your paranasal sinuses are inflamed, mucus and air can become trapped. This can allow bacteria and other germs to grow and cause infection. Sinusitis can develop quickly and last only a short time (acute) or continue over a long period (chronic). Sinusitis that lasts for more than 12 weeks is considered chronic.  CAUSES  Causes of sinusitis include: Allergies. Structural abnormalities, such as displacement of the cartilage that separates your nostrils (deviated septum), which can decrease the air flow through your nose and sinuses and affect sinus drainage. Functional abnormalities, such as when the small hairs (cilia) that line your sinuses and help  remove mucus do not work properly or are not present. SYMPTOMS  Symptoms of acute and chronic sinusitis are the same. The primary symptoms are pain and pressure around the affected sinuses. Other symptoms include: Upper toothache. Earache. Headache. Bad breath. Decreased sense of smell and taste. A cough, which worsens when you are lying flat. Fatigue. Fever. Thick drainage from your nose, which often is green and may contain pus  (purulent). Swelling and warmth over the affected sinuses. DIAGNOSIS  Your caregiver will perform a physical exam. During the exam, your caregiver may: Look in your nose for signs of abnormal growths in your nostrils (nasal polyps). Tap over the affected sinus to check for signs of infection. View the inside of your sinuses (endoscopy) with a special imaging device with a light attached (endoscope), which is inserted into your sinuses. If your caregiver suspects that you have chronic sinusitis, one or more of the following tests may be recommended: Allergy  tests. Nasal culture A sample of mucus is taken from your nose and sent to a lab and screened for bacteria. Nasal cytology A sample of mucus is taken from your nose and examined by your caregiver to determine if your sinusitis is related to an allergy . TREATMENT  Most cases of acute sinusitis are related to a viral infection and will resolve on their own within 10 days. Sometimes medicines are prescribed to help relieve symptoms (pain medicine, decongestants, nasal steroid sprays, or saline sprays).  However, for sinusitis related to a bacterial infection, your caregiver will prescribe antibiotic medicines. These are medicines that will help kill the bacteria causing the infection.  Rarely, sinusitis is caused by a fungal infection. In theses cases, your caregiver will prescribe antifungal medicine. For some cases of chronic sinusitis, surgery is needed. Generally, these are cases in which sinusitis recurs more than 3 times per year, despite other treatments. HOME CARE INSTRUCTIONS  Drink plenty of water. Water helps thin the mucus so your sinuses can drain more easily. Use a humidifier. Inhale steam 3 to 4 times a day (for example, sit in the bathroom with the shower running). Apply a warm, moist washcloth to your face 3 to 4 times a day, or as directed by your caregiver. Use saline nasal sprays to help moisten and clean your sinuses. Take  over-the-counter or prescription medicines for pain, discomfort, or fever only as directed by your caregiver. SEEK IMMEDIATE MEDICAL CARE IF: You have increasing pain or severe headaches. You have nausea, vomiting, or drowsiness. You have swelling around your face. You have vision problems. You have a stiff neck. You have difficulty breathing. MAKE SURE YOU:  Understand these instructions. Will watch your condition. Will get help right away if you are not doing well or get worse. Document Released: 09/23/2005 Document Revised: 12/16/2011 Document Reviewed: 10/08/2011 [facility] Patient Information 2014 [place], [place].    If you have been instructed to have an in-person evaluation today at a local Urgent Care facility, please use the link below. It will take you to a list of all of our available [place] [facility], including address, phone number and hours of operation. Please do not delay care.  [facility]  If you or a family member do not have a primary care provider, use the link below to schedule a visit and establish care. When you choose a [facility] primary care physician or advanced practice provider, you gain a long-term partner in health. Find a Primary Care Provider  Learn more about [facility]'s in-office and virtual care options: [place] - Get Care Now

## 2024-09-20 ENCOUNTER — Encounter: Payer: Self-pay | Admitting: Family

## 2024-09-21 ENCOUNTER — Ambulatory Visit

## 2024-09-21 VITALS — Ht 69.0 in | Wt 175.0 lb

## 2024-09-21 DIAGNOSIS — R6881 Early satiety: Secondary | ICD-10-CM

## 2024-09-21 DIAGNOSIS — C49A2 Gastrointestinal stromal tumor of stomach: Secondary | ICD-10-CM

## 2024-09-21 NOTE — Progress Notes (Signed)
 No issues known to pt with past sedation with any surgeries or procedures Patient denies ever being told they had issues or difficulty with intubation  No FH of Malignant Hyperthermia Pt is not on diet pills nor GLP-1 medications Pt is not on home 02  Pt is not on blood thinners  Pt denies issues with chronic constipation  No A fib or A flutter Have any cardiac testing pending--no Ambulates independently

## 2024-10-04 NOTE — Progress Notes (Unsigned)
 " [facility] [address] [place] [address] Phone: [phone] Subjective:   LILLETTE Berwyn Posey, am serving as a scribe for Dr. Arthea Claudene.  I'm seeing this patient by the request  of:  [name], [place], PA-C  CC: Right sided thoracic pain follow-up  Tyler Obrien  Tyler Obrien is a 63 y.o. male coming in with complaint of R sided thoracic spine pain.  Found to have more of a scapular dyskinesis and given home exercises.  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  Patient states that his pain is intermittent with possible improvement.    Xray thoracic spine 08/02/2024 IMPRESSION: 1. No acute abnormality of the thoracic spine.    Past Medical History:  Diagnosis Date   Allergy     Anemia    Arthritis    Bleeding gastric ulcer    Complication of anesthesia    CVA (cerebral infarction) 2011   Diverticulosis    Dysplastic nevus 08/05/2024   right lower back - severe atypia - Ex needed    GERD (gastroesophageal reflux disease)    prilosec as needed- upset stomach mostly - started after gastric bleeding ulcer   Head concussion    History of blood transfusion 1992   gi bleed   History of kidney stones    Stroke [facility]) 2011   Past Surgical History:  Procedure Laterality Date   APPENDECTOMY     BIOPSY  05/29/2023   Procedure: BIOPSY;  Surgeon: [name], MD;  Location: [facility];  Service: Gastroenterology;;   COLONOSCOPY     10 yrs ago    cyst removal from scrotum     cyst removed from elbow     CYSTOSCOPY/URETEROSCOPY/HOLMIUM LASER/STENT PLACEMENT Left 11/20/2022   Procedure: LEFT URETEROSCOPY/HOLMIUM LASER/STENT PLACEMENT;  Surgeon: [name], MD;  Location: [facility];  Service: Urology;  Laterality: Left;   ESOPHAGOGASTRODUODENOSCOPY N/A 05/29/2023   Procedure: ESOPHAGOGASTRODUODENOSCOPY (EGD);  Surgeon: [name], MD;  Location: [facility];  Service: Gastroenterology;  Laterality: N/A;   EUS N/A 05/29/2023   Procedure: UPPER ENDOSCOPIC ULTRASOUND (EUS) RADIAL;  Surgeon: [name], MD;  Location: [facility];  Service: Gastroenterology;  Laterality: N/A;   FINE NEEDLE ASPIRATION N/A 05/29/2023   Procedure: FINE NEEDLE ASPIRATION (FNA) LINEAR;  Surgeon: [name], MD;  Location: [facility];  Service: Gastroenterology;  Laterality: N/A;   FINGER FRACTURE SURGERY Right    5th finger   KIDNEY STONE SURGERY     LAPAROSCOPIC GASTRECTOMY N/A 07/30/2023   Procedure: LAPAROSCOPIC PARTIAL GASTRECTOMY;  Surgeon: [name], MD;  Location: [facility];  Service: General;  Laterality: N/A;  HARMONIC SCALPEL ECHELON STAPLER   NASAL SINUS SURGERY  08/2009   OPEN ANTERIOR SHOULDER RECONSTRUCTION     UPPER GASTROINTESTINAL ENDOSCOPY     10 yrs ago    WISDOM TOOTH EXTRACTION     Social History   Socioeconomic History   Marital status: Married    Spouse name: Not on file   Number of children: 1   Years of education: 16   Highest education level: Bachelor's degree (e.g., BA, AB, BS)  Occupational History   Not on file  Tobacco Use   Smoking status: Never   Smokeless tobacco: Former    Quit date: 1984  Vaping Use   Vaping status: Never Used  Substance and Sexual Activity   Alcohol use: Not  Currently   Drug use: No   Sexual activity: Not Currently  Other Topics Concern   Not on file  Social History Narrative   Right handed   Lives with wife   One story home   Currently not working July 7th will resume work   Social Drivers of Health   Tobacco Use: Medium Risk (09/21/2024)   Patient History    Smoking Tobacco Use: Never    Smokeless Tobacco Use: Former    Passive Exposure: Not on Actuary Strain: Low Risk (07/29/2024)   Overall Financial Resource Strain (CARDIA)    Difficulty of Paying Living Expenses: Not very hard  Food Insecurity: No  Food Insecurity (07/29/2024)   Epic    Worried About Radiation Protection Practitioner of Food in the Last Year: Never true    Ran Out of Food in the Last Year: Never true  Transportation Needs: No Transportation Needs (07/29/2024)   Epic    Lack of Transportation (Medical): No    Lack of Transportation (Non-Medical): No  Physical Activity: Sufficiently Active (07/29/2024)   Exercise Vital Sign    Days of Exercise per Week: 5 days    Minutes of Exercise per Session: 30 min  Stress: Stress Concern Present (07/29/2024)   [employer] of Occupational Health - Occupational Stress Questionnaire    Feeling of Stress: Rather much  Social Connections: Socially Integrated (07/29/2024)   Social Connection and Isolation Panel    Frequency of Communication with Friends and Family: More than three times a week    Frequency of Social Gatherings with Friends and Family: Three times a week    Attends Religious Services: More than 4 times per year    Active Member of Clubs or Organizations: Yes    Attends Banker Meetings: More than 4 times per year    Marital Status: Married  Depression (PHQ2-9): Low Risk (08/02/2024)   Depression (PHQ2-9)    PHQ-2 Score: 0  Alcohol Screen: Low Risk (07/29/2024)   Alcohol Screen    Last Alcohol Screening Score (AUDIT): 1  Housing: Low Risk (07/29/2024)   Epic    Unable to Pay for Housing in the Last Year: No    Number of Times Moved in the Last Year: 0    Homeless in the Last Year: No  Utilities: Not At Risk (07/30/2023)   AHC Utilities    Threatened with loss of utilities: No  Health Literacy: Not on file   Allergies[1] Family History  Problem Relation Age of Onset   Allergic rhinitis Mother    Cancer Mother    Endometrial cancer Mother    Cancer Father        Lung CA   Hyperlipidemia Father    Hypertension Father    Lung cancer Father    Allergic rhinitis Sister    Allergic rhinitis Brother    Stomach cancer Maternal Grandfather    Colon cancer Neg  Hx    Colon polyps Neg Hx    Esophageal cancer Neg Hx    Rectal cancer Neg Hx     Current Outpatient Medications (Respiratory):    azelastine  (ASTELIN ) 0.1 % nasal spray, Place 2 sprays into both nostrils 2 (two) times daily.   diphenhydrAMINE  (BENADRYL ) 25 mg capsule, Take 25-50 mg by mouth at bedtime as needed for sleep or allergies.   fexofenadine  (ALLEGRA ) 180 MG tablet, Take 1 tablet (180 mg total) by mouth daily.   guaiFENesin (MUCINEX) 600 MG 12 hr tablet, Take 600  mg by mouth 2 (two) times daily as needed for cough or to loosen phlegm.  Current Outpatient Medications (Analgesics):    aspirin EC 81 MG tablet, Take 81 mg by mouth daily.  Current Outpatient Medications (Other):    b complex vitamins tablet, Take 1 tablet by mouth daily.   Coenzyme Q10 (COQ-10 PO), Take 1 capsule by mouth daily.   Collagen Hydrolysate POWD, Take 1 Scoop by mouth daily at 6 (six) AM. Collagen powder   Magnesium 250 MG TABS, Take 250 mg by mouth daily.   omeprazole  (PRILOSEC) 40 MG capsule, Take 1 capsule (40 mg total) by mouth daily. (Patient taking differently: Take 20 mg by mouth daily.)   Reviewed prior external information including notes and imaging from  primary care provider As well as notes that were available from care everywhere and other healthcare systems.  Past medical history, social, surgical and family history all reviewed in electronic medical record.  No pertanent information unless stated regarding to the chief complaint.   Review of Systems:  No headache, visual changes, nausea, vomiting, diarrhea, constipation, dizziness, abdominal pain, skin rash, fevers, chills, night sweats, weight loss, swollen lymph nodes, body aches, joint swelling, chest pain, shortness of breath, mood changes. POSITIVE muscle aches  Objective  Blood pressure 118/82, pulse (!) 55, height 5' 9 (1.753 m), weight 184 lb (83.5 kg), SpO2 91%.   General: No apparent distress alert and oriented x3 mood and  affect normal, dressed appropriately.  HEENT: Pupils equal, extraocular movements intact  Respiratory: Patient's speak in full sentences and does not appear short of breath  Cardiovascular: No lower extremity edema, non tender, no erythema  Still scapular dyskinesis noted.  Seems to be right greater than left.  No midline tenderness.  Osteopathic findings C2 flexed rotated and side bent right C4 flexed rotated and side bent left C6 flexed rotated and side bent left T3 extended rotated and side bent right inhaled third rib T9 extended rotated and side bent left T11 extended rotated and side bent right L2 flexed rotated and side bent right Sacrum right on right     Impression and Recommendations:    Scapular dyskinesis Attempted some osteopathic manipulation today after further evaluation.  Discussed with patient to continue development and strengthening.  We discussed otherwise reviewed consider more advanced imaging but I think would be highly unlikely for anything else to be contributing to his discomfort.  Likely nothing is stopping patient from activity at the moment.  Follow-up again in 6 to 12 weeks    Decision today to treat with OMT was based on Physical Exam  After verbal consent patient was treated with HVLA, ME, FPR techniques in cervical, thoracic, rib, lumbar and sacral areas, all areas are chronic   Patient tolerated the procedure well with improvement in symptoms  Patient given exercises, stretches and lifestyle modifications  See medications in patient instructions if given  Patient will follow up in 4-8 weeks   The above documentation has been reviewed and is accurate and complete Arthea CHRISTELLA Sharps, DO       [1]  Allergies Allergen Reactions   Nsaids Other (See Comments)    BLEEDING/ULCER   Lidocaine -Menthol Rash    Only the patches   Prednisone  Palpitations   "

## 2024-10-05 ENCOUNTER — Encounter: Payer: Self-pay | Admitting: Internal Medicine

## 2024-10-05 ENCOUNTER — Ambulatory Visit (AMBULATORY_SURGERY_CENTER): Admitting: Internal Medicine

## 2024-10-05 ENCOUNTER — Encounter: Payer: Self-pay | Admitting: Family

## 2024-10-05 ENCOUNTER — Ambulatory Visit (INDEPENDENT_AMBULATORY_CARE_PROVIDER_SITE_OTHER): Admitting: Family Medicine

## 2024-10-05 VITALS — BP 127/85 | HR 66 | Temp 97.3°F | Resp 14 | Ht 69.0 in | Wt 175.0 lb

## 2024-10-05 VITALS — BP 118/82 | HR 55 | Ht 69.0 in | Wt 184.0 lb

## 2024-10-05 DIAGNOSIS — M9901 Segmental and somatic dysfunction of cervical region: Secondary | ICD-10-CM

## 2024-10-05 DIAGNOSIS — M9903 Segmental and somatic dysfunction of lumbar region: Secondary | ICD-10-CM | POA: Diagnosis not present

## 2024-10-05 DIAGNOSIS — M9902 Segmental and somatic dysfunction of thoracic region: Secondary | ICD-10-CM | POA: Diagnosis not present

## 2024-10-05 DIAGNOSIS — G2589 Other specified extrapyramidal and movement disorders: Secondary | ICD-10-CM | POA: Diagnosis not present

## 2024-10-05 DIAGNOSIS — C49A4 Gastrointestinal stromal tumor of large intestine: Secondary | ICD-10-CM

## 2024-10-05 DIAGNOSIS — M9904 Segmental and somatic dysfunction of sacral region: Secondary | ICD-10-CM

## 2024-10-05 DIAGNOSIS — K2100 Gastro-esophageal reflux disease with esophagitis, without bleeding: Secondary | ICD-10-CM

## 2024-10-05 DIAGNOSIS — R6881 Early satiety: Secondary | ICD-10-CM

## 2024-10-05 DIAGNOSIS — M9908 Segmental and somatic dysfunction of rib cage: Secondary | ICD-10-CM | POA: Diagnosis not present

## 2024-10-05 MED ORDER — SODIUM CHLORIDE 0.9 % IV SOLN: 500.0000 mL | Freq: Once | INTRAVENOUS | Status: DC

## 2024-10-05 MED ORDER — OMEPRAZOLE 40 MG PO CPDR: 40.0000 mg | DELAYED_RELEASE_CAPSULE | Freq: Every day | ORAL | 3 refills | Status: AC

## 2024-10-05 NOTE — Patient Instructions (Signed)
 Discharge instructions given. Handouts on Hiatal Hernia and Esophagitis. Resume previous medications. YOU HAD AN ENDOSCOPIC PROCEDURE TODAY AT THE [facility]:   Refer to the procedure report that was given to you for any specific questions about what was found during the examination.  If the procedure report does not answer your questions, please call your gastroenterologist to clarify.  If you requested that your care partner not be given the details of your procedure findings, then the procedure report has been included in a sealed envelope for you to review at your convenience later.  YOU SHOULD EXPECT: Some feelings of bloating in the abdomen. Passage of more gas than usual.  Walking can help get rid of the air that was put into your GI tract during the procedure and reduce the bloating. If you had a lower endoscopy (such as a colonoscopy or flexible sigmoidoscopy) you may notice spotting of blood in your stool or on the toilet paper. If you underwent a bowel prep for your procedure, you may not have a normal bowel movement for a few days.  Please Note:  You might notice some irritation and congestion in your nose or some drainage.  This is from the oxygen used during your procedure.  There is no need for concern and it should clear up in a day or so.  SYMPTOMS TO REPORT IMMEDIATELY:   Following upper endoscopy (EGD)  Vomiting of blood or coffee ground material  New chest pain or pain under the shoulder blades  Painful or persistently difficult swallowing  New shortness of breath  Fever of 100F or higher  Black, tarry-looking stools  For urgent or emergent issues, a gastroenterologist can be reached at any hour by calling [phone]. Do not use MyChart messaging for urgent concerns.    DIET:  We do recommend a small meal at first, but then you may proceed to your regular diet.  Drink plenty of fluids but you should avoid alcoholic beverages for 24 hours.  ACTIVITY:   You should plan to take it easy for the rest of today and you should NOT DRIVE or use heavy machinery until tomorrow (because of the sedation medicines used during the test).    FOLLOW UP: Our staff will call the number listed on your records the next business day following your procedure.  We will call around 7:15- 8:00 am to check on you and address any questions or concerns that you may have regarding the information given to you following your procedure. If we do not reach you, we will leave a message.     If any biopsies were taken you will be contacted by phone or by letter within the next 1-3 weeks.  Please call us  at [phone] if you have not heard about the biopsies in 3 weeks.    SIGNATURES/CONFIDENTIALITY: You and/or your care partner have signed paperwork which will be entered into your electronic medical record.  These signatures attest to the fact that that the information above on your After Visit Summary has been reviewed and is understood.  Full responsibility of the confidentiality of this discharge information lies with you and/or your care-partner.

## 2024-10-05 NOTE — Assessment & Plan Note (Signed)
 Attempted some osteopathic manipulation today after further evaluation.  Discussed with patient to continue development and strengthening.  We discussed otherwise reviewed consider more advanced imaging but I think would be highly unlikely for anything else to be contributing to his discomfort.  Likely nothing is stopping patient from activity at the moment.  Follow-up again in 6 to 12 weeks

## 2024-10-05 NOTE — Patient Instructions (Signed)
 Good to see you. Keep working on the exercises. Coop pillow. Follow up in 2 to 3 months.

## 2024-10-05 NOTE — Progress Notes (Signed)
 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  The nature of the procedure, as well as the risks, benefits, and alternatives were carefully and thoroughly reviewed with the patient. Ample time for discussion and questions allowed.  All questions were answered. The patient understood, was satisfied, and agreed with the plan to proceed.    HPI: Tyler Obrien is a 63 y.o. male who presents for EGD.  Medical history as below. No recent chest pain or shortness of breath.  No abdominal pain today.  Past Medical History:  Diagnosis Date   Allergy     Anemia    Arthritis    Bleeding gastric ulcer    Complication of anesthesia    CVA (cerebral infarction) 2011   Diverticulosis    Dysplastic nevus 08/05/2024   right lower back - severe atypia - Ex needed    GERD (gastroesophageal reflux disease)    prilosec as needed- upset stomach mostly - started after gastric bleeding ulcer   Head concussion    History of blood transfusion 1992   gi bleed   History of kidney stones    Stroke [facility]) 2011    Past Surgical History:  Procedure Laterality Date   APPENDECTOMY     BIOPSY  05/29/2023   Procedure: BIOPSY;  Surgeon: [name], MD;  Location: [facility];  Service: Gastroenterology;;   COLONOSCOPY     10 yrs ago    cyst removal from scrotum     cyst removed from elbow     CYSTOSCOPY/URETEROSCOPY/HOLMIUM LASER/STENT PLACEMENT Left 11/20/2022   Procedure: LEFT URETEROSCOPY/HOLMIUM LASER/STENT PLACEMENT;  Surgeon: [name], MD;  Location: [facility];  Service: Urology;  Laterality: Left;   ESOPHAGOGASTRODUODENOSCOPY N/A 05/29/2023   Procedure: ESOPHAGOGASTRODUODENOSCOPY (EGD);  Surgeon: [name], MD;  Location: [facility];  Service:  Gastroenterology;  Laterality: N/A;   EUS N/A 05/29/2023   Procedure: UPPER ENDOSCOPIC ULTRASOUND (EUS) RADIAL;  Surgeon: [name], MD;  Location: [facility];  Service: Gastroenterology;  Laterality: N/A;   FINE NEEDLE ASPIRATION N/A 05/29/2023   Procedure: FINE NEEDLE ASPIRATION (FNA) LINEAR;  Surgeon: [name], MD;  Location: [facility];  Service: Gastroenterology;  Laterality: N/A;   FINGER FRACTURE SURGERY Right    5th finger   KIDNEY STONE SURGERY     LAPAROSCOPIC GASTRECTOMY N/A 07/30/2023   Procedure: LAPAROSCOPIC PARTIAL GASTRECTOMY;  Surgeon: [name], MD;  Location: [facility];  Service: General;  Laterality: N/A;  HARMONIC SCALPEL ECHELON STAPLER   NASAL SINUS SURGERY  08/2009   OPEN ANTERIOR SHOULDER RECONSTRUCTION     UPPER GASTROINTESTINAL ENDOSCOPY     10 yrs ago    WISDOM TOOTH EXTRACTION      Prior to Admission medications  Medication Sig Start Date End Date Taking? Authorizing Provider  aspirin EC 81 MG tablet Take 81 mg by mouth daily.   Yes [provider]  Coenzyme Q10 (COQ-10 PO) Take 1 capsule by mouth daily.   Yes [provider]  Collagen Hydrolysate POWD Take 1 Scoop by mouth daily at 6 (six) AM. Collagen powder   Yes [provider]  fexofenadine  (ALLEGRA ) 180 MG tablet Take 1 tablet (180 mg total) by mouth daily. 06/10/24  Yes Jeneal Danita Macintosh, MD  Magnesium 250  MG TABS Take 250 mg by mouth daily.   Yes [provider]  olopatadine  (PATADAY ) 0.1 % ophthalmic solution Place 1 drop into both eyes daily.   Yes [provider]  omeprazole  (PRILOSEC) 40 MG capsule Take 1 capsule (40 mg total) by mouth daily. Patient taking differently: Take 20 mg by mouth daily. 09/16/23  Yes Sohaib Vereen, Gordy HERO, MD  azelastine  (ASTELIN ) 0.1 % nasal spray Place 2 sprays into both nostrils 2 (two) times daily. 06/10/24   Jeneal Danita Macintosh, MD  b complex vitamins tablet Take 1 tablet by mouth daily.     [provider]  diphenhydrAMINE  (BENADRYL ) 25 mg capsule Take 25-50 mg by mouth at bedtime as needed for sleep or allergies.    [provider]  guaiFENesin (MUCINEX) 600 MG 12 hr tablet Take 600 mg by mouth 2 (two) times daily as needed for cough or to loosen phlegm.    [provider]    Current Outpatient Medications  Medication Sig Dispense Refill   aspirin EC 81 MG tablet Take 81 mg by mouth daily.     Coenzyme Q10 (COQ-10 PO) Take 1 capsule by mouth daily.     Collagen Hydrolysate POWD Take 1 Scoop by mouth daily at 6 (six) AM. Collagen powder     fexofenadine  (ALLEGRA ) 180 MG tablet Take 1 tablet (180 mg total) by mouth daily. 30 tablet 3   Magnesium 250 MG TABS Take 250 mg by mouth daily.     olopatadine  (PATADAY ) 0.1 % ophthalmic solution Place 1 drop into both eyes daily.     omeprazole  (PRILOSEC) 40 MG capsule Take 1 capsule (40 mg total) by mouth daily. (Patient taking differently: Take 20 mg by mouth daily.) 90 capsule 3   azelastine  (ASTELIN ) 0.1 % nasal spray Place 2 sprays into both nostrils 2 (two) times daily. 30 mL 3   b complex vitamins tablet Take 1 tablet by mouth daily.     diphenhydrAMINE  (BENADRYL ) 25 mg capsule Take 25-50 mg by mouth at bedtime as needed for sleep or allergies.     guaiFENesin (MUCINEX) 600 MG 12 hr tablet Take 600 mg by mouth 2 (two) times daily as needed for cough or to loosen phlegm.     Current Facility-Administered Medications  Medication Dose Route Frequency Provider Last Rate Last Admin   0.9 %  sodium chloride  infusion  500 mL Intravenous Once Job Holtsclaw, Gordy HERO, MD        Allergies as of 10/05/2024 - Review Complete 10/05/2024  Allergen Reaction Noted   Nsaids Other (See Comments) 11/22/2016   Lidocaine -menthol Rash 12/01/2022   Prednisone  Palpitations 05/15/2023    Family History  Problem Relation Age of Onset   Allergic rhinitis Mother    Cancer Mother    Endometrial cancer Mother    Cancer Father         Lung CA   Hyperlipidemia Father    Hypertension Father    Lung cancer Father    Allergic rhinitis Sister    Allergic rhinitis Brother    Stomach cancer Maternal Grandfather    Colon cancer Neg Hx    Colon polyps Neg Hx    Esophageal cancer Neg Hx    Rectal cancer Neg Hx     Social History   Socioeconomic History   Marital status: Married    Spouse name: Not on file   Number of children: 1   Years of education: 16   Highest education level: Bachelor's degree (e.g.,  BA, AB, BS)  Occupational History   Not on file  Tobacco Use   Smoking status: Never   Smokeless tobacco: Former    Quit date: 1984  Vaping Use   Vaping status: Never Used  Substance and Sexual Activity   Alcohol use: Not Currently   Drug use: No   Sexual activity: Not Currently  Other Topics Concern   Not on file  Social History Narrative   Right handed   Lives with wife   One story home   Currently not working July 7th will resume work   Social Drivers of Health   Tobacco Use: Medium Risk (10/05/2024)   Patient History    Smoking Tobacco Use: Never    Smokeless Tobacco Use: Former    Passive Exposure: Not on Actuary Strain: Low Risk (07/29/2024)   Overall Financial Resource Strain (CARDIA)    Difficulty of Paying Living Expenses: Not very hard  Food Insecurity: No Food Insecurity (07/29/2024)   Epic    Worried About Radiation Protection Practitioner of Food in the Last Year: Never true    Ran Out of Food in the Last Year: Never true  Transportation Needs: No Transportation Needs (07/29/2024)   Epic    Lack of Transportation (Medical): No    Lack of Transportation (Non-Medical): No  Physical Activity: Sufficiently Active (07/29/2024)   Exercise Vital Sign    Days of Exercise per Week: 5 days    Minutes of Exercise per Session: 30 min  Stress: Stress Concern Present (07/29/2024)   [employer] of Occupational Health - Occupational Stress Questionnaire    Feeling of Stress: Rather much   Social Connections: Socially Integrated (07/29/2024)   Social Connection and Isolation Panel    Frequency of Communication with Friends and Family: More than three times a week    Frequency of Social Gatherings with Friends and Family: Three times a week    Attends Religious Services: More than 4 times per year    Active Member of Clubs or Organizations: Yes    Attends Banker Meetings: More than 4 times per year    Marital Status: Married  Catering Manager Violence: Not At Risk (07/30/2023)   Humiliation, Afraid, Rape, and Kick questionnaire    Fear of Current or Ex-Partner: No    Emotionally Abused: No    Physically Abused: No    Sexually Abused: No  Depression (PHQ2-9): Low Risk (08/02/2024)   Depression (PHQ2-9)    PHQ-2 Score: 0  Alcohol Screen: Low Risk (07/29/2024)   Alcohol Screen    Last Alcohol Screening Score (AUDIT): 1  Housing: Low Risk (07/29/2024)   Epic    Unable to Pay for Housing in the Last Year: No    Number of Times Moved in the Last Year: 0    Homeless in the Last Year: No  Utilities: Not At Risk (07/30/2023)   [employer] Utilities    Threatened with loss of utilities: No  Health Literacy: Not on file    Physical Exam: Vital signs in last 24 hours: @BP  (!) 153/95   Pulse 67   Temp (!) 97.3 F (36.3 C) (Temporal)   Ht 5' 9 (1.753 m)   Wt 175 lb (79.4 kg)   SpO2 98%   BMI 25.84 kg/m  GEN: NAD EYE: Sclerae anicteric ENT: MMM CV: Non-tachycardic Pulm: CTA b/l GI: Soft, NT/ND NEURO:  Alert & Oriented x 3   Gordy Starch, MD [facility]  10/05/2024 1:17 PM  "

## 2024-10-05 NOTE — Op Note (Signed)
 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 The patient's tolerance of                            previous anesthesia was also reviewed. The risks                            and benefits of the procedure and the sedation                            options and risks were discussed with the patient.                            All questions were answered, and informed consent                            was obtained. Prior Anticoagulants: The patient has                            taken no anticoagulant or antiplatelet agents. ASA                            Grade Assessment: II - A patient with mild systemic                            disease. After reviewing the risks and benefits,  the patient was deemed in satisfactory condition to                            undergo the procedure.                           After obtaining informed consent, the endoscope was                            passed under direct vision. Throughout the                            procedure, the patient's blood pressure, pulse, and                            oxygen saturations  were monitored continuously. The                            Endoscope was introduced through the mouth, and                            advanced to the area of papilla. The upper GI                            endoscopy was accomplished without difficulty. The                            patient tolerated the procedure well. Scope In: Scope Out: Findings:                 LA Grade A (one or more mucosal breaks less than 5                            mm, not extending between tops of 2 mucosal folds)                            esophagitis with no bleeding was found at the                            gastroesophageal junction.                           A 1 cm hiatal hernia was present.                           Evidence of a previous surgical resection was found                            in the gastric body (lesser curvature). This was                            characterized by healthy appearing mucosa.                           The  exam of the stomach was otherwise normal.                           The examined duodenum was normal. Complications:            No immediate complications. Estimated Blood Loss:     Estimated blood loss: none. Impression:               - LA Grade A reflux esophagitis with no bleeding.                           - 1 cm hiatal hernia.                           - A previous surgical resection was found on the                            lesser curvature of the stomach, characterized by                            healthy appearing mucosa.                           - Normal examined duodenum.                           - No specimens collected. Recommendation:           - Patient has a contact number available for                            emergencies. The signs and symptoms of potential                            delayed complications were discussed with the                            patient. Return to normal activities tomorrow.                            Written  discharge instructions were provided to the                            patient.                           - Resume previous diet.                           - Continue present medications. Would increase                            omeprazole  from 20 mg to 40 mg daily, versus trial                            of different PPI. Reflux and early satiety likely  related to previous surgery in proximal stomach,                            which is expected to improve over time. Gordy CHRISTELLA Starch, MD 10/05/2024 1:32:50 PM This report has been signed electronically.

## 2024-10-05 NOTE — Progress Notes (Signed)
 Sedate, gd SR, tolerated procedure well, VSS, report to RN

## 2024-10-08 ENCOUNTER — Telehealth: Payer: Self-pay

## 2024-10-08 NOTE — Telephone Encounter (Signed)
" °  Follow up Call-     10/05/2024   12:47 PM 10/22/2022    1:05 PM  Call back number  Post procedure Call Back phone  # [phone]  Permission to leave phone message Yes Yes    Attempted to call patient regarding follow-up, no answer. VM left with instructions to call back with any concerns.  "

## 2024-10-21 ENCOUNTER — Encounter: Payer: Self-pay | Admitting: Dermatology

## 2024-10-27 ENCOUNTER — Encounter: Admitting: Dermatology

## 2024-11-02 ENCOUNTER — Encounter: Payer: Self-pay | Admitting: Medical

## 2024-11-02 NOTE — Telephone Encounter (Signed)
 Called pt but no answer detailed vm left for pt to call back and request 40 min visit. Will also write pt via mychart

## 2024-11-05 ENCOUNTER — Ambulatory Visit: Attending: Medical

## 2024-11-05 ENCOUNTER — Ambulatory Visit: Admitting: Medical

## 2024-11-05 ENCOUNTER — Ambulatory Visit: Payer: Self-pay | Admitting: Medical

## 2024-11-05 VITALS — BP 124/82 | HR 84 | Temp 97.9°F | Resp 14 | Ht 69.0 in | Wt 183.4 lb

## 2024-11-05 DIAGNOSIS — R000 Tachycardia, unspecified: Secondary | ICD-10-CM

## 2024-11-05 DIAGNOSIS — R0600 Dyspnea, unspecified: Secondary | ICD-10-CM

## 2024-11-05 DIAGNOSIS — R0789 Other chest pain: Secondary | ICD-10-CM

## 2024-11-05 LAB — TROPONIN I (HIGH SENSITIVITY): High Sens Troponin I: <2 ng/L (ref 2–17)

## 2024-11-05 NOTE — Progress Notes (Unsigned)
 EP to read.

## 2024-11-05 NOTE — Patient Instructions (Addendum)
 Tachycardia and exertional symptoms (low level atypical chest pressure with some radiation to left shoulder at times.) Shoulder pain maybe msk but hard to say definitively. -Intermittent tachycardia with heart rates up to 149 bpm during minimal exertion. Symptoms include fatigue, low exercise tolerance, and occasional low-level chest discomfort. No atrial fibrillation on previous Zio patch. Differential includes cardiac causes versus musculoskeletal pain. Recent symptom more msk/cartlidge like pain but some concern  low level chest pain possible cardiac etiology. - Contacted Dr. Bernie to discuss ongoing tachycardia and potential need for another Zio patch. - Consider low-dose rate-controlling  beta blocker medication regular use if recommended by cardiologist. - after talking with Dr. MARLA cardiologist and going over EKG, records and 4 hours since atpical low level pain he states can just get one troponin. That being said if worse or change in symptoms over weekend then be seen in the ED -order new ziopatch. And will follow up with cardiologist  Follow up with Dr. Krasowksi after ziopatch or sooner with him or me if needed

## 2024-11-12 ENCOUNTER — Encounter: Payer: Self-pay | Admitting: Medical

## 2024-11-22 ENCOUNTER — Encounter: Admitting: Dermatology

## 2024-12-14 ENCOUNTER — Ambulatory Visit: Admitting: Cardiology

## 2024-12-30 ENCOUNTER — Ambulatory Visit: Admitting: Allergy

## 2025-01-10 ENCOUNTER — Ambulatory Visit: Admitting: Family Medicine

## 2025-03-15 ENCOUNTER — Ambulatory Visit: Admitting: Medical Oncology

## 2025-03-15 ENCOUNTER — Inpatient Hospital Stay

## 2025-08-04 ENCOUNTER — Ambulatory Visit: Admitting: Dermatology
